# Patient Record
Sex: Female | Born: 1980
Health system: Southern US, Community
[De-identification: ages and names within clinical notes are randomized; demographics above are authoritative.]

## PROBLEM LIST (undated history)

## (undated) DIAGNOSIS — N200 Calculus of kidney: Secondary | ICD-10-CM

## (undated) DIAGNOSIS — K219 Gastro-esophageal reflux disease without esophagitis: Secondary | ICD-10-CM

## (undated) DIAGNOSIS — T4145XA Adverse effect of unspecified anesthetic, initial encounter: Secondary | ICD-10-CM

## (undated) DIAGNOSIS — Z86010 Personal history of colonic polyps: Secondary | ICD-10-CM

## (undated) DIAGNOSIS — Z800 Family history of malignant neoplasm of digestive organs: Secondary | ICD-10-CM

## (undated) DIAGNOSIS — Z9889 Other specified postprocedural states: Secondary | ICD-10-CM

## (undated) DIAGNOSIS — T8859XA Other complications of anesthesia, initial encounter: Secondary | ICD-10-CM

## (undated) DIAGNOSIS — M797 Fibromyalgia: Secondary | ICD-10-CM

## (undated) DIAGNOSIS — E538 Deficiency of other specified B group vitamins: Secondary | ICD-10-CM

## (undated) DIAGNOSIS — F988 Other specified behavioral and emotional disorders with onset usually occurring in childhood and adolescence: Secondary | ICD-10-CM

## (undated) DIAGNOSIS — R112 Nausea with vomiting, unspecified: Secondary | ICD-10-CM

## (undated) DIAGNOSIS — R5383 Other fatigue: Secondary | ICD-10-CM

## (undated) HISTORY — DX: Family history of malignant neoplasm of digestive organs: Z80.0

## (undated) HISTORY — PX: WISDOM TOOTH EXTRACTION: SHX21

## (undated) HISTORY — DX: Gastro-esophageal reflux disease without esophagitis: K21.9

## (undated) HISTORY — PX: REDUCTION MAMMAPLASTY: SUR839

## (undated) HISTORY — DX: Other fatigue: R53.83

## (undated) HISTORY — DX: Personal history of colonic polyps: Z86.010

## (undated) HISTORY — PX: ABDOMINAL HYSTERECTOMY: SHX81

## (undated) HISTORY — PX: CHOLECYSTECTOMY: SHX55

## (undated) HISTORY — DX: Deficiency of other specified B group vitamins: E53.8

## (undated) HISTORY — DX: Calculus of kidney: N20.0

## (undated) HISTORY — PX: OVARIAN CYST SURGERY: SHX726

---
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
## 1999-07-06 ENCOUNTER — Other Ambulatory Visit: Admission: RE | Admit: 1999-07-06 | Discharge: 1999-07-06 | Payer: Self-pay | Admitting: Family Medicine

## 2000-06-24 ENCOUNTER — Other Ambulatory Visit: Admission: RE | Admit: 2000-06-24 | Discharge: 2000-06-24 | Payer: Self-pay | Admitting: Family Medicine

## 2001-08-25 ENCOUNTER — Encounter: Payer: Self-pay | Admitting: Family Medicine

## 2001-08-25 LAB — CONVERTED CEMR LAB

## 2002-01-22 ENCOUNTER — Encounter (INDEPENDENT_AMBULATORY_CARE_PROVIDER_SITE_OTHER): Payer: Self-pay | Admitting: Specialist

## 2002-01-22 ENCOUNTER — Ambulatory Visit (HOSPITAL_BASED_OUTPATIENT_CLINIC_OR_DEPARTMENT_OTHER): Admission: RE | Admit: 2002-01-22 | Discharge: 2002-01-22 | Payer: Self-pay | Admitting: Plastic Surgery

## 2002-01-25 HISTORY — PX: BREAST SURGERY: SHX581

## 2003-03-21 ENCOUNTER — Other Ambulatory Visit: Admission: RE | Admit: 2003-03-21 | Discharge: 2003-03-21 | Payer: Self-pay | Admitting: Obstetrics & Gynecology

## 2003-06-27 ENCOUNTER — Emergency Department (HOSPITAL_COMMUNITY): Admission: EM | Admit: 2003-06-27 | Discharge: 2003-06-27 | Payer: Self-pay | Admitting: Emergency Medicine

## 2003-12-31 ENCOUNTER — Ambulatory Visit: Payer: Self-pay | Admitting: Family Medicine

## 2004-01-15 ENCOUNTER — Ambulatory Visit: Payer: Self-pay | Admitting: Family Medicine

## 2004-02-14 ENCOUNTER — Ambulatory Visit: Payer: Self-pay | Admitting: Family Medicine

## 2004-03-31 ENCOUNTER — Ambulatory Visit: Payer: Self-pay | Admitting: Family Medicine

## 2004-05-21 ENCOUNTER — Ambulatory Visit: Payer: Self-pay | Admitting: Family Medicine

## 2004-06-23 ENCOUNTER — Ambulatory Visit: Payer: Self-pay | Admitting: Internal Medicine

## 2004-07-23 ENCOUNTER — Ambulatory Visit: Payer: Self-pay | Admitting: Family Medicine

## 2004-08-17 ENCOUNTER — Ambulatory Visit: Payer: Self-pay | Admitting: Family Medicine

## 2004-10-15 ENCOUNTER — Other Ambulatory Visit: Admission: RE | Admit: 2004-10-15 | Discharge: 2004-10-15 | Payer: Self-pay | Admitting: Internal Medicine

## 2004-10-15 ENCOUNTER — Ambulatory Visit: Payer: Self-pay | Admitting: Family Medicine

## 2004-10-20 ENCOUNTER — Ambulatory Visit: Payer: Self-pay | Admitting: Family Medicine

## 2004-11-25 HISTORY — PX: LAPAROSCOPY: SHX197

## 2004-12-04 ENCOUNTER — Ambulatory Visit (HOSPITAL_COMMUNITY): Admission: RE | Admit: 2004-12-04 | Discharge: 2004-12-04 | Payer: Self-pay | Admitting: Gynecology

## 2005-01-23 ENCOUNTER — Ambulatory Visit: Payer: Self-pay | Admitting: Family Medicine

## 2005-02-22 ENCOUNTER — Ambulatory Visit: Payer: Self-pay | Admitting: Family Medicine

## 2005-03-08 ENCOUNTER — Ambulatory Visit: Payer: Self-pay | Admitting: Family Medicine

## 2005-05-17 ENCOUNTER — Ambulatory Visit: Payer: Self-pay | Admitting: Family Medicine

## 2005-05-31 ENCOUNTER — Ambulatory Visit: Payer: Self-pay | Admitting: Family Medicine

## 2005-09-07 ENCOUNTER — Ambulatory Visit: Payer: Self-pay | Admitting: Family Medicine

## 2005-11-10 ENCOUNTER — Ambulatory Visit: Payer: Self-pay | Admitting: Family Medicine

## 2006-01-25 HISTORY — PX: TUBAL LIGATION: SHX77

## 2006-01-31 ENCOUNTER — Ambulatory Visit: Payer: Self-pay | Admitting: Family Medicine

## 2006-02-25 ENCOUNTER — Ambulatory Visit: Payer: Self-pay | Admitting: Family Medicine

## 2006-03-15 ENCOUNTER — Ambulatory Visit: Payer: Self-pay | Admitting: Family Medicine

## 2006-03-15 ENCOUNTER — Encounter: Payer: Self-pay | Admitting: Family Medicine

## 2006-03-23 ENCOUNTER — Ambulatory Visit: Payer: Self-pay | Admitting: Family Medicine

## 2006-03-24 ENCOUNTER — Encounter: Payer: Self-pay | Admitting: Family Medicine

## 2006-03-30 ENCOUNTER — Ambulatory Visit: Payer: Self-pay | Admitting: Family Medicine

## 2006-04-02 ENCOUNTER — Ambulatory Visit: Payer: Self-pay | Admitting: Family Medicine

## 2006-04-26 LAB — HM COLONOSCOPY

## 2006-04-27 ENCOUNTER — Encounter: Payer: Self-pay | Admitting: Family Medicine

## 2006-04-27 DIAGNOSIS — N809 Endometriosis, unspecified: Secondary | ICD-10-CM | POA: Insufficient documentation

## 2006-04-27 DIAGNOSIS — F17200 Nicotine dependence, unspecified, uncomplicated: Secondary | ICD-10-CM | POA: Insufficient documentation

## 2006-04-27 DIAGNOSIS — R1013 Epigastric pain: Secondary | ICD-10-CM

## 2006-04-27 DIAGNOSIS — G43909 Migraine, unspecified, not intractable, without status migrainosus: Secondary | ICD-10-CM | POA: Insufficient documentation

## 2006-04-27 DIAGNOSIS — Z87442 Personal history of urinary calculi: Secondary | ICD-10-CM | POA: Insufficient documentation

## 2006-04-27 DIAGNOSIS — Z87898 Personal history of other specified conditions: Secondary | ICD-10-CM | POA: Insufficient documentation

## 2006-04-27 DIAGNOSIS — K3189 Other diseases of stomach and duodenum: Secondary | ICD-10-CM | POA: Insufficient documentation

## 2006-04-27 DIAGNOSIS — K219 Gastro-esophageal reflux disease without esophagitis: Secondary | ICD-10-CM | POA: Insufficient documentation

## 2006-05-02 ENCOUNTER — Ambulatory Visit: Payer: Self-pay | Admitting: Unknown Physician Specialty

## 2006-05-02 ENCOUNTER — Encounter: Payer: Self-pay | Admitting: Gastroenterology

## 2006-05-16 ENCOUNTER — Encounter: Payer: Self-pay | Admitting: Family Medicine

## 2006-06-01 DIAGNOSIS — Z860100 Personal history of colon polyps, unspecified: Secondary | ICD-10-CM

## 2006-06-01 HISTORY — DX: Personal history of colon polyps, unspecified: Z86.0100

## 2006-06-01 HISTORY — DX: Personal history of colonic polyps: Z86.010

## 2006-06-13 ENCOUNTER — Ambulatory Visit: Payer: Self-pay | Admitting: Family Medicine

## 2006-08-12 ENCOUNTER — Telehealth: Payer: Self-pay | Admitting: Family Medicine

## 2006-08-15 ENCOUNTER — Ambulatory Visit: Payer: Self-pay | Admitting: Gynecology

## 2006-08-16 ENCOUNTER — Ambulatory Visit (HOSPITAL_COMMUNITY): Admission: RE | Admit: 2006-08-16 | Discharge: 2006-08-16 | Payer: Self-pay | Admitting: Gynecology

## 2006-10-17 ENCOUNTER — Ambulatory Visit: Payer: Self-pay | Admitting: Gynecology

## 2006-10-26 ENCOUNTER — Ambulatory Visit: Payer: Self-pay | Admitting: Family Medicine

## 2006-10-27 LAB — CONVERTED CEMR LAB
ALT: 12 units/L (ref 0–35)
Albumin: 3.7 g/dL (ref 3.5–5.2)
Alkaline Phosphatase: 27 units/L — ABNORMAL LOW (ref 39–117)
BUN: 6 mg/dL (ref 6–23)
Basophils Relative: 0.3 % (ref 0.0–1.0)
CO2: 27 meq/L (ref 19–32)
Calcium: 9 mg/dL (ref 8.4–10.5)
Chloride: 107 meq/L (ref 96–112)
Creatinine, Ser: 0.7 mg/dL (ref 0.4–1.2)
Eosinophils Relative: 0.6 % (ref 0.0–5.0)
Folate: 8.7 ng/mL
HCT: 35.4 % — ABNORMAL LOW (ref 36.0–46.0)
MCV: 87 fL (ref 78.0–100.0)
Neutrophils Relative %: 67.6 % (ref 43.0–77.0)
Platelets: 238 10*3/uL (ref 150–400)
RBC: 4.07 M/uL (ref 3.87–5.11)
RDW: 12.1 % (ref 11.5–14.6)
Total Bilirubin: 1 mg/dL (ref 0.3–1.2)
Total Protein: 6.9 g/dL (ref 6.0–8.3)
WBC: 5.8 10*3/uL (ref 4.5–10.5)

## 2006-10-28 ENCOUNTER — Ambulatory Visit: Payer: Self-pay | Admitting: Family Medicine

## 2006-10-28 DIAGNOSIS — E538 Deficiency of other specified B group vitamins: Secondary | ICD-10-CM | POA: Insufficient documentation

## 2006-10-31 ENCOUNTER — Ambulatory Visit: Payer: Self-pay | Admitting: Family Medicine

## 2006-10-31 LAB — CONVERTED CEMR LAB
Bilirubin Urine: NEGATIVE
Blood in Urine, dipstick: NEGATIVE
Nitrite: NEGATIVE
Protein, U semiquant: NEGATIVE
Specific Gravity, Urine: <1.005
WBC Urine, dipstick: NEGATIVE

## 2006-11-01 LAB — CONVERTED CEMR LAB
Basophils Relative: 1 % (ref 0.0–1.0)
Eosinophils Absolute: 0 10*3/uL (ref 0.0–0.6)
Eosinophils Relative: 0.9 % (ref 0.0–5.0)
Hemoglobin: 12.5 g/dL (ref 12.0–15.0)
Lymphocytes Relative: 34.4 % (ref 12.0–46.0)
MCV: 87 fL (ref 78.0–100.0)
Monocytes Absolute: 0.6 10*3/uL (ref 0.2–0.7)
Monocytes Relative: 14.9 % — ABNORMAL HIGH (ref 3.0–11.0)
Neutro Abs: 2 10*3/uL (ref 1.4–7.7)
Platelets: 207 10*3/uL (ref 150–400)
WBC: 4 10*3/uL — ABNORMAL LOW (ref 4.5–10.5)

## 2006-11-02 ENCOUNTER — Encounter: Payer: Self-pay | Admitting: Family Medicine

## 2006-11-04 ENCOUNTER — Ambulatory Visit: Payer: Self-pay | Admitting: Gynecology

## 2006-11-04 ENCOUNTER — Ambulatory Visit (HOSPITAL_COMMUNITY): Admission: RE | Admit: 2006-11-04 | Discharge: 2006-11-04 | Payer: Self-pay | Admitting: Gynecology

## 2006-11-07 ENCOUNTER — Inpatient Hospital Stay (HOSPITAL_COMMUNITY): Admission: AD | Admit: 2006-11-07 | Discharge: 2006-11-07 | Payer: Self-pay | Admitting: Obstetrics & Gynecology

## 2006-12-05 ENCOUNTER — Ambulatory Visit: Payer: Self-pay | Admitting: Family Medicine

## 2006-12-08 ENCOUNTER — Ambulatory Visit: Payer: Self-pay | Admitting: Obstetrics & Gynecology

## 2006-12-09 ENCOUNTER — Telehealth: Payer: Self-pay | Admitting: Family Medicine

## 2007-01-04 ENCOUNTER — Ambulatory Visit: Payer: Self-pay | Admitting: Family Medicine

## 2007-01-31 ENCOUNTER — Ambulatory Visit: Payer: Self-pay | Admitting: Family Medicine

## 2007-01-31 DIAGNOSIS — R350 Frequency of micturition: Secondary | ICD-10-CM | POA: Insufficient documentation

## 2007-02-07 ENCOUNTER — Ambulatory Visit: Payer: Self-pay | Admitting: Family Medicine

## 2007-02-07 ENCOUNTER — Telehealth: Payer: Self-pay | Admitting: Family Medicine

## 2007-02-08 ENCOUNTER — Ambulatory Visit: Payer: Self-pay | Admitting: Family Medicine

## 2007-02-27 ENCOUNTER — Ambulatory Visit: Payer: Self-pay | Admitting: Gynecology

## 2007-02-27 ENCOUNTER — Encounter: Payer: Self-pay | Admitting: Family Medicine

## 2007-02-27 ENCOUNTER — Encounter (INDEPENDENT_AMBULATORY_CARE_PROVIDER_SITE_OTHER): Payer: Self-pay | Admitting: Gynecology

## 2007-02-28 ENCOUNTER — Ambulatory Visit: Payer: Self-pay | Admitting: Family Medicine

## 2007-02-28 LAB — CONVERTED CEMR LAB: Rapid Strep: NEGATIVE

## 2007-03-01 ENCOUNTER — Encounter: Payer: Self-pay | Admitting: Family Medicine

## 2007-03-02 LAB — CONVERTED CEMR LAB
Basophils Absolute: 0 10*3/uL (ref 0.0–0.1)
Eosinophils Absolute: 0 10*3/uL (ref 0.0–0.6)
Hemoglobin: 12.6 g/dL (ref 12.0–15.0)
Lymphocytes Relative: 3.9 % — ABNORMAL LOW (ref 12.0–46.0)
MCHC: 33.9 g/dL (ref 30.0–36.0)
Mono Screen: NEGATIVE
Monocytes Absolute: 0.4 10*3/uL (ref 0.2–0.7)
Monocytes Relative: 0.9 % — ABNORMAL LOW (ref 3.0–11.0)
Neutro Abs: 12.8 10*3/uL — ABNORMAL HIGH (ref 1.4–7.7)
Platelets: 182 10*3/uL (ref 150–400)

## 2007-03-09 ENCOUNTER — Ambulatory Visit: Payer: Self-pay | Admitting: Family Medicine

## 2007-03-20 ENCOUNTER — Ambulatory Visit: Payer: Self-pay | Admitting: Family Medicine

## 2007-04-20 ENCOUNTER — Ambulatory Visit: Payer: Self-pay | Admitting: Family Medicine

## 2007-04-21 LAB — CONVERTED CEMR LAB
Chloride: 105 meq/L (ref 96–112)
Creatinine, Ser: 0.6 mg/dL (ref 0.4–1.2)
Eosinophils Relative: 0.9 % (ref 0.0–5.0)
Folate: 6.8 ng/mL
GFR calc Af Amer: 152 mL/min
GFR calc non Af Amer: 126 mL/min
HCT: 38.4 % (ref 36.0–46.0)
Lymphocytes Relative: 31.3 % (ref 12.0–46.0)
Monocytes Absolute: 0.5 10*3/uL (ref 0.2–0.7)
Monocytes Relative: 7.8 % (ref 3.0–11.0)
Neutrophils Relative %: 59.2 % (ref 43.0–77.0)
Phosphorus: 3.2 mg/dL (ref 2.3–4.6)
Platelets: 258 10*3/uL (ref 150–400)
RDW: 13.1 % (ref 11.5–14.6)
Sodium: 135 meq/L (ref 135–145)
Transferrin: 365.9 mg/dL — ABNORMAL HIGH (ref >=212.0)
Vitamin B-12: >1500 pg/mL — ABNORMAL HIGH (ref 211–911)
WBC: 6 10*3/uL (ref 4.5–10.5)

## 2007-05-24 ENCOUNTER — Ambulatory Visit: Payer: Self-pay | Admitting: Family Medicine

## 2007-05-31 ENCOUNTER — Telehealth: Payer: Self-pay | Admitting: Family Medicine

## 2007-06-23 ENCOUNTER — Ambulatory Visit: Payer: Self-pay | Admitting: Family Medicine

## 2007-07-26 ENCOUNTER — Ambulatory Visit: Payer: Self-pay | Admitting: Family Medicine

## 2007-08-29 ENCOUNTER — Ambulatory Visit: Payer: Self-pay | Admitting: Family Medicine

## 2007-09-14 ENCOUNTER — Ambulatory Visit: Payer: Self-pay | Admitting: Family Medicine

## 2007-09-15 LAB — CONVERTED CEMR LAB
Folate: 10.6 ng/mL
Hemoglobin: 13.1 g/dL (ref 12.0–15.0)

## 2007-10-12 ENCOUNTER — Emergency Department (HOSPITAL_COMMUNITY): Admission: EM | Admit: 2007-10-12 | Discharge: 2007-10-12 | Payer: Self-pay | Admitting: Family Medicine

## 2007-10-14 ENCOUNTER — Telehealth (INDEPENDENT_AMBULATORY_CARE_PROVIDER_SITE_OTHER): Payer: Self-pay | Admitting: *Deleted

## 2007-10-14 ENCOUNTER — Ambulatory Visit: Payer: Self-pay | Admitting: Family Medicine

## 2007-10-16 ENCOUNTER — Encounter: Payer: Self-pay | Admitting: Family Medicine

## 2007-11-17 ENCOUNTER — Ambulatory Visit: Payer: Self-pay | Admitting: Family Medicine

## 2007-11-17 ENCOUNTER — Telehealth: Payer: Self-pay | Admitting: Family Medicine

## 2007-11-21 LAB — CONVERTED CEMR LAB
BUN: 8 mg/dL (ref 6–23)
CO2: 25 meq/L (ref 19–32)
Chloride: 103 meq/L (ref 96–112)
Creatinine, Ser: 0.56 mg/dL (ref 0.40–1.20)
HCT: 37.3 % (ref 36.0–46.0)
Hemoglobin: 12 g/dL (ref 12.0–15.0)
Lymphocytes Relative: 31 % (ref 12–46)
MCHC: 32.2 g/dL (ref 30.0–36.0)
Monocytes Absolute: 0.8 10*3/uL (ref 0.1–1.0)
Monocytes Relative: 11 % (ref 3–12)
Neutro Abs: 3.9 10*3/uL (ref 1.7–7.7)
RBC: 4.21 M/uL (ref 3.87–5.11)

## 2007-12-28 ENCOUNTER — Telehealth (INDEPENDENT_AMBULATORY_CARE_PROVIDER_SITE_OTHER): Payer: Self-pay | Admitting: *Deleted

## 2008-02-06 ENCOUNTER — Ambulatory Visit: Payer: Self-pay | Admitting: Family Medicine

## 2008-02-07 LAB — CONVERTED CEMR LAB
Alkaline Phosphatase: 38 units/L — ABNORMAL LOW (ref 39–117)
Bilirubin, Direct: 0.1 mg/dL (ref 0.0–0.3)
LDL Cholesterol: 98 mg/dL (ref 0–99)
Total Bilirubin: 0.8 mg/dL (ref 0.3–1.2)
Total CHOL/HDL Ratio: 2.8

## 2008-02-14 ENCOUNTER — Ambulatory Visit: Payer: Self-pay | Admitting: Family Medicine

## 2008-02-15 ENCOUNTER — Encounter: Payer: Self-pay | Admitting: Family Medicine

## 2008-02-15 LAB — CONVERTED CEMR LAB
Clue Cells Wet Prep HPF POC: NONE SEEN
GC Probe Amp, Genital: NEGATIVE
WBC, Wet Prep HPF POC: NONE SEEN

## 2008-02-28 ENCOUNTER — Telehealth: Payer: Self-pay | Admitting: Family Medicine

## 2008-02-28 ENCOUNTER — Ambulatory Visit: Payer: Self-pay | Admitting: Family Medicine

## 2008-02-28 ENCOUNTER — Encounter: Payer: Self-pay | Admitting: Family Medicine

## 2008-03-07 ENCOUNTER — Telehealth (INDEPENDENT_AMBULATORY_CARE_PROVIDER_SITE_OTHER): Payer: Self-pay | Admitting: *Deleted

## 2008-03-09 ENCOUNTER — Ambulatory Visit: Payer: Self-pay | Admitting: Family Medicine

## 2008-03-09 DIAGNOSIS — J309 Allergic rhinitis, unspecified: Secondary | ICD-10-CM | POA: Insufficient documentation

## 2008-03-15 ENCOUNTER — Telehealth: Payer: Self-pay | Admitting: Family Medicine

## 2008-03-22 ENCOUNTER — Ambulatory Visit: Payer: Self-pay | Admitting: Internal Medicine

## 2008-04-11 ENCOUNTER — Telehealth: Payer: Self-pay | Admitting: Family Medicine

## 2008-04-19 ENCOUNTER — Encounter: Payer: Self-pay | Admitting: Family Medicine

## 2008-05-06 ENCOUNTER — Ambulatory Visit: Payer: Self-pay | Admitting: Family Medicine

## 2008-05-06 LAB — CONVERTED CEMR LAB: TSH: 2.518 microintl units/mL (ref 0.350–4.500)

## 2008-05-09 ENCOUNTER — Ambulatory Visit (HOSPITAL_COMMUNITY): Admission: RE | Admit: 2008-05-09 | Discharge: 2008-05-09 | Payer: Self-pay | Admitting: Obstetrics & Gynecology

## 2008-05-15 ENCOUNTER — Telehealth: Payer: Self-pay | Admitting: Family Medicine

## 2008-05-15 ENCOUNTER — Inpatient Hospital Stay (HOSPITAL_COMMUNITY): Admission: AD | Admit: 2008-05-15 | Discharge: 2008-05-15 | Payer: Self-pay | Admitting: Obstetrics & Gynecology

## 2008-05-20 ENCOUNTER — Encounter: Admission: RE | Admit: 2008-05-20 | Discharge: 2008-05-20 | Payer: Self-pay | Admitting: Family Medicine

## 2008-06-05 ENCOUNTER — Ambulatory Visit: Payer: Self-pay | Admitting: Obstetrics & Gynecology

## 2008-06-06 ENCOUNTER — Ambulatory Visit: Payer: Self-pay | Admitting: Family Medicine

## 2008-08-14 ENCOUNTER — Ambulatory Visit: Payer: Self-pay | Admitting: Family Medicine

## 2008-08-14 ENCOUNTER — Telehealth (INDEPENDENT_AMBULATORY_CARE_PROVIDER_SITE_OTHER): Payer: Self-pay | Admitting: *Deleted

## 2008-08-19 LAB — CONVERTED CEMR LAB
BUN: 10 mg/dL (ref 6–23)
Basophils Absolute: 0 10*3/uL (ref 0.0–0.1)
Calcium: 9.3 mg/dL (ref 8.4–10.5)
Creatinine, Ser: 0.7 mg/dL (ref 0.4–1.2)
Eosinophils Absolute: 0.1 10*3/uL (ref 0.0–0.7)
GFR calc non Af Amer: 104.11 mL/min (ref >60)
Hemoglobin: 12.5 g/dL (ref 12.0–15.0)
Lymphocytes Relative: 22.1 % (ref 12.0–46.0)
MCHC: 33.9 g/dL (ref 30.0–36.0)
Neutro Abs: 5.7 10*3/uL (ref 1.4–7.7)
Potassium: 3.9 meq/L (ref 3.5–5.1)
RDW: 13 % (ref 11.5–14.6)
Vitamin B-12: >1500 pg/mL — ABNORMAL HIGH (ref 211–911)

## 2008-08-22 ENCOUNTER — Ambulatory Visit (HOSPITAL_COMMUNITY): Admission: RE | Admit: 2008-08-22 | Discharge: 2008-08-22 | Payer: Self-pay | Admitting: Family Medicine

## 2008-11-12 ENCOUNTER — Ambulatory Visit: Payer: Self-pay | Admitting: Family Medicine

## 2008-12-06 ENCOUNTER — Telehealth: Payer: Self-pay | Admitting: Family Medicine

## 2009-02-07 ENCOUNTER — Ambulatory Visit: Payer: Self-pay | Admitting: Family Medicine

## 2009-03-05 ENCOUNTER — Ambulatory Visit: Payer: Self-pay | Admitting: Family Medicine

## 2009-05-06 ENCOUNTER — Telehealth: Payer: Self-pay | Admitting: Family Medicine

## 2009-05-08 ENCOUNTER — Telehealth: Payer: Self-pay | Admitting: Family Medicine

## 2009-05-19 ENCOUNTER — Ambulatory Visit: Payer: Self-pay | Admitting: Family Medicine

## 2009-07-31 ENCOUNTER — Inpatient Hospital Stay (HOSPITAL_COMMUNITY): Admission: AD | Admit: 2009-07-31 | Discharge: 2009-07-31 | Payer: Self-pay | Admitting: Obstetrics & Gynecology

## 2009-11-14 ENCOUNTER — Encounter: Payer: Self-pay | Admitting: Family Medicine

## 2009-11-15 ENCOUNTER — Emergency Department (HOSPITAL_COMMUNITY): Admission: EM | Admit: 2009-11-15 | Discharge: 2009-11-15 | Payer: Self-pay | Admitting: Emergency Medicine

## 2009-11-15 ENCOUNTER — Encounter: Payer: Self-pay | Admitting: Family Medicine

## 2009-11-15 ENCOUNTER — Encounter (INDEPENDENT_AMBULATORY_CARE_PROVIDER_SITE_OTHER): Payer: Self-pay | Admitting: *Deleted

## 2009-11-19 ENCOUNTER — Encounter: Payer: Self-pay | Admitting: Family Medicine

## 2009-11-19 ENCOUNTER — Telehealth: Payer: Self-pay | Admitting: Family Medicine

## 2009-11-24 ENCOUNTER — Telehealth: Payer: Self-pay | Admitting: Family Medicine

## 2009-11-26 ENCOUNTER — Ambulatory Visit: Payer: Self-pay | Admitting: Family Medicine

## 2009-12-03 ENCOUNTER — Ambulatory Visit: Payer: Self-pay | Admitting: Gastroenterology

## 2009-12-03 DIAGNOSIS — D1803 Hemangioma of intra-abdominal structures: Secondary | ICD-10-CM | POA: Insufficient documentation

## 2009-12-03 DIAGNOSIS — Z860100 Personal history of colon polyps, unspecified: Secondary | ICD-10-CM | POA: Insufficient documentation

## 2009-12-03 DIAGNOSIS — R1310 Dysphagia, unspecified: Secondary | ICD-10-CM | POA: Insufficient documentation

## 2009-12-03 DIAGNOSIS — R197 Diarrhea, unspecified: Secondary | ICD-10-CM | POA: Insufficient documentation

## 2009-12-16 ENCOUNTER — Ambulatory Visit: Payer: Self-pay | Admitting: Gastroenterology

## 2010-01-27 ENCOUNTER — Ambulatory Visit: Admit: 2010-01-27 | Payer: Self-pay | Admitting: Family Medicine

## 2010-02-10 ENCOUNTER — Ambulatory Visit
Admission: RE | Admit: 2010-02-10 | Discharge: 2010-02-10 | Payer: Self-pay | Source: Home / Self Care | Attending: Family Medicine | Admitting: Family Medicine

## 2010-02-10 ENCOUNTER — Emergency Department (HOSPITAL_COMMUNITY)
Admission: EM | Admit: 2010-02-10 | Discharge: 2010-02-10 | Payer: Self-pay | Source: Home / Self Care | Admitting: Emergency Medicine

## 2010-02-11 ENCOUNTER — Telehealth: Payer: Self-pay | Admitting: Family Medicine

## 2010-02-15 ENCOUNTER — Encounter: Payer: Self-pay | Admitting: Family Medicine

## 2010-02-26 NOTE — Progress Notes (Signed)
Summary: form for topamax  Phone Note Refill Request Message from:  Fax from Pharmacy  Refills Requested: Medication #1:  TOPAMAX 25 MG  TABS 1 by mouth two times a day Faxed form from [facility] is on your shelf.  This was called in to [facility] yesterday but pt can only get one month's supply and then she will have to go through [facility], per her insurance.  Initial call taken by: Lowella Petties CMA,  May 08, 2009 11:57 AM  Follow-up for Phone Call        form done and in nurse in box   Follow-up by: Judith Part MD,  May 08, 2009 12:28 PM  Additional Follow-up for Phone Call Additional follow up Details #1::        Completed form faxed to [phone] as instructed. Form is at my desk if needed later.Lewanda Rife LPN  May 08, 2009 12:53 PM     Prescriptions: TOPAMAX 25 MG  TABS (TOPIRAMATE) 1 by mouth two times a day  #180 x 3   Entered and Authorized by:   Judith Part MD   Signed by:   Lewanda Rife LPN on 09/81/1914   Method used:   Historical   RxID:   [id number]

## 2010-02-26 NOTE — Procedures (Signed)
Summary: [facility]  [facility]   Imported By: Lester Collins 12/15/2009 09:30:46  _____________________________________________________________________  External Attachment:    Type:   Image     Comment:   External Document

## 2010-02-26 NOTE — Assessment & Plan Note (Signed)
Summary: feel dehydrated, blisters in mouth, sore throat/jbb   Vital Signs:  Patient Profile:   30 Years Old Female CC:      Cold & URI symptoms Height:     63 inches (160.66 cm) Weight:      151 pounds BMI:     26.85 O2 Sat:      100 % O2 treatment:    Room Air Temp:     97.6 degrees F oral Pulse rate:   100 / minute Pulse rhythm:   regular Resp:     20 per minute BP sitting:   135 / 85  (right arm)  Pt. in pain?   no  Vitals Entered By: Levonne Spiller EMT-P (February 10, 2010 12:35 PM)              Is Patient Diabetic? No  Does patient need assistance? Functional Status Self care Ambulation Normal Comments Pt. is a smoker. Half pack per day.     Serial Vital Signs/Assessments:  Time      Position  BP       Pulse  Resp  Temp     By 12:41               121/76   86                    Levonne Spiller EMT-P 12:45               191/47   82                    Levonne Spiller EMT-P 12:49               956/21   111                   Levonne Spiller EMT-P   Current Allergies: ! PENICILLIN ! * CLINDAMYCINHistory of Present Illness History from: patient Reason for visit: see chief complaint Chief Complaint: Cold & URI symptoms History of Present Illness: The patient presented today because she has been having loose stools and nausea for the last several days.  Her daughter ran away from home 4 days ago.  She says that she has been acutely upset and having anxiety.  She is not eating well and having nausea. she says that she is concerned that she may have become dehydrated because she has been dizzy at times.  She says that she has no palpatations.  She has no SOB or syncopal episodes.  She is drinking small amounts and keeping it down.  She says that she ate a very small breakfast this am.  She has had a BTL and does not believe she could be pregnant.  She says that she cannot take phenergan or zofran because they cause excess sedation and intolerance.  She has had some mild sore throat  and loose stools.  Some breakout of blisters in the mouth, otherwise no other symptoms.    REVIEW OF SYSTEMS Constitutional Symptoms       Complains of weight loss.     Denies fever, chills, night sweats, weight gain, and fatigue.  Eyes       Denies change in vision, eye pain, eye discharge, glasses, contact lenses, and eye surgery. Ear/Nose/Throat/Mouth       Complains of sore throat and hoarseness.      Denies hearing loss/aids, change in hearing, ear pain, ear discharge, dizziness, frequent runny nose, frequent nose bleeds,  sinus problems, and tooth pain or bleeding.  Respiratory       Denies dry cough, productive cough, wheezing, shortness of breath, asthma, bronchitis, and emphysema/COPD.  Cardiovascular       Denies murmurs, chest pain, and tires easily with exhertion.    Gastrointestinal       Complains of nausea/vomiting and diarrhea.      Denies stomach pain, constipation, blood in bowel movements, and indigestion.      Comments: Diarrhea 5 or 6 times per day. Genitourniary       Denies painful urination, blood or discharge from vagina, kidney stones, and loss of urinary control. Neurological       Denies paralysis, seizures, and fainting/blackouts. Musculoskeletal       Denies muscle pain, joint pain, joint stiffness, decreased range of motion, redness, swelling, muscle weakness, and gout.  Skin       Denies bruising, unusual mles/lumps or sores, and hair/skin or nail changes.  Psych       Complains of anxiety/stress.      Denies mood changes, temper/anger issues, speech problems, depression, and sleep problems. Other Comments: Pt. stated that she is under a lot of stress due to home personal problems! Pt. complains of possible dehydration, sleeping problems.   Past History:  Family History: Last updated: 02/06/2008 family hx of cirrhosis of liver- successful transplant)- no etoh or hepatitis, HTN mother cervical ca, kidney ca, MS, endometriosis, HTN, DM  brother - died  in MVA, kidney failure  sister - colon polyps - ? cancerous  GF with colon cancer , died of brain aneurysm GM mat- breast ca great aunts M with breast cancer   fam hx of ? wilson's dz -- father may have had it   Social History: Last updated: 02/10/2010 quit smoking 4/07 Patient is a former smoker.  much stress caring for father with liver failure rare alcohol  Occupation: Customer Service Illicit Drug Use - no Pt's 76 y/o daughter ran away from home (1/12) to live with her father in [place].  An impending legal battle has ensued.    Risk Factors: Smoking Status: quit (04/27/2006)  Past Medical History: Reviewed history from 02/06/2008 and no changes required. GERD, HH  endometriosis fatigue smoking hx kidney stone  recurrent OE  ENT-- [facility]  urologist - [facility]  GYN - [facility]   Past Surgical History: Reviewed history from 12/03/2009 and no changes required. Cholecystectomy Ovarian cysts Laparoscopy (11/2004)- endometriosis colonosc 4/08- polyp EGD 4/08 nl HH  abd [place] 10/11 -  ? hemangioma of liver  Breast reduction 2004  Family History: Reviewed history from 02/06/2008 and no changes required. family hx of cirrhosis of liver- successful transplant)- no etoh or hepatitis, HTN mother cervical ca, kidney ca, MS, endometriosis, HTN, DM  brother - died in MVA, kidney failure  sister - colon polyps - ? cancerous  GF with colon cancer , died of brain aneurysm GM mat- breast ca great aunts M with breast cancer   fam hx of ? wilson's dz -- father may have had it   Social History: quit smoking 4/07 Patient is a former smoker.  much stress caring for father with liver failure rare alcohol  Occupation: Customer Service Illicit Drug Use - no Pt's 61 y/o daughter ran away from home (1/12) to live with her father in [place].  An impending legal battle has ensued.   Physical Exam General appearance: well developed, well nourished, no acute distress, slightly anxious  appearing.  Head: normocephalic,  atraumatic Eyes: conjunctivae and lids normal Pupils: equal, round, reactive to light Ears: normal, no lesions or deformities Nasal: mucosa pink, nonedematous, no septal deviation, turbinates normal Oral/Pharynx: tongue normal, posterior pharynx without erythema or exudate Neck: neck supple,  trachea midline, no masses Chest/Lungs: no rales, wheezes, or rhonchi bilateral, breath sounds equal without effort Heart: regular rate and  rhythm, no murmur Abdomen: soft, non-tender without obvious organomegaly Extremities: normal extremities Neurological: grossly intact and non-focal Skin: no obvious rashes or lesions MSE: oriented to time, place, and person Assessment  Assessed DIARRHEA as deteriorated - Standley Dakins MD Assessed DYSPHAGIA UNSPECIFIED as deteriorated - Standley Dakins MD Assessed DYSPEPSIA as deteriorated - Standley Dakins MD Assessed GERD as deteriorated - Clanford Johnson MD New Problems: DEHYDRATION (ICD-276.51)   Patient Education: Patient and/or caregiver instructed in the following: rest, fluids, quit smoking. The risks, benefits and possible side effects were clearly explained and discussed with the patient.  The patient verbalized clear understanding.  The patient was given instructions to return if symptoms don't improve, worsen or new changes develop.  If it is not during clinic hours and the patient cannot get back to this clinic then the patient was told to seek medical care at an available urgent care or emergency department.  The patient verbalized understanding.   Demonstrates willingness to comply.  Plan Planning Comments:   Pt was sent to the Emergency Department for IV Fluids.  Pt said that she preferred to go to [facility] ER for treatment.  She was given some oral fluids in the office and she was able to tolerate small sips.  She was monitored to be sure she was hemodynamically stable which she was and sent over to  the ER for fluids.  Pt declined to get any medications for nausea/vomiting because of her history of not being able to tolerate phenergan and zofran.   Follow Up: Follow up in 2-3 days if no improvement, Follow up on an as needed basis, Follow up with Primary Physician  The patient and/or caregiver has been counseled thoroughly with regard to medications prescribed including dosage, schedule, interactions, rationale for use, and possible side effects and they verbalize understanding.  Diagnoses and expected course of recovery discussed and will return if not improved as expected or if the condition worsens. Patient and/or caregiver verbalized understanding.   Patient Instructions: 1)  Go to [facility] ER at this time and be evaluated for clinical dehydration and anxiety disorder.   2)  Please see your primary care physician in the next week for follow up of your condition.  3)  Consider taking Prilosec OTC for the next 2 weeks to control your acid reflux symptoms.   4)  The patient was informed that there is no on-call provider or services available at this clinic during off-hours (when the clinic is closed).  If the patient developed a problem or concern that required immediate attention, the patient was advised to go the the nearest available urgent care or emergency department for medical care.  The patient verbalized understanding.    5)  It was clearly explained to the patient that this [facility] is not intended to be a primary care clinic.  The patient is always better served by the continuity of care and the provider/patient relationships developed with their dedicated primary care provider.  The patient was told to be sure to follow up as soon as possible with their primary care provider to discuss treatments received and to receive further  examination and testing.  The patient verbalized understanding. The will f/u with PCP ASAP.

## 2010-02-26 NOTE — Letter (Signed)
Summary: EGD Instructions  [facility]  [address] [place], [place] [address]   Phone: [phone]  Fax: [phone]       [name]    08-02-1980    MRN: [id number]       Procedure Day /Date:TUESDAY 12/16/2009     Arrival Time: 7:30AM     Procedure Time:8:30AM     Location of Procedure:                     [facility] 4TH FLOOR    PREPARATION FOR ENDOSCOPY/DIL   On 12/16/2009  THE DAY OF THE PROCEDURE:  1.   No solid foods, milk or milk products are allowed after midnight the night before your procedure.  2.   Do not drink anything colored red or purple.  Avoid juices with pulp.  No orange juice.  3.  You may drink clear liquids until6:30AM, which is 2 hours before your procedure.                                                                                                CLEAR LIQUIDS INCLUDE: Water Jello Ice Popsicles Tea (sugar ok, no milk/cream) Powdered fruit flavored drinks Coffee (sugar ok, no milk/cream) Gatorade Juice: apple, white grape, white cranberry  Lemonade Clear bullion, consomm, broth Carbonated beverages (any kind) Strained chicken noodle soup Hard Candy   MEDICATION INSTRUCTIONS  Unless otherwise instructed, you should take regular prescription medications with a small sip of water as early as possible the morning of your procedure.            OTHER INSTRUCTIONS  You will need a responsible adult at least 32 years of age to accompany you and drive you home.   This person must remain in the waiting room during your procedure.  Wear loose fitting clothing that is easily removed.  Leave jewelry and other valuables at home.  However, you may wish to bring a book to read or an iPod/MP3 player to listen to music as you wait for your procedure to start.  Remove all body piercing jewelry and leave at home.  Total time from sign-in until discharge is approximately 2-3 hours.  You should go home  directly after your procedure and rest.  You can resume normal activities the day after your procedure.  The day of your procedure you should not:   Drive   Make legal decisions   Operate machinery   Drink alcohol   Return to work  You will receive specific instructions about eating, activities and medications before you leave.    The above instructions have been reviewed and explained to me by   _______________________    I fully understand and can verbalize these instructions _____________________________ Date _________

## 2010-02-26 NOTE — Letter (Signed)
Summary: Results Letter  [facility]  [address] [place], [place] [address]   Phone: [phone]  Fax: [phone]        December 03, 2009 MRN: [id number]    [facility] [address] [place], [place]  [address]    Dear Ms. Zalesky,  It is my pleasure to have treated you recently as a new patient in my office. I appreciate your confidence and the opportunity to participate in your care.  Since I do have a busy inpatient endoscopy schedule and office schedule, my office hours vary weekly. I am, however, available for emergency calls everyday through my office. If I am not available for an urgent office appointment, another one of our gastroenterologist will be able to assist you.  My well-trained staff are prepared to help you at all times. For emergencies after office hours, a physician from our Gastroenterology section is always available through my 24 hour answering service  Once again I welcome you as a new patient and I look forward to a happy and healthy relationship             Sincerely,  Louis Meckel MD  This letter has been electronically signed by your physician.  Appended Document: Results Letter LETTER MAILED

## 2010-02-26 NOTE — Letter (Signed)
Summary: Out of Work  [facility]  [address] [place], [place] [address]   Phone: [phone]  Fax: [phone]    December 16, 2009   Employee:  CHEROLYN BEHRLE    To Whom It May Concern:   For Medical reasons, please excuse the above named employee from work for the following dates:  Start:   12/16/09  End:   12/17/09  If you need additional information, please feel free to contact our office.         Sincerely,    Weston Brass RN

## 2010-02-26 NOTE — Procedures (Signed)
Summary: [facility]  [facility]   Imported By: Lester N[place]et 12/15/2009 09:32:15  _____________________________________________________________________  External Attachment:    Type:   Image     Comment:   External Document

## 2010-02-26 NOTE — Procedures (Signed)
Summary: Upper Endoscopy  Patient: [name] Note: All result statuses are Final unless otherwise noted.  Tests: (1) Upper Endoscopy (EGD)   EGD Upper Endoscopy       DONE     [facility]     [address]     [place], [place]  [address]           ENDOSCOPY PROCEDURE REPORT           PATIENT:  [name], [name]  MR#:  [id number]     BIRTHDATE:  Nov 12, 1980, 29 yrs. old  GENDER:  female           ENDOSCOPIST:  Barbette Hair. Arlyce Dice, MD     Referred by:  Marne A. Milinda Antis, M.D.           PROCEDURE DATE:  12/16/2009     PROCEDURE:  EGD, diagnostic 43235, Maloney Dilation of Esophagus     ASA CLASS:  Class I     INDICATIONS:  dysphagia           MEDICATIONS:   Fentanyl 75 mcg IV, Versed 8 mg IV, glycopyrrolate     (Robinal) 0.2 mg IV, 0.6cc simethancone 0.6 cc PO     TOPICAL ANESTHETIC:  Cetacaine Spray           DESCRIPTION OF PROCEDURE:   After the risks benefits and     alternatives of the procedure were thoroughly explained, informed     consent was obtained.  The LB GIF-H180 [id number] endoscope was     introduced through the mouth and advanced to the third portion of     the duodenum, without limitations.  The instrument was slowly     withdrawn as the mucosa was fully examined.     <<PROCEDUREIMAGES>>           A stricture was found at the gastroesophageal junction (see     image2). Early stricture Dilation with maloney dilator 18mm     Minimal resistance; minimal heme  Otherwise the examination was     normal.    Retroflexed views revealed no abnormalities.    The     scope was then withdrawn from the patient and the procedure     completed.           COMPLICATIONS:  None           ENDOSCOPIC IMPRESSION:     1) Stricture at the gastroesophageal junction - s/p maloney     dilitation     RECOMMENDATIONS:     1) continue PPI - zegerid     2) Call office next 2-3 days to schedule an office appointment     for 4-6 weeks     3) dilatations PRN           REPEAT  EXAM:  No           ______________________________     Barbette Hair. Arlyce Dice, MD           CC:           n.     eSIGNED:   Barbette Hair. Kaplan at 12/16/2009 09:07 AM           Polasek, Elmarie Shiley, [id number]  Note: An exclamation mark (!) indicates a result that was not dispersed into the flowsheet. Document Creation Date: 12/16/2009 9:07 AM _______________________________________________________________________  (1) Order result status: Final Collection or observation date-time: 12/16/2009 09:01 Requested date-time:  Receipt date-time:  Reported date-time:  Referring  Physician:   Ordering Physician: Melvia Heaps [phone]) Specimen Source:  Source: Launa Grill Order Number: [id number] Lab site:   Appended Document: Upper Endoscopy    Clinical Lists Changes

## 2010-02-26 NOTE — Letter (Signed)
Summary: Out of Work  [facility]  [address] [place], [place] [address]   Phone: [phone]  Fax: [phone]    December 16, 2009   Employee:  DENEE BOEDER    To Whom It May Concern:   For Medical reasons, please excuse the above named employee from work for the following dates:  Start:    End:    If you need additional information, please feel free to contact our office.         Sincerely,    Weston Brass RN

## 2010-02-26 NOTE — Assessment & Plan Note (Signed)
Summary: DYSPHAGIA/YF   History of Present Illness Visit Type: Initial Consult Primary GI MD: Melvia Heaps MD [facility] Primary Dericka Ostenson: Roxy Manns, MD Requesting Akiah Bauch: Roxy Manns, MD Chief Complaint: RUQ pain, nausea, loss of appetite x 8 weeks History of Present Illness:   [name] Is a 30 year old White Female Referred at the Request of Dr. Clayton Lefort ER for complaints of dyspepsia and dysphagia.  Over the past 2 months she has been complaining of postprandial, bloating, anorexia and nausea.  Symptoms are particularly severe after bedtime.  She is taking Prilosec  twice a day with partial relief of pyrosis.  She complains of dysphagia to solids and a sensation of globus.   She is on no gastric irritants including nonsteroidals.  She complains of tenderness and discomfort in the right upper quadrant and midepigastric areas.  She complains of sore throat and occasional hoarseness.She is status post cholecystectomy.  She has also noticed a change in bowel habits consisting of frequent loose stools.  Heretofore she had tended to have constipation.  In 2008 she underwent upper endoscopy and colonoscopy.  The former demonstrated a hiatal hernia.  Polyps were removed by colonoscopy.  GI history is also pertinent for what appears to be a liver hemangioma demonstrated on ultrasound.   GI Review of Systems    Reports abdominal pain, acid reflux, bloating, loss of appetite, and  nausea.     Location of  Abdominal pain: RUQ.    Denies belching, chest pain, dysphagia with liquids, dysphagia with solids, heartburn, vomiting, vomiting blood, weight loss, and  weight gain.      Reports constipation and  diarrhea.     Denies anal fissure, black tarry stools, change in bowel habit, diverticulosis, fecal incontinence, heme positive stool, hemorrhoids, irritable bowel syndrome, jaundice, light color stool, liver problems, rectal bleeding, and  rectal pain. Preventive Screening-Counseling & Management      Drug  Use:  no.      Current Medications (verified): 1)  Prilosec 20 Mg Cpdr (Omeprazole) .Marland Kitchen.. 1 By Mouth Two Times A Day  Allergies (verified): 1)  ! Penicillin 2)  ! * Clindamycin  Past History:  Past Medical History: Reviewed history from 02/06/2008 and no changes required. GERD, HH  endometriosis fatigue smoking hx kidney stone  recurrent OE  ENT-- [facility]  urologist - [place] [facility]  GYN - [facility]   Past Surgical History: Cholecystectomy Ovarian cysts Laparoscopy (11/2004)- endometriosis colonosc 4/08- polyp EGD 4/08 nl HH  abd [place] 10/11 -  ? hemangioma of liver  Breast reduction 2004  Family History: Reviewed history from 02/06/2008 and no changes required. family hx of cirrhosis of liver- successful transplant)- no etoh or hepatitis, HTN mother cervical ca, kidney ca, MS, endometriosis, HTN, DM  brother - died in MVA, kidney failure  sister - colon polyps - ? cancerous  GF with colon cancer , died of brain aneurysm GM mat- breast ca great aunts M with breast cancer   fam hx of ? wilson's dz -- father may have had it   Social History: quit smoking 4/07 Patient is a former smoker.  much stress caring for father with liver failure rare alcohol  Occupation: [employer] Illicit Drug Use - no Drug Use:  no  Review of Systems       The patient complains of anxiety-new, back pain, cough, fatigue, headaches-new, shortness of breath, sleeping problems, sore throat, thirst - excessive, and voice change.  The patient denies allergy/sinus, anemia, arthritis/joint pain, blood in urine, breast  changes/lumps, change in vision, confusion, coughing up blood, depression-new, fainting, fever, hearing problems, heart murmur, heart rhythm changes, itching, menstrual pain, muscle pains/cramps, night sweats, nosebleeds, pregnancy symptoms, skin rash, swelling of feet/legs, swollen lymph glands, thirst - excessive , urination - excessive , urination changes/pain, urine leakage, and  vision changes.         All other systems were reviewed and were negative   Vital Signs:  Patient profile:   29 year old female Height:      64 inches Weight:      156.13 pounds BMI:     26.90 Pulse rate:   72 / minute Pulse rhythm:   regular BP sitting:   102 / 66  (left arm) Cuff size:   regular  Vitals Entered By: June McMurray CMA Duncan Dull) (December 03, 2009 9:00 AM)  Physical Exam  Additional Exam:  N. physical exam she is a well-developed well-nourished female  skin: anicteric HEENT: normocephalic; PEERLA; no nasal or pharyngeal abnormalities neck: supple nodes: no cervical lymphadenopathy chest: clear to ausculatation and percussion heart: no murmurs, gallops, or rubs abd: soft, nontender; BS normoactive; no abdominal masses, organomegaly; there is mild tenderness in the right upper quadrant without guarding or rebound rectal: deferred ext: no cynanosis, clubbing, edema skeletal: no deformities neuro: oriented x 3; no focal abnormalities    Impression & Recommendations:  Problem # 1:  DYSPEPSIA (ICD-536.8) Symptoms are most likely related to GERD.  She probably has nocturnal symptoms.  Recommendations #1 trial of Zegerid 40 mg q.h.s.  Problem # 2:  DYSPHAGIA UNSPECIFIED (ICD-787.20) Rule out early esophageal stricture  Recommendations #1 upper endoscopy with dilatation as indicated  Problem # 3:  PERSONAL HISTORY OF COLONIC POLYPS (ICD-V12.72) I will review her prior records to determine whether she has adenomas polyps require followup  Problem # 4:  DIARRHEA (ICD-787.91) Etiology for this is not certain.  This may be related to her dyspepsia.  Recommendations #1 I will address this problem once her dyspepsia is resolved.  Other Orders: EGD (EGD)  Patient Instructions: 1)  Copy sent to : Roxy Manns, MD 2)  Your Endoscopy is scheduled for 12/16/2009 at 8:30am 3)  Conscious Sedation brochure given.  4)  Upper Endoscopy with Dilatation brochure  given.  5)  The medication list was reviewed and reconciled.  All changed / newly prescribed medications were explained.  A complete medication list was provided to the patient / caregiver. Prescriptions: ZEGERID 40-1100 MG CAPS (OMEPRAZOLE-SODIUM BICARBONATE) take one tablet at bedtime  #30 x 1   Entered by:   Merri Ray CMA (AAMA)   Authorized by:   Louis Meckel MD   Signed by:   Merri Ray CMA (AAMA) on 12/03/2009   Method used:   Electronically to        [facility]* (retail)       [address]       [place], [place]  [address]       Ph: [phone]       Fax: [phone]   RxID:   [id number] ZEGERID 40-1100 MG CAPS (OMEPRAZOLE-SODIUM BICARBONATE) take one tablet at bedtime  #30 x 1   Entered and Authorized by:   Louis Meckel MD   Signed by:   Louis Meckel MD on 12/03/2009   Method used:   Historical   RxID:   [id number]

## 2010-02-26 NOTE — Letter (Signed)
Summary: Out of Work  [facility]  [address] [place], [place] [address]   Phone: [phone]  Fax: [phone]    December 16, 2009   Employee:  QUANNA WITTKE    To Whom It May Concern:   For Medical reasons, please excuse the above named employee from work for the following dates:  Start:   12/16/09  End:   12/16/09  If you need additional information, please feel free to contact our office.         Sincerely,    Weston Brass RN

## 2010-02-26 NOTE — Progress Notes (Signed)
Summary: Pt saw GYN   Phone Note Call from Patient Call back at [phone]   Caller: Patient Call For: Judith Part MD Summary of Call: Pt called to cancel appt on 11/25/09. Pt said she was following up with you to get some test scheduled after an ER visit to [facility]. Pt saw her GYN on 11/21/09 and GYN office is referring pt to Gastroenterologist and ordering endoscopy and colonoscopy (have not got appts yet pt is hoping for appts this week or next.) Pt states she is feeling better and if she needs to see Dr Milinda Antis she will call back.. I had already gotten ER record from E Chart so I am putting that on your shelf in the in box. Initial call taken by: Lewanda Rife LPN,  November 24, 2009 3:43 PM

## 2010-02-26 NOTE — Progress Notes (Signed)
Summary: refill request for topamax  Phone Note Refill Request Message from:  Fax from Pharmacy  Refills Requested: Medication #1:  TOPAMAX 25 MG  TABS 1 by mouth two times a day   Last Refilled: 02/06/2008 Faxed request from [facility], [phone].  Initial call taken by: Lowella Petties CMA,  May 06, 2009 3:49 PM  Follow-up for Phone Call        px written on EMR for call in  Follow-up by: Judith Part MD,  May 06, 2009 5:04 PM  Additional Follow-up for Phone Call Additional follow up Details #1::        Medication phoned to  [facility] pharmacy as instructed. Lewanda Rife LPN  May 06, 2009 5:14 PM     Prescriptions: TOPAMAX 25 MG  TABS (TOPIRAMATE) 1 by mouth two times a day  #60 x 11   Entered and Authorized by:   Judith Part MD   Signed by:   Lewanda Rife LPN on 45/40/9811   Method used:   Telephoned to ...       [facility]* (retail)       [address]       [place], [place]  [address]       Ph: [phone]       Fax: [phone]   RxID:   [id number]

## 2010-02-26 NOTE — Letter (Signed)
Summary: Return to Work  [facility]  [address] [place], [place] [address]   Phone: [phone]  Fax: [phone]    12/03/2009  TO: Leodis Sias IT MAY CONCERN  RE: [name] [address] [id number]   The above named individual is under my medical care and may return to work on: 12/03/2009    If you have any further questions or need additional information, please call.     Sincerely,     Enyla Lisbon,MD    typed by: Merri Ray CMA (AAMA)

## 2010-02-26 NOTE — Progress Notes (Signed)
Summary: fever blisters  Phone Note Call from Patient Call back at Home Phone [phone]   Caller: Patient Call For: Judith Part MD Summary of Call: Patient says that she has fever blishter on her mouth, she is aksing for rx for valtrex. She says that she has used this in the past and it has really worked well for her.  She uses  [address]  Initial call taken by: Melody Comas,  February 11, 2010 4:14 PM  Follow-up for Phone Call        px written on EMR for call in f/u if not improved  Follow-up by: Judith Part MD,  February 11, 2010 5:13 PM  Additional Follow-up for Phone Call Additional follow up Details #1::        Rx sent in as directed. Patient notified and instructed to follow up if Rx doesn't help.  Additional Follow-up by: Janee Morn CMA Duncan Dull),  February 11, 2010 5:18 PM    New/Updated Medications: VALTREX 1 GM TABS (VALACYCLOVIR HCL) 2 by mouth two times a day for one day (separate doses by 12 hours)  as needed cold sore Prescriptions: VALTREX 1 GM TABS (VALACYCLOVIR HCL) 2 by mouth two times a day for one day (separate doses by 12 hours)  as needed cold sore  #4 x 1   Entered by:   Janee Morn CMA (AAMA)   Authorized by:   Judith Part MD   Signed by:   Selena Batten Dance CMA (AAMA) on 02/11/2010   Method used:   Electronically to        [facility] [address] [phone]* (retail)       [address]       [place], [place]  [id number]       Ph: [phone] or [phone]       Fax: [phone]   RxID:   [id number]

## 2010-02-26 NOTE — Progress Notes (Signed)
Summary: need letter for jury duty   Phone Note Call from Patient Call back at Home Phone [phone]   Caller: Patient Call For: Judith Part MD Summary of Call: Patient went to urgent care over weekend, then was admitted to [facility] long. She was discharged on monday and then went for a pelvic ultrasound yesterday. She was diagnosed with having several masses on her liver. She was told to f/u immediately with her PCP. Your next available is tuesday the 1st. Patient reallywants that appt. because she doesn't want to wait any longer, but has jury duty that day.She is asking if you would be willing to write her a letter stating that she has a medical concern to address to excuse her from the jury duty. Please advise.  Initial call taken by: Melody Comas,  November 19, 2009 3:49 PM  Follow-up for Phone Call        I'm fine to write her out of jury duty -- but if she needs to see someone in my absence please set her up with first availible  please send for her records  will put note in IN box Follow-up by: Judith Part MD,  November 19, 2009 5:01 PM  Additional Follow-up for Phone Call Additional follow up Details #1::        Patient notified as instructed by telephone. Pt said she wants to keep appt with Dr Milinda Antis on Tues at 4pm until she sees the GYN tomorrow at 9:45am. Pt will have father pick up letter for jury duty. Letter left at front desk. records available in e chart are on your shelf in the in box. Lewanda Rife LPN  November 20, 2009 2:29 PM

## 2010-02-26 NOTE — Letter (Signed)
Summary: Out of Work  [facility]  [address]   [place], [place] [address]   Phone: [phone]  Fax: [phone]    December 16, 2009   Employee:  Lakeisha N Robinson    To Whom It May Concern:   For Medical reasons, please excuse the above named employee from work for the following dates:  Start:    End:    If you need additional information, please feel free to contact our office.         Sincerely,    Karen Tyrrell RN 

## 2010-02-26 NOTE — Letter (Signed)
Summary: [facility]  [facility]   Imported By: Lanelle Bal 12/05/2009 11:11:59  _____________________________________________________________________  External Attachment:    Type:   Image     Comment:   External Document

## 2010-02-26 NOTE — Letter (Signed)
Summary: Out of Work  [facility]  [address] [place], [place] [address]   Phone: [phone]  Fax: [phone]    November 26, 2009   Employee:  [name] [employer]    To Whom It May Concern:   For Medical reasons, please excuse the above named employee from work for the following dates:  Start:   11/26/09  End:   11/26/09  If you need additional information, please feel free to contact our office.         Sincerely,    Lewanda Rife LPN

## 2010-02-26 NOTE — Letter (Signed)
Summary: Generic Letter   at [facility]  [address] [place], [place] [address]   Phone: [phone]  Fax: [phone]    11/19/2009  [facility] [address] [place], [place]  [address]  To whom it may concerned,   My patient [name] was recently released from the hospital for an acute medical problem and is unable to do her upcomig jury duty.  Could you please re- schedule her service for another time while we work on her illness? Thank you.   Sincerely,   Roxy Manns MD

## 2010-02-26 NOTE — Assessment & Plan Note (Signed)
Summary: NEEDS REFERRALS   Vital Signs:  Patient profile:   30 year old female Height:      63 inches Weight:      156.50 pounds BMI:     27.82 Temp:     98.3 degrees F oral Pulse rate:   74 / minute Pulse rhythm:   regular BP sitting:   118 / 80  (left arm) Cuff size:   regular  Vitals Entered By: Selena Batten Dance CMA Duncan Dull) (November 26, 2009 12:47 PM) CC: Needs GI referral   History of Present Illness: this is the 4th week of nausea and pains in upper stomach intermittent diarrhea went to urgent care - did a bunch of blood tests  was told her abd felt firm and was tender labs showed anemia with low iron and hb and liver enzymes were low (? signif)   was told to go to ER but she did not  went to [facility] next - nl CT scan  had a high bp that day -- was low 80/46-- was there for 12 hours and given fluids (nauseated and dizzy)  called mon am -- was set up for pelvic and abd US pelvic US showed tubes inflammed and endometriosis - tuesday at specialist in [place] then abd [place] [facility] imag/ [facility]-- showed "masses on liver"  2 hyperechoic liver masses were seen - ? hemangioma/ ? 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 Penicillin 2)  ! * Clindamycin  Past History:  Past Medical History: Last updated: 02/06/2008 GERD, HH  endometriosis fatigue smoking hx kidney stone  recurrent OE  ENT-- GI [facility]  urologist - [place]  GYN - [facility]   Family History: Last updated: 02/06/2008 family hx of cirrhosis of liver- successful transplant)- no etoh or hepatitis, HTN mother cervical ca, kidney ca, MS, endometriosis, HTN, DM  brother - died in MVA, kidney failure  sister - colon polyps - ? cancerous  GF with colon cancer , died of brain aneurysm GM mat- breast ca great aunts M with breast cancer     fam hx of ? wilson's dz -- father may have had it   Social History: Last updated: 02/06/2008 quit smoking 4/07 Patient is a former smoker.  much stress caring for father with liver failure rare alcohol   Risk Factors: Smoking Status: quit (04/27/2006)  Past Surgical History: Cholecystectomy Ovarian cysts Laparoscopy (11/2004)- endometriosis colonosc 4/08- polyp EGD 4/08 nl HH  abd [place] 10/11 -  ? hemangioma of liver   Review of Systems General:  Denies fatigue, fever, loss of appetite, and malaise. Eyes:  Denies blurring and eye irritation. CV:  Denies chest pain or discomfort, lightheadness, and palpitations. Resp:  Denies cough, shortness of breath, and wheezing. GI:  Complains of abdominal pain, change in bowel habits, diarrhea, gas, loss of appetite, and nausea; denies bloody stools, dark tarry stools, and vomiting; no chance pregnant - neg test and had BTL. GU:  Denies dysuria, hematuria, and urinary frequency. MS:  Denies joint pain. Derm:  Denies itching, lesion(s), poor wound healing, and rash. Neuro:  Denies numbness and tingling. Psych:  mood is ok . Endo:  Denies cold intolerance, excessive thirst, excessive urination, and heat intolerance. Heme:  Denies abnormal bruising and bleeding.  Physical Exam  General:  Well-developed,well-nourished,in no acute distress; alert,appropriate and cooperative throughout examination Head:  normocephalic, atraumatic, and no abnormalities observed.   Eyes:  vision grossly intact, pupils equal, pupils round, and pupils reactive to light.  no conjunctival pallor, injection or icterus  Nose:  no nasal discharge.   Mouth:  pharynx pink and moist.   Neck:  No deformities, masses, or tenderness noted. Lungs:  Normal respiratory effort, chest expands symmetrically. Lungs are clear to auscultation, no crackles or wheezes. Heart:  Normal rate and regular rhythm. S1 and S2 normal without gallop, murmur, click, rub or other extra  sounds. Abdomen:  epigastric tenderness without rebound or gaurding  soft, normal bowel sounds, no hepatomegaly, and no splenomegaly.   Msk:  no CVA tenderness  no LS tenderness Extremities:  No clubbing, cyanosis, edema, or deformity noted with normal full range of motion of all joints.   Skin:  Intact without suspicious lesions or rashes no pallor or jaundice Cervical Nodes:  No lymphadenopathy noted Inguinal Nodes:  No significant adenopathy Psych:  nl affect    Impression & Recommendations:  Problem # 1:  DYSPEPSIA (ICD-536.8) Assessment Deteriorated this is worsened with inc burning epigastric and anemia and nausea/ loss of appetite  (waiting on labs and rest of records from [facility] and also [facility]) inst to inc ppi to two times a day  ref to GI also will disc  colonosc in light of fam hx  Orders: Gastroenterology Referral (GI)  Problem # 2:  ENDOMETRIOSIS (ICD-617.9) Assessment: Deteriorated will continue gyn f/u for this -- ? of worsening since BTL  Complete Medication List: 1)  Prilosec 20 Mg Cpdr (Omeprazole) .Marland Kitchen.. 1 by mouth two times a day  Patient Instructions: 1)  increase your prilosec to 1 pill two times a day  2)  we will do GI referral at check out  3)  please send for [facility] recent ER note and also urgent care note and labs from [place]    Orders Added: 1)  Gastroenterology Referral [GI] 2)  Est. Patient Level IV [[address]]    Prior Medications: Current Allergies (reviewed today): ! PENICILLIN ! * CLINDAMYCIN

## 2010-03-27 ENCOUNTER — Encounter: Payer: Self-pay | Admitting: Family Medicine

## 2010-03-27 ENCOUNTER — Ambulatory Visit (INDEPENDENT_AMBULATORY_CARE_PROVIDER_SITE_OTHER): Payer: BC Managed Care – PPO | Admitting: Family Medicine

## 2010-03-27 DIAGNOSIS — N6019 Diffuse cystic mastopathy of unspecified breast: Secondary | ICD-10-CM

## 2010-03-27 DIAGNOSIS — N644 Mastodynia: Secondary | ICD-10-CM

## 2010-03-27 DIAGNOSIS — K120 Recurrent oral aphthae: Secondary | ICD-10-CM

## 2010-03-30 ENCOUNTER — Other Ambulatory Visit: Payer: Self-pay | Admitting: Family Medicine

## 2010-03-30 DIAGNOSIS — N644 Mastodynia: Secondary | ICD-10-CM

## 2010-04-02 NOTE — Assessment & Plan Note (Signed)
Summary: CHECK BUMPS UNDER ARM/CLE  [employer]   Vital Signs:  Patient profile:   30 year old female Weight:      156.25 pounds Temp:     98.8 degrees F oral Pulse rate:   88 / minute Pulse rhythm:   regular BP sitting:   130 / 70  (left arm) Cuff size:   regular  Vitals Entered By: Selena Batten Dance CMA Duncan Dull) (March 27, 2010 4:25 PM) CC: Check bumps under arms/breasts tender   History of Present Illness: CC: check bumps under arms.  1. ulcers in mouth - going on for last 1 1/2 months.  s/p 2 courses valtrex (currently on second course.)  hasn't had this before.  2. also noticed lumps underarms as well as tender breasts for 3 weeks now.  Did notice lumps bilaterally  ~1 1/2 mo ago but only started becoming tender 3 wks ago.  Also very hot breasts to touch.  Hurts to wear bra.  Noticed some redness.  h/o breast reduction 2004 but no problems since.  No recent pregnancies.  h/o BTL 2008.  currently on day 3 of period  restarted smoking recently.  + GM with breast cancer dx at age 37.  Current Medications (verified): 1)  Zegerid 40-1100 Mg Caps (Omeprazole-Sodium Bicarbonate) .... Take One Tablet At Bedtime 2)  Valtrex 1 Gm Tabs (Valacyclovir Hcl) .... 2 By Mouth Two Times A Day For One Day (Separate Doses By 12 Hours)  As Needed Cold Sore  Allergies: 1)  ! Penicillin 2)  ! * Clindamycin  Past History:  Past Medical History: Last updated: 02/06/2008 GERD, HH  endometriosis fatigue smoking hx kidney stone  recurrent OE  ENT-- [facility]  urologist - [place]  GYN - [place]   Family History: family hx of cirrhosis of liver- (successful transplant)- no etoh or hepatitis, HTN, wilson's disease mother - cervical ca, kidney ca, MS, endometriosis, HTN, DM  brother - died in MVA, kidney failure  sister - colon polyps - ? 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  An impending legal battle has ensued.    Review of Systems       per HPI  Physical Exam  General:  Well-developed,well-nourished,in no acute distress; alert,appropriate and cooperative throughout examination Head:  normocephalic, atraumatic, and no abnormalities observed.   Mouth:  pharynx pink and moist.  fair dentition, several root canals,  left upper molar with possible cavity/root canal, tender.  + several apthous ulcers lower gumline as well as one posterior to left upper molar Neck:  No deformities, masses, or tenderness noted. Breasts:  No deformity of breasts or asymmetry.  several tender lumps throughout bilateral breasts, tender axillae as well.  no erythema or induration throughout breasts Skin:  Intact without suspicious lesions or rashes no pallor or jaundice   Impression & Recommendations:  Problem # 1:  MASTALGIA (ICD-611.71)  likely due to fibrocystic breast disease.  reassured, treat with NSAID, well fitting bra.  recommended smoking cessation.  Given new finding and no prior h/o  imaging, will schedule for [place] to eval mastalgia.  Orders: Radiology Referral (Radiology)  Problem # 2:  APHTHOUS ULCERS (ICD-528.2) treat with orabase and triamcinolone to speed resolution.    Problem # 3:  FIBROCYSTIC BREAST DISEASE (ICD-610.1)  Complete Medication List: 1)  Zegerid 40-1100 Mg Caps (Omeprazole-sodium bicarbonate) .... Take one tablet at bedtime 2)  Valtrex 1 Gm Tabs (Valacyclovir hcl) .... 2 by mouth two times a day for one day (separate doses by 12 hours)  as needed cold sore 3)  Naprosyn 500 Mg Tabs (Naproxen) .... Take one twice daily for 7 days then as neeed 4)   Oralone 0.1 % Pste (Triamcinolone acetonide) .... Apply to mouth lesion small dab two times a day, small op  Patient Instructions: 1)  We will set you up with ultrasound to evaluate lumps in breast. 2)  In meantime, start taking naprosyn twice daily for 7 days with food to help with inflammation. 3)  For mouth, continue orabase.  sent in steroid cream to use in orabase.  check with dentist.  if remaining, let us know. 4)  Good to see you today, call clinic with questions.  Prescriptions: ORALONE 0.1 % PSTE (TRIAMCINOLONE ACETONIDE) apply to mouth lesion small dab two times a day, small op  #1 x 0   Entered and Authorized by:   Eustaquio Boyden  MD   Signed by:   Eustaquio Boyden  MD on 03/27/2010   Method used:   Electronically to        [facility] [address] [phone]* (retail)       [address]       [place], [place]  [id number]       Ph: [phone] or [phone]       Fax: [phone]   RxID:   [id number] NAPROSYN 500 MG TABS (NAPROXEN) take one twice daily for 7 days then as neeed  #30 x 0   Entered and Authorized by:   Eustaquio Boyden  MD   Signed by:   Eustaquio Boyden  MD on 03/27/2010   Method used:   [facility]        [facility] [address] [phone]* (retail)       [address]       [place], [place]  [id number]       Ph: [phone] or [phone]       Fax: [phone]   RxID:   [id number]    Orders Added: 1)  Est. Patient Level IV [78938] 2)  Radiology Referral [Radiology]    Current Allergies (reviewed today): ! PENICILLIN ! * CLINDAMYCIN

## 2010-04-03 ENCOUNTER — Ambulatory Visit
Admission: RE | Admit: 2010-04-03 | Discharge: 2010-04-03 | Disposition: A | Discharge status: Home / Self Care | Payer: BC Managed Care – PPO | Source: Ambulatory Visit | Attending: Family Medicine | Admitting: Family Medicine

## 2010-04-03 ENCOUNTER — Other Ambulatory Visit: Payer: Self-pay | Admitting: Family Medicine

## 2010-04-03 DIAGNOSIS — N644 Mastodynia: Secondary | ICD-10-CM

## 2010-04-08 LAB — POCT I-STAT, CHEM 8
BUN: 9 mg/dL (ref 6–23)
Calcium, Ion: 1.17 mmol/L (ref 1.12–1.32)
Chloride: 108 mEq/L (ref 96–112)
Creatinine, Ser: 0.6 mg/dL (ref 0.4–1.2)
Glucose, Bld: 82 mg/dL (ref 70–99)
HCT: 40 % (ref 36.0–46.0)
Hemoglobin: 13.6 g/dL (ref 12.0–15.0)
Potassium: 3.4 mEq/L — ABNORMAL LOW (ref 3.5–5.1)
Sodium: 143 meq/L (ref 135–145)
TCO2: 24 mmol/L (ref 0–100)

## 2010-04-08 LAB — CBC
HCT: 38.4 % (ref 36.0–46.0)
Hemoglobin: 13.3 g/dL (ref 12.0–15.0)
MCH: 30.4 pg (ref 26.0–34.0)
MCHC: 34.6 g/dL (ref 30.0–36.0)
MCV: 87.9 fL (ref 78.0–100.0)
Platelets: 229 10*3/uL (ref 150–400)
RBC: 4.37 MIL/uL (ref 3.87–5.11)
RDW: 13 % (ref 11.5–15.5)
WBC: 9.6 10*3/uL (ref 4.0–10.5)

## 2010-04-08 LAB — URINE MICROSCOPIC-ADD ON

## 2010-04-08 LAB — DIFFERENTIAL
Basophils Absolute: 0 10*3/uL (ref 0.0–0.1)
Basophils Relative: 1 % (ref 0–1)
Eosinophils Absolute: 0.1 10*3/uL (ref 0.0–0.7)
Eosinophils Relative: 1 % (ref 0–5)
Lymphocytes Relative: 19 % (ref 12–46)
Lymphs Abs: 1.8 K/uL (ref 0.7–4.0)
Monocytes Absolute: 0.7 K/uL (ref 0.1–1.0)
Monocytes Relative: 7 % (ref 3–12)
Neutro Abs: 6.9 K/uL (ref 1.7–7.7)
Neutrophils Relative %: 72 % (ref 43–77)

## 2010-04-08 LAB — URINALYSIS, ROUTINE W REFLEX MICROSCOPIC
Bilirubin Urine: NEGATIVE
Glucose, UA: NEGATIVE mg/dL
Ketones, ur: NEGATIVE mg/dL
Nitrite: NEGATIVE
Protein, ur: NEGATIVE mg/dL
Specific Gravity, Urine: 1.011 (ref 1.005–1.030)
Urobilinogen, UA: 0.2 mg/dL (ref 0.0–1.0)
pH: 7.5 (ref 5.0–8.0)

## 2010-04-12 LAB — URINALYSIS, ROUTINE W REFLEX MICROSCOPIC
Bilirubin Urine: NEGATIVE
Glucose, UA: NEGATIVE mg/dL
Ketones, ur: NEGATIVE mg/dL
Specific Gravity, Urine: 1.01 (ref 1.005–1.030)
pH: 8 (ref 5.0–8.0)

## 2010-04-12 LAB — WET PREP, GENITAL
Clue Cells Wet Prep HPF POC: NONE SEEN
Trich, Wet Prep: NONE SEEN

## 2010-04-12 LAB — GC/CHLAMYDIA PROBE AMP, GENITAL: Chlamydia, DNA Probe: NEGATIVE

## 2010-04-15 ENCOUNTER — Other Ambulatory Visit: Payer: Self-pay | Admitting: Family Medicine

## 2010-04-15 ENCOUNTER — Ambulatory Visit: Payer: BC Managed Care – PPO | Admitting: Obstetrics & Gynecology

## 2010-04-15 DIAGNOSIS — Z01419 Encounter for gynecological examination (general) (routine) without abnormal findings: Secondary | ICD-10-CM

## 2010-04-15 DIAGNOSIS — Z1272 Encounter for screening for malignant neoplasm of vagina: Secondary | ICD-10-CM

## 2010-05-06 LAB — URINE MICROSCOPIC-ADD ON

## 2010-05-06 LAB — URINALYSIS, ROUTINE W REFLEX MICROSCOPIC
Bilirubin Urine: NEGATIVE
Glucose, UA: NEGATIVE mg/dL
Ketones, ur: NEGATIVE mg/dL
Nitrite: NEGATIVE
Protein, ur: NEGATIVE mg/dL
pH: 7 (ref 5.0–8.0)

## 2010-05-09 ENCOUNTER — Encounter: Payer: Self-pay | Admitting: Family Medicine

## 2010-05-18 ENCOUNTER — Encounter: Payer: Self-pay | Admitting: Family Medicine

## 2010-05-18 ENCOUNTER — Ambulatory Visit (INDEPENDENT_AMBULATORY_CARE_PROVIDER_SITE_OTHER): Payer: BC Managed Care – PPO | Admitting: Family Medicine

## 2010-05-18 ENCOUNTER — Other Ambulatory Visit: Payer: Self-pay | Admitting: Family Medicine

## 2010-05-18 DIAGNOSIS — K219 Gastro-esophageal reflux disease without esophagitis: Secondary | ICD-10-CM

## 2010-05-18 DIAGNOSIS — R5381 Other malaise: Secondary | ICD-10-CM

## 2010-05-18 DIAGNOSIS — E876 Hypokalemia: Secondary | ICD-10-CM

## 2010-05-18 DIAGNOSIS — R5383 Other fatigue: Secondary | ICD-10-CM | POA: Insufficient documentation

## 2010-05-18 DIAGNOSIS — Z87442 Personal history of urinary calculi: Secondary | ICD-10-CM

## 2010-05-18 DIAGNOSIS — N898 Other specified noninflammatory disorders of vagina: Secondary | ICD-10-CM

## 2010-05-18 DIAGNOSIS — N912 Amenorrhea, unspecified: Secondary | ICD-10-CM

## 2010-05-18 DIAGNOSIS — E538 Deficiency of other specified B group vitamins: Secondary | ICD-10-CM

## 2010-05-18 LAB — VITAMIN B12: Vitamin B-12: 226 pg/mL (ref 211–911)

## 2010-05-18 LAB — POTASSIUM: Potassium: 4.3 mEq/L (ref 3.5–5.1)

## 2010-05-18 NOTE — Assessment & Plan Note (Signed)
Low in ER at time of kidney stone and was given suppl there Level today No cramping

## 2010-05-18 NOTE — Patient Instructions (Signed)
Labs today for potassium and serum pregnancy and B12 level  We will work on urology and GI referrals at check out  Let me know if symptoms worsen

## 2010-05-18 NOTE — Assessment & Plan Note (Signed)
Recent one passed- rev notes Ref to urol for recurrent stones and also to disc fam hx

## 2010-05-18 NOTE — Progress Notes (Signed)
Subjective:    Patient ID: [name], female    DOB: April 30, 1980, 30 y.o.   MRN: [id number]  HPI Here for f/u of ER visit at [facility]  Reviewed her records Had kidney stone and also vag d/c Having heartburn also every night  Really tired    Given norco - for kidney stone  She did pass it there and none since  Hx of stones=this is 3rd one in 5 years  Mother had kidney cancer, brother had kidney transplant for ? Some disease (bmother had too)  Would like to see urologist   Pelvic US ok in December  Has endometriosis  Saw Dr Shawnie Pons - problems with tubal -- needs surgery corrected for a "nerve" problem  " post tubal ligation syndrome " - looking into that  Stomach is bloated  Chronic pelvic pain is not changed  Heartburn every night -- prevacid bid  Not really better on zegerid  Eats appropriate foods Has had EGD in past -- sees [place] - also due for colonoscopy  No more trouble swallowing, however   Lots of stress!!!- may play a role also in her overwhelming fatigue     Lab ok except for K of 31  Nl wet prep and std tests  The vaginal discharge was normal  Some spotting since feb - but no full period  Neg preg test at home  In past she had false neg Has had tubal   Past Medical History  Diagnosis Date  . GERD (gastroesophageal reflux disease)   . Endometriosis   . Fatigue a  . Kidney stone    Past Surgical History  Procedure Date  . Cholecystectomy   . Ovarian cyst surgery   . Laparoscopy 11/2004    endometriosis  . Breast surgery 2004    breast reduction    reports that she quit smoking about 5 years ago. She does not have any smokeless tobacco history on file. She reports that she drinks alcohol. She reports that she does not use illicit drugs. family history includes Cancer in her maternal aunt, mother, and sister; Diabetes in her mother; Hypertension in her mother; and Kidney disease in her brother. Allergies  Allergen Reactions  . Clindamycin    REACTION: hives, angioedema  . Penicillins     REACTION: hives, angioedema      Review of Systems  Constitutional: Positive for fatigue. Negative for fever, chills and unexpected weight change.  HENT: Negative for sore throat, trouble swallowing and neck stiffness.   Eyes: Negative for pain and visual disturbance.  Respiratory: Negative for cough, chest tightness, shortness of breath and wheezing.   Cardiovascular: Negative for chest pain, palpitations and leg swelling.  Gastrointestinal: Positive for abdominal pain and abdominal distention. Negative for nausea, vomiting, diarrhea, constipation and blood in stool.  Genitourinary: Positive for vaginal discharge. Negative for urgency, frequency, hematuria, flank pain, vaginal bleeding and vaginal pain.  Musculoskeletal: Positive for back pain. Negative for myalgias.  Skin: Negative.   Neurological: Positive for headaches. Negative for tremors, weakness and numbness.  Hematological: Negative for adenopathy. Does not bruise/bleed easily.  Psychiatric/Behavioral: Positive for dysphoric mood. Negative for suicidal ideas and sleep disturbance. The patient is nervous/anxious.        Objective:   Physical Exam  Constitutional: She appears well-developed and well-nourished.  HENT:  Head: Normocephalic and atraumatic.  Mouth/Throat: Oropharynx is clear and moist.  Eyes: Conjunctivae and EOM are normal. Pupils are equal, round, and reactive to light. No scleral icterus.  Neck: Normal range of motion. Neck supple. No JVD present. No thyromegaly present.  Cardiovascular: Normal rate, regular rhythm and normal heart sounds.   Pulmonary/Chest: Effort normal and breath sounds normal. No respiratory distress. She exhibits no tenderness.  Abdominal: Soft. Bowel sounds are normal. She exhibits no mass. There is no rebound.  Musculoskeletal: She exhibits no edema and no tenderness.  Lymphadenopathy:    She has no cervical adenopathy.  Neurological:  She is alert. She has normal reflexes. Coordination normal.  Skin: Skin is warm and dry. No rash noted. No erythema. No pallor.  Psychiatric:       Seems generally down and frustrated with her medical problems  No SI Good eye contact  Talks freely about her problems and symptoms           Assessment & Plan:

## 2010-05-18 NOTE — Assessment & Plan Note (Signed)
Multifactorial with stress and med problems  Rev ER labs K and B12 today

## 2010-05-18 NOTE — Assessment & Plan Note (Signed)
This is worse despite trial of zegerid and also prevacid  Ref GI egd in past  ? Due for colonosc for polyps as well ?

## 2010-05-18 NOTE — Assessment & Plan Note (Signed)
Resolved Neg wet prep and std tests

## 2010-05-18 NOTE — Assessment & Plan Note (Signed)
Serum preg test  Urine one neg at home Hx of endometriosis and also BTL in past - so unlikely

## 2010-05-18 NOTE — Assessment & Plan Note (Signed)
Check level with fatigue and worsening gerd

## 2010-05-20 ENCOUNTER — Encounter: Payer: Self-pay | Admitting: Family Medicine

## 2010-05-22 ENCOUNTER — Ambulatory Visit (INDEPENDENT_AMBULATORY_CARE_PROVIDER_SITE_OTHER): Payer: BC Managed Care – PPO | Admitting: Family Medicine

## 2010-05-22 ENCOUNTER — Encounter: Payer: Self-pay | Admitting: *Deleted

## 2010-05-22 ENCOUNTER — Encounter: Payer: Self-pay | Admitting: Family Medicine

## 2010-05-22 DIAGNOSIS — E538 Deficiency of other specified B group vitamins: Secondary | ICD-10-CM

## 2010-05-22 MED ORDER — CYANOCOBALAMIN 1000 MCG/ML IJ SOLN
1000.0000 ug | INTRAMUSCULAR | Status: DC
  Administered 2010-05-22 – 2011-01-11 (×2): 1000 ug via INTRAMUSCULAR

## 2010-05-22 NOTE — Progress Notes (Signed)
  Subjective:    Patient ID: [name], female    DOB: December 01, 1980, 30 y.o.   MRN: [id number]  HPI  Here for injection only  Review of Systems     Objective:   Physical Exam        Assessment & Plan:

## 2010-06-09 NOTE — H&P (Signed)
NAMENADELYN, [name]              ACCOUNT NO.:  [id number]   MEDICAL RECORD NO.:  [id number]          PATIENT TYPE:  INP   LOCATION:                                [facility]ILITY[facility]   PHYSICIAN:  Ginger Carne, MD  DATE OF BIRTH:  [birth year]   DATE OF ADMISSION:  05/16/2007  DATE OF DISCHARGE:                              HISTORY & PHYSICAL   PREOPERATIVE HISTORY AND PHYSICAL:  Surgery scheduled for May 16, 2007.   REASON FOR HOSPITALIZATION:  Chronic pelvic pain, dysmenorrhea and  endometriosis.   HISTORY OF PRESENT ILLNESS:  This patient is a 29 year old Caucasian  female, gravida 2, para 2-0-0-2, who has had a multi-year history at  least for the past 5 years of worsening bilateral pelvic pain.  She has  also had complaints of dyspareunia and dysmenorrhea.  In 2006, the  patient underwent an operative laparoscopy with laparoscopic lysis of  pelvic adhesions,  which demonstrated evidence of stage I endometriosis.  In October of 2008 she had a laparoscopic bilateral tubal ligation which  reconfirmed stage 1 endometriosis. After 2 years, a Mirena intrauterine  device was removed in 2006 due to failure in controlling dysmenorrhea.  The patient states that over a period of the past 2 to 3 times she has  been on continual oral contraceptives the discomfort has not improved.  This has had a negative impact on the quality of her life.  The patient  has a history of kidney stones, however.  A CAT scan without contrast  performed on August 16, 2006,  demonstrated no evidence of ureteral  stones.  There was a 5-mm nonobstructing calyceal calculus in the mid  portion of the left kidney.  No right renal calculi were identified.  No  evidence of hydronephrosis or ureteral calculi was noted.  The patient  in addition has no gastrointestinal symptomatology suggestive of  irritable bowel syndrome or musculoskeletal sources for her pain.  Of  note is that she does have urgency, voiding about 1  to 2 times an hour,  and nocturia with more than 2 episodes at night sometimes.  She has been  evaluated by a urologist in the past and told she did not have specific  findings for interstitial cystitis.  The patient had been offered a  course of 6 months to a year of Depo-Lupron, which she declined.   OB/GYN HISTORY:  The patient had 2 normal vaginal deliveries in 1997 and  1999.  Bilateral laparoscopic tubal ligation on October of 2008.   ALLERGIES:  PENICILLIN AND CLINDAMYCIN, BOTH OF WHICH CAUSED HIVES AND  THROAT SWELLING.   CURRENT MEDICATIONS:  Topamax 25 mg 1 twice a day and vitamin B  injections 2 times a month.   SURGICAL HISTORY:  Laparoscopic cholecystectomy in 2001, bilateral  breast reduction in 2004 and oral surgery 2003.  The patient had in 2006  an operative laparoscopy, including removal of an IUD, and in October  2008 had a bilateral laparoscopic tubal banding and reconfirmation  diagnosis of stage I endometriosis.   MEDICAL HISTORY:  Migraines and endometriosis  FAMILY HISTORY:  The patient's mother has hypertension and ovarian  carcinoma.   SOCIAL HISTORY:  Patient smokes less than 1/2 pack of cigarettes daily.  Denies alcohol or illicit drug abuse.   REVIEW OF SYSTEMS:  A 14-point comprehensive review of systems within  normal limits.   PHYSICAL EXAMINATION:  VITAL SIGNS:  Blood pressure is 122/79, weight  140 pounds, height 5 feet 3 inches, pulse 89 and regular.  HEENT:  Grossly normal.  BREAST:  Exam masses, discharge, thickenings or tenderness.  CHEST:  Clear to percussion and auscultation.  CARDIOVASCULAR:  Exam without  murmurs or enlargements.  Regular rate and rhythm.  No carotid bruits.  ABDOMEN:  Soft without gross hepatosplenomegaly.  PELVIC:  Normal Pap smear in February 2009.  External genitalia, vulva  and vagina normal.  Cervix smooth without erosions or lesions.  Uterus  is small, anteverted and flexed.  Both adnexa are without  palpable  masses but tender.  Rectovaginal exam is unremarkable.   Extremities, lymphatic, skin, neurological, and musculoskeletal systems  within normal limits.   IMPRESSION:  Chronic pelvic pain.  History of endometriosis,  dysmenorrhea, dyspareunia, and questionable history of interstitial  cystitis.   PLAN:  The patient has no desire for further childbearing, has had a  bilateral tubal ligation and has undergone adequate medical therapy.  She declines the use of Depo-Lupron, and she understands this does not  have long-term benefit.  Continual oral contraceptives and a Mirena IUD  have been been unsuccessful in controlling her pelvic pain, dysmenorrhea  or dysparunia. The patient therefore has agreed to a laparoscopic-  assisted vaginal hysterectomy, right salpingo-oophorectomy, appendectomy  and preservation of left tube and ovary.  She understands there is a 40%  chance of recurrent pelvic pain on the left side or dyspareunia as a  result of leaving the ovary or pelvic adhesions.  Risks, including  possible injuries to ureter, bowel and bladder, possible conversion to  an open procedure, hemorrhage requiring blood transfusion, postoperative  cuff cellulitis, wound infection or pulmonary complications, were  understood by said patient.  In addition, a cystoscopy with  hydrodistention for evaluation of interstitial cystitis will be  performed.  In the event the patient has said diagnosis, she will be  treated appropriately.  The patient understands the nature of both of  the procedures, has verbalized understanding of same, and all questions  answered to the satisfaction of said patient.      Ginger Carne, MD  Electronically Signed     SHB/MEDQ  D:  05/09/2007  T:  05/09/2007  Job:  [id number]

## 2010-06-09 NOTE — H&P (Signed)
[name], [name]              ACCOUNT NO.:  [id number]   MEDICAL RECORD NO.:  [id number]          PATIENT TYPE:  AMB   LOCATION:                                [facility]ILITY:  [facility]   PHYSICIAN:  Ginger Carne, MD  DATE OF BIRTH:  [birth year]   DATE OF ADMISSION:  11/04/2006  DATE OF DISCHARGE:                              HISTORY & PHYSICAL   REASON FOR ADMISSION:  Request for sterilization.   HISTORY OF PRESENT ILLNESS:  A 30 year old, G2, para 2-0-0-2 female who  desires permanent sterilization.  The patient has no desire for further  childbearing.  Options including a intrauterine device, oral  contraceptives and condoms were discussed and declined by said patient.   OB/GYN:  The patient has had two vaginal deliveries in 1997 and 1999.   PAST SURGICAL HISTORY:  1. She had laparoscopic cholecystectomy in 2001.  2. Bilateral breast reduction 2004.  3. Oral surgery in 2003.  4. Diagnostic laparoscopy in 2006 demonstrating pelvic endometriosis      and right adnexal adhesions.   MEDICAL HISTORY:  None.   CURRENT MEDICATIONS:  Oral contraceptives.   ALLERGIES:  PENICILLIN and CLINDAMYCIN, both of which cause hives and  shortness of breath.   SOCIAL HISTORY:  The patient smokes about two to three packages of  cigarettes daily.   REVIEW OF SYSTEMS:  Fourteen-point comprehensive review of systems  within normal limits.   FAMILY HISTORY:  The patient's mother has hypertension and ovarian  carcinoma.   PHYSICAL EXAMINATION:  VITAL SIGNS:  Blood pressure is 122/76, weight  144 pounds, height 5 feet 4 inches, pulse 68 and regular.  HEENT:  Grossly normal.  BREASTS: Without masses, discharge, thickenings, or tenderness.  CHEST:  Clear to percussion and auscultation.  CARDIOVASCULAR:  Without murmurs or enlargements.  Regular rate and  rhythm.  EXTREMITIES/LYMPHATICS/SKIN/NEUROLOGICAL/MUSCULOSKELETAL: Within normal  limits.  ABDOMEN:  Soft without gross hepatosplenomegaly.  PELVIC:  Normal Pap smear.  External Genitalia, vulva and vagina normal.  Cervix smooth without erosions or lesions.  Uterus small, anteverted and  flexed.  Both adnexa palpable, found to be normal.   IMPRESSION AND PLAN:  Request for sterilization.   PLAN:  Bilateral tubal ligation.  The patient understands that there is  a failure 1 in 1000 and laparoscopic risks including bleeding, infection  at wound site and possible injury to bowel and/or vascular  complications.  The patient was offered the option of continuing on oral  contraceptives which she has declined and wishes sterilization.  She  understands this will render her sterile and is not designed to be  reversible      Ginger Carne, MD  Electronically Signed     SHB/MEDQ  D:  11/03/2006  T:  11/04/2006  Job:  [id number]

## 2010-06-09 NOTE — Assessment & Plan Note (Signed)
[name], [name]              ACCOUNT NO.:  [id number]   MEDICAL RECORD NO.:  [id number]          PATIENT TYPE:  POB   LOCATION:  [facility]         [facility]AC[facility]   PHYSICIAN:  Tinnie Gens, MD        DATE OF BIRTH:  21-Jan-1981   DATE OF SERVICE:  02/28/2008                                  CLINIC NOTE   CHIEF COMPLAINT:  Yearly exam.   HISTORY OF PRESENT ILLNESS:  The patient is a 30 year old gravida 2,  para 2 who has been treated for endometriosis and has chronic pelvic  pain.  She has been offered LAVH and Ortho in the past with Dr.  Mia Creek, but the patient has declined this and she does not really want  to have hysterectomy unless there is no other alternatives.  She has  currently been on Ortho Tri-Cyclen despite having a BTL to try to  control her pelvic pain, but she is not taking it continuously.  The  patient continues to have pain with intercourse, right-sided low pelvic  pain all the time.   PAST MEDICAL HISTORY:  Significant for B12-deficient anemia and migraine  headaches.   PAST SURGICAL HISTORY:  She had a cholecystectomy, BTL, breast  reduction, treatment of endometriosis, and wisdom teeth removal.   MEDICATIONS:  She is on Topamax 25 mg 1 p.o. b.i.d., vitamin D  injections twice monthly, Ortho Tri-Cyclen once daily.   ALLERGIES:  PENICILLIN and CLINDAMYCIN.   Family history of Wilson disease in her father.  She is status post  testing for this and it is negative.  Her father has now undergone a  liver transplant.  Renal carcinoma and melanoma in her mother.  Breast  cancer in her maternal grandmother.  Mother also has MS.  Colon cancer  in her grandfather.   SOCIAL HISTORY:  She works at a [employer] in Clinical biochemist.  She lives with her husband and 2 kids.  She quit smoking 3 years ago.  Social alcohol user.   OBSTETRICAL HISTORY:  G2, P2, 2 vaginal deliveries.   GYNECOLOGICAL HISTORY:  Significant for endometriosis.  No  history of  abnormal Pap smears.   PREVENTIVE CARE:  The patient has undergone a colonoscopy in 2008 which  showed polyps.  She is due for another colonoscopy this year.   A 14-point review of systems is reviewed, is positive for headache and  chest pain and shortness of breath.  She has a history of panic attack  and GERD and hiatal hernia.  Her primary care physician takes care of  most of these things for her.   PHYSICAL EXAMINATION:  VITAL SIGNS:  Her pulse is 79, blood pressure  122/82, weight 149.  GENERAL:  She is well-developed, well-nourished female in no acute  distress.  HEENT:  Normocephalic, atraumatic.  Sclerae anicteric.  NECK:  Supple.  Normal thyroid.  LUNGS:  Clear bilaterally.  CV:  Regular rate and rhythm with no murmurs, rubs, or gallops.  ABDOMEN:  Soft, nontender, nondistended.  EXTREMITIES:  No cyanosis, clubbing, or edema.  2+ distal pulses.  BREASTS:  Symmetric with everted nipples.  A well-healed scars are noted  from  breast reduction along the lower edge of the scars.  She is noted  to have fibrocystic change bilaterally in the lower and outer quadrants.  No definitive masses, supraclavicular or axillary adenopathy is noted.  GU:  Normal external female genitalia.  BUS is normal.  Vagina is pink  and rugated.  Cervix is parous without lesion.  The uterus is small,  anteverted, diffusely tender and the ovaries feel normal.  They are not  enlarged but they are diffusely tender as well.   IMPRESSION:  1. Yearly exam with Pap.  2. History of endometriosis.  3. History of migraine headaches.   PLAN:  1. I advised the patient there are many things to be done to her      endometriosis and her chronic pelvic pain, including trial of      continuous OCs which she will start now.  If this fails or recur in      2-3 months, we could do trial of Depo and Lupron followed by      continuous OCs to see if this takes away her pain.  This is always      short of  hysterectomy which is another alternative for her,      although the patient clearly does not want that alternative at this      time.  2. Pap smear today.  3. Follow up colonoscopy as needed.  4. The patient may return to see Remonia Richter for headaches as      needed.  5. I have referred her for Dermatology to do cancer screening.           ______________________________  Tinnie Gens, MD     TP/MEDQ  D:  02/28/2008  T:  02/29/2008  Job:  [id number]

## 2010-06-09 NOTE — Assessment & Plan Note (Signed)
NAMEJASIYA, Susan              ACCOUNT NO.:  [id number]   MEDICAL RECORD NO.:  [id number]          PATIENT TYPE:  POB   LOCATION:  [facility] at [facility]         FACILITY:  [facility]   PHYSICIAN:  Matt Holmes, N.P.       DATE OF BIRTH:  1980-06-22   DATE OF SERVICE:                                  CLINIC NOTE   [name] is here today for her annual exam and Pap smear.  She also  has a chief complaint today of left flank pain.  It started over the  weekend actually as right flank pain, and has moved now to the left  side.  She states that she has a history of kidney stones, one small one  that was seen by a radiologist in [place] in August 2007, and she  also has a history of gastric ulcers.  She is not sure if these symptoms  are related to either.  She denies breast, urinary tract symptoms, and  vaginal itching, burning, or discharge.   OB-GYN HISTORY:  She has had 2 vaginal deliveries in 1997 and in 1999.  The patient had an abnormal Pap smear in 1999 after her delivery, and  all Pap smears since that time were within normal limits.   SOCIAL HISTORY:  The patient has now been tobacco free for 1 year.   PAST SURGICAL HISTORY:  The patient underwent laparoscopy in November  2006 by Dr. Mia Creek for chronic pelvic pain and dysmenorrhea.  She had  right adnexal adhesions and pelvic endometriosis.   FAMILY HISTORY:  The patient's mother has hypertension and ovarian  cancer.  She reports that her mother also had kidney cancer, her brother  had kidney failure, her father has just had some sort of procedure for  kidney disease.   REVIEW OF SYSTEMS:  Significant only for the left flank pain, stating  that it does not feel like a kidney infection.   PHYSICAL EXAMINATION:  GENERAL:  [name] is a 30 year old female,  gravida 2, para 2, who is well-nourished and well-developed.  She states  the first day of her last menstrual period was March 07, 2006.  Her  last Pap smear was  2006.  VITAL SIGNS:  Pulse 100, blood pressure 109/88, height 5 feet 4 inches.  She had a urinalysis today which was negative.  SHE STATES THAT SHE IS  ALLERGIC TO PENICILLIN AND CLINDAMYCIN.  HEENT:  Within normal limits without thyroid enlargement.  BREAST:  Bilaterally soft without masses, nodes, or nipple discharge.  LUNGS:  Bilaterally clear to A&P.  HEART:  Rate and rhythm were regular without murmur, gallop, or cardiac  enlargement.  ABDOMEN:  Soft and scaphoid without masses or organomegaly.  It is noted  that she has left flank pain, midback, with a little tenderness  radiating down to the left side.  The right is clear.  PELVIS:  External genitalia within normal limits for a female.  The  vagina is clean and rugose.  The cervix is clean and nontender and  nonfriable.  The uterus is firm and nontender.  Adnexa bilaterally clear  without masses and only slight tenderness on the left.  IMPRESSION:  1. Mid to lower left back pain with known kidney stone.  2. History of endometriosis, treated with oral contraceptives.  3. Strong family history of kidney disease.   PLAN:  1. Continue Loestrin 24s and long-cycle 2 packs at a time.      Prescription was written that way and given refills for the year.  2. Appointment was made with Dr. Milinda Antis at [facility] for followup of      this left back pain.  The appointment was made for February 22 at 2      p.m.  3. Pap smear.  Results will be back in 1 to 2 weeks.  We will notify      the patient if the results are abnormal.  4. The patient is to return in 1 year for her yearly Pap and GYN exam.           ______________________________  Matt Holmes, N.P.     EMK/MEDQ  D:  03/15/2006  T:  03/15/2006  Job:  [id number]

## 2010-06-09 NOTE — Assessment & Plan Note (Signed)
[name], [name]              ACCOUNT NO.:  [id number]   MEDICAL RECORD NO.:  [id number]          PATIENT TYPE:  POB   LOCATION:  [facility] at [facility]         FACILITY:  [facility]   PHYSICIAN:  Scheryl Darter, MD       DATE OF BIRTH:  12/28/1980   DATE OF SERVICE:  06/05/2008                                  CLINIC NOTE   The patient returns today after being seen in [facility] due to pelvic pain on  April 15.  She has been having pain for several months.  She states that  she had diagnosis of endometriosis on laparoscopy.  She has had tubal  ligation on February 2008.  She had been on oral contraceptives because  of diagnosis of endometriosis.  She had received Lupron Depot.  She has  pain in left lower quadrant and in her back.  She also has urinary  frequency and some dysuria.   PAST MEDICAL HISTORY:  B12 deficient anemia and migraine headache.   PAST SURGICAL HISTORY:  Cholecystectomy, laparoscopic tubal ligation,  operative laparoscopy, breast reduction, wisdom teeth removal.   MEDICATIONS:  1. Topamax 25 mg b.i.d.  2. Vitamin D injection twice a month.  3. She stopped Ortho-Tri-Cyclen on February 3.   PHYSICAL EXAMINATION:  GENERAL:  The patient is no acute distress.  PELVIC:  External genitalia, vagina, and cervix appeared normal.  She  reports some mild cervical motion tenderness and appears to have some  bladder tenderness.  Uterus is retroverted, normal size, no adnexal  masses or adnexal tenderness.   IMPRESSION:  1. Pelvic pain possibly due to endometriosis.  2. Urinary symptoms suggestive of possible bladder origin for pain.   PLAN:  With history of endometriosis, I offered Lupron Depot.  Recommended 3.75 mg injection to start.  A prescription for Aygestin 5  mg a day.  She will return for Lupron injection.  Recommended 74-month  therapy if she tolerates the treatment.  She may need referral to  urologist to evaluate for possible interstitial cystitis as well.      Scheryl Darter, MD     JA/MEDQ  D:  06/05/2008  T:  06/06/2008  Job:  [id number]

## 2010-06-09 NOTE — Op Note (Signed)
[name], SKAFF              ACCOUNT NO.:  [id number]   MEDICAL RECORD NO.:  [id number]          PATIENT TYPE:  AMB   LOCATION:  [facility]                           FACILITY:  [facility]   PHYSICIAN:  Ginger Carne, MD  DATE OF BIRTH:  [birth year]   DATE OF PROCEDURE:  11/04/2006  DATE OF DISCHARGE:                               OPERATIVE REPORT   PREOPERATIVE DIAGNOSIS:  Sterilization.   POSTOPERATIVE DIAGNOSES:  1. Sterilization.  2. Stage 1 endometriosis.   OPERATION PERFORMED:  Laparoscopic bilateral tubal banding.   SURGEON:  Ginger Carne, M.D.   ASSISTANT:  None.   COMPLICATIONS:  None immediate.   ESTIMATED BLOOD LOSS:  Minimal.   SPECIMEN:  None.   OPERATIVE FINDINGS:  External genitalia, vulva and vagina were normal.  Cervix was smooth without erosions or lesions.   Laparoscopic evaluation demonstrated both tubes from their isthmus to  their fimbriated ends separate and apart from their respective round  ligaments.  There was stage I endometriosis with clear and red punctate  lesions noted diffusely around the broad ligaments, anterior peritoneal  reflection and on the posterior aspect of the uterus.  No evidence of  femoral, inguinal or obturator hernias.  Upper abdomen appeared normal.  Appendix appeared normal.  Large and small bowel grossly appeared  normal.  Uterus was normal in size with a smooth surface.   DESCRIPTION OF THE OPERATION:  The patient was prepped and draped in the  usual fashion, and placed in the lithotomy position.  Betadine solution  was used for an antiseptic and the patient was catheterized prior to  procedure.  After adequate general anesthesia a tenaculum placed on the  anterior  lip of the cervix and a Hickman tenaculum was placed in  the  endocervical canal.  Afterwards a vertical infraumbilical incision was  made and the Veress needle placed in the abdomen.  Opening and closing  pressures were 10-15 mmHg.  Niedner release trocar  was placed in same  incision.  Laparoscope placed in the trocar sleeve.  A 7 mm port was  made left lower quadrant under direct visualization.  After identifying the tubes as described above the isthmus ampullary  junction was banded with Falope rings no active bleeding noted after.   Photographs were taken of endometriosis.   The gas was released.  The trocars were removed.  Closure of the 10-mm  fascial site was with a 0-Vicryl suture and 4-0 Vicryl for subcuticular  closure.   Instrument and sponge counts were correct.   The patient tolerated procedure well was taken to the post anesthesia  recovery room in excellent condition.      Ginger Carne, MD  Electronically Signed     SHB/MEDQ  D:  11/04/2006  T:  11/05/2006  Job:  [id number]

## 2010-06-09 NOTE — Assessment & Plan Note (Signed)
[facility]                                 ON-CALL NOTE   [name], [name]                     MRN:          [id number]  DATE:10/12/2007                            DOB:          May 23, 1980    PRIMARY:  Marne A. Tower, MD   SUBJECTIVE:  Severe ear pain.   ASSESSMENT AND PLAN:  Possible ear infection.  Recommended evaluation  tonight at an urgent care, if the pain is severe or may use ibuprofen  800 mg every 8 hours through the night and be seen in the office  tomorrow.      Kerby Nora, MD  Electronically Signed    AB/MedQ  DD: 10/12/2007  DT: 10/13/2007  Job #: [id number]

## 2010-06-12 NOTE — Op Note (Signed)
[name], [name]                       ACCOUNT NO.:  [id number]   MEDICAL RECORD NO.:  [id number]                   PATIENT TYPE:  AMB   LOCATION:  [facility]                                  FACILITY:  [facility]   PHYSICIAN:  Brantley Persons, M.D.             DATE OF BIRTH:  04/05/1980   DATE OF PROCEDURE:  01/22/2002  DATE OF DISCHARGE:                                 OPERATIVE REPORT   PREOPERATIVE DIAGNOSES:  1. Bilateral macromastia.  2. Right chest accessory nipple.   POSTOPERATIVE DIAGNOSES:  1. Bilateral macromastia.  2. Right chest accessory nipple.   PROCEDURES:  1. Bilateral reduction mammoplasties.  2. Excision of 2.5 cm right chest accessory nipple with complex closure of     3.0 cm incision.   SURGEON:  Brantley Persons, M.D.   ASSISTANT:  Raynald Kemp, M.D.   ANESTHESIA:  General endotracheal.   ANESTHESIOLOGIST:  Bedelia Person, M.D.   ESTIMATED BLOOD LOSS:  200 cc.   FLUIDS REPLACED:  Approximately 3300 cc crystalloid.   URINE OUTPUT:  300 cc.   COMPLICATIONS:  None.   JUSTIFICATION FOR OVERNIGHT STAY:  Progressive pain control along with  ambulation and monitoring of the nipples and breast flaps.   INDICATION FOR PROCEDURE:  The patient is a 30 year old Caucasian female who  has bilateral macromastia that is clinically symptomatic.  She complains of  neckaches, headaches, backaches, shoulder strap groove marks, and  intertriginous skin rashes.  She additionally has a right chest accessory  nipple present below the inframammary crease.  She presents to undergo  bilateral reduction mammoplasties as well as excision of the accessory  nipple.   DESCRIPTION OF PROCEDURE:  The patient was marked in the preop holding area  in the pattern of Wise for the future bilateral reduction mammoplasties.  She was then taken back to the OR, placed on the table in supine position.  After adequate general endotracheal anesthesia was obtained, the patient's  chest  was prepped with Betadine and alcohol and draped in a sterile fashion.  The bases of the breasts were injected with 1% lidocaine with epinephrine.  After adequate hemostasis had taken effect, the procedure was begun.   First the right breast reduction was performed.  The nipple was  marked with  a 38 mm nipple marker.  This was then incised and the skin de-epithelialized  around the nipple down to the inframammary crease in inferior pedicle  pattern.  Next the medial, superior, and lateral skin flaps were elevated  down to the chest wall.  The excess fat and glandular tissue was removed  from the inferior pedicle.  The nipple was then examined and found to be  pink and viable.  The wound was irrigated with saline irrigation.  Meticulous hemostasis was obtained with the Bovie electrocautery.  The  inferior pedicle was centralized using 3-0 Prolene suture.  A #10 JP flat,  fully-fluted drain was then placed  into the wounds.  The skin flaps were  brought together at the inverted T junction with a 2-0 Prolene suture.  The  incision was stapled for temporary closure.   Attention was then turned to the left breast.  The nipple was marked with a  38 mm nipple marker.  This was then incised and the skin de-epithelialized  around the nipple down to the inframammary crease in inferior pedicle  pattern.  Next the medial, superior, and lateral skin flaps were elevated  down to the chest wall.  The excess fat and glandular tissue was removed  from the inferior pedicle.  The nipple was then examined and found to be  pink and viable.  The wound was irrigated with saline irrigation.  Meticulous hemostasis was obtained with the Bovie electrocautery.  The  inferior pedicle was centralized using 3-0 Prolene suture.  A #10 JP flat,  fully-fluted drain was then placed into the wounds.  The skin flaps were  brought together at the inverted T junction with a 2-0 Prolene suture.  The  incision was stapled for  temporary closure.   The breasts were then compared and found to have good shape and symmetry.  All the staples were removed and the incisions were closed using 3-0  Monocryl in the dermal layer, followed by 4-0 Monocryl running  intracuticular stitch on the skin.  The patient was then placed in the  upright position.  The future locations of the nipple-areolar complexes was  marked on each breast using the 38 mm nipple marker.  She was then placed  back into the recumbent position.   First the right future nipple-areolar complex was incised on the right  breast mound.  This tissue was excised full-thickness.  The nipple was  examined and found to be pink and viable and brought out into this aperture  and sewn in place using 4-0 Monocryl in the dermal followed by a 5-0  Monocryl running intracuticular stitch on the skin.  In a likewise fashion,  the future nipple-areolar complex was incised on the left breast mound.  The  nipple was examined and found to be pink and viable and brought out into  this aperture and sewn in place using 4-0 Monocryl in the dermal layer,  followed by a 5-0 Monocryl running intracuticular stitch on the skin.  The  JP drain was sewn in place using 3-0 nylon suture.  The area of the  accessory nipple on the right chest below the inframammary crease had been  injected with 1% lidocaine with epinephrine.  After adequate hemostasis and  anesthesia had taken effect, the procedure was begun.  The accessory nipple  was excised with 1-2 mm margins of clear skin around it.  This total  excision measured 2.5 cm.  The specimen was then passed off the table to  undergo pathologic evaluation.  The wound hemostasis was obtained with the  Bovie electrocautery.  The wound was then closed in complex fashion.  The  superficial layer and deeper subcutaneous tissues were closed with 3-0 Monocryl suture.  The dermal layer was then also closed with a 3-0 Monocryl  suture.  The skin  was then closed with a 4-0 Monocryl running intracuticular  stitch.  Benzoin and Steri-Strips were used to dress all of the incisions  and the nipples additionally with bacitracin ointment and Adaptic.  Four by  fours were then placed over the wounds and the patient was placed in a light  postop support bra.  There were no complications.  The patient tolerated the  procedure well.  Final needle and sponge  counts were reported to be correct at the end of the case.  She was then  awakened from the general anesthesia, taken to the recovery room in stable  condition.  Discharge is planned for the morning.  She will remain in the  RCC overnight for progressive pain control along with ambulation and  monitoring of the nipples and breast flaps.                                                Brantley Persons, M.D.    MC/MEDQ  D:  01/22/2002  T:  01/23/2002  Job:  [id number]   cc:   Raynald Kemp, M.D.  [address]  [place]  [place] [address]  Fax: [phone]

## 2010-06-12 NOTE — H&P (Signed)
[name], [name]              ACCOUNT NO.:  [id number]   MEDICAL RECORD NO.:  [id number]          PATIENT TYPE:  AMB   LOCATION:  [facility]                           FACILITY:  [facility]   PHYSICIAN:  Ginger Carne, MD  DATE OF BIRTH:  [birth year]   DATE OF ADMISSION:  12/04/2004  DATE OF DISCHARGE:                                HISTORY & PHYSICAL   CHIEF COMPLAINT:  Chronic pelvic pain.   HISTORY OF PRESENT ILLNESS:  This patient is a 30 year old gravida 2, para 2-  0-0-2, Caucasian female admitted because of a six-month history of worsening  bilateral pelvic pain.  She has noted the onset of dyspareunia over the past  six months.  The patient states that her lower pelvic discomfort has been  exacerbated over the past two to three months as well.  She has had an  intrauterine device placed about two years ago and has an occasional menses.  The patient has no genitourinary or gastrointestinal sources for her  discomfort.   OB/GYN HISTORY:  The patient has had two vaginal deliveries in 1997 and  1999.   PAST SURGICAL HISTORY:  A laparoscopic cholecystectomy in 2001, bilateral  breast reduction in 2004, and oral surgery in 2003.   ALLERGIES:  PENICILLIN and CLINDAMYCIN.   MEDICATIONS:  Amitriptyline for occasional migraines and Nexium 40 mg daily.   SOCIAL HISTORY:  The patient smokes two to three cigarettes daily.  Denies  alcohol or drug abuse.   FAMILY HISTORY:  The patient's mother has hypertension and ovarian  carcinoma.   REVIEW OF SYSTEMS:  Negative.   PHYSICAL EXAMINATION:  VITAL SIGNS:  Blood pressure is 110/60, weight 141  pounds, height 5 feet 4 inches.  HEENT:  Grossly normal.  CHEST:  Clear.  CARDIAC:  Without murmurs or enlargements.  BREASTS:  Without masses, discharge, thickenings or tenderness.  EXTREMITIES, LYMPHATIC, SKIN, NEUROLOGIC, MUSCULOSKELETAL:  Normal.  ABDOMEN:  Soft without gross hepatosplenomegaly.  PELVIC:  External genitalia, vulva and vagina  normal.  Cervix smooth without  erosions or lesions.  Uterus is palpable, 1+ tenderness.  Both adnexa  palpable without masses and 1+ tender.  No evidence of nodularity noted in  the posterior fornix.  The patient has a normal Pap smear.  RECTAL:  Hemoccult-negative without masses.   IMPRESSION:  Chronic pelvic pain.   PLAN:  The patient will undergo an operative laparoscopy to define the  etiology of her discomfort.  The nature of said procedure discussed in  detail with the patient.  If endometriosis is noted, excision of said  lesions will be performed at the same time.  In addition, the patient has  requested her intrauterine device be removed at the time of surgery.      Ginger Carne, MD  Electronically Signed     SHB/MEDQ  D:  12/03/2004  T:  12/03/2004  Job:  [id number]

## 2010-06-12 NOTE — Assessment & Plan Note (Signed)
[facility]                                 ON-CALL NOTE   [name], [name]                       MRN:          [id number]  DATE:04/02/2006                            DOB:          05/11/1980    TIME OF CALL:  Called at 10:24 a.m.   Patient of Dr. Milinda Page.  Called from [phone] complaining of sore throat,  chest congestion, and nasal drainage.  She is coming into the clinic  today to be evaluated.     Lelon Perla, DO  Electronically Signed    [name]  DD: 04/02/2006  DT: 04/02/2006  Job #: [id number]   cc:   Susan A. Milinda Antis, MD

## 2010-06-12 NOTE — Op Note (Signed)
NAMESHERRIE, MARSAN              ACCOUNT NO.:  [id number]   MEDICAL RECORD NO.:  [id number]          PATIENT TYPE:  AMB   LOCATION:  [facility]                           [facility]ACILITY[facility]   PHYSICIAN:  Ginger Carne, MD  DATE OF BIRTH:  Jan 07, 1981   DATE OF PROCEDURE:  12/04/2004  DATE OF DISCHARGE:                                 OPERATIVE REPORT   PREOPERATIVE DIAGNOSIS:  Chronic pelvic pain.   POSTOPERATIVE DIAGNOSES:  1.  Chronic pelvic pain.  2.  Pelvic endometriosis.  3.  Right ovarian adhesions.   PROCEDURES:  1.  Laparoscopic lysis of adhesions.  2.  Removal of intrauterine device.   SURGEON:  Ginger Carne, M.D.   ASSISTANT:  None.   COMPLICATIONS:  None immediate.   ESTIMATED BLOOD LOSS:  Minimal.   SPECIMENS:  None.   ANESTHESIA:  General.   OPERATIVE FINDINGS:  External genitalia, vulva and vagina normal.  The  cervix smooth without erosions or lesions.  Laparoscopic evaluation  demonstrated evidence for adhesive disease between the right ovary and its  peritoneal sidewall.  The ovary was twisted on its pedicle about 180  degrees, and this was appropriately dealt with in the following paragraph.  Areas of endometriosis were noted as red and white lesions along the broad  ligaments, uterosacral ligaments and right ovary as well as the right aspect  of the bladder flap.  Appendix normal.  Large and small bowel grossly  normal.  No evidence of femoral, inguinal or obturator hernias.  The patient  had an intrauterine device which she requested to have removed, and this was  also performed.   OPERATIVE PROCEDURE:  Patient prepped and draped in the usual fashion and  placed in lithotomy position, Betadine solution used for antiseptic.  Patient catheterized prior to the procedure.  After adequate general  anesthesia, a tenaculum placed on the anterior lip of the cervix and  following this, a Hulka tenaculum placed in the endocervical canal.   A vertical  infraumbilical incision was made and a Veress needle placed in  the abdomen.  Opening and closing pressures 10-15 mmHg.  A needle release  trocar placed in the same incision and the laparoscope placed in the trocar  sleeve.  A 5 mm port was made in the left lower quadrant and inspection of  the pelvic contents was then carried out, photography taken.  Right ovarian  pelvic-peritoneal adhesions were then dissected and cut.  Following this gas  released, trocars removed, closure of the 10 mm fascia site with 0 Vicryl  suture and 4-0 Vicryl for a subcuticular closure.  The instrument and sponge  count were correct.  The patient tolerated the procedure well, was returned  to the postanesthesia recovery room in excellent condition.      Ginger Carne, MD  Electronically Signed     SHB/MEDQ  D:  12/04/2004  T:  12/05/2004  Job:  [id number]

## 2010-06-25 ENCOUNTER — Ambulatory Visit: Payer: BC Managed Care – PPO | Admitting: Gastroenterology

## 2010-08-06 NOTE — Letter (Signed)
April 15, 2010  ATTN:    [employer]  RE:      [name], [name] [facility] # [id number]          Date of Service:  04/15/2010   To Whom it May Concern:  This letter is written in support of medical associates for tubal reversal.  This patient underwent a tubal ligation with Falope rings in 2007 and 2008.  She states immediately  2 weeks post tubal ligation, she developed acute onset of pelvic pain.  This pain has been ongoing for the last 4-5 years, despite treatment with nonsteroidal anti- inflammatory medications and oral contraceptives use cyclically or continuously.  The patient does not desire major hysterectomy and continuously interested in birth control, does not want more children. The patient reports chronic pelvic pain, painful intercourse, disruption of daily life, fatigue, and feeling bloating with pelvic and leg pain. Her few symptoms came right after the tubal ligation, it is very possible that this could be related to post tubal ligation syndrome and her symptoms would be reversed by having the surgery.  If you have other questions or concerns, please let us know, you may call our office by dialing [phone].         ______________________________ Tinnie Gens, MD    TP/MEDQ  D:  04/15/2010  T:  04/16/2010  Job:  [id number]

## 2010-08-07 NOTE — Assessment & Plan Note (Signed)
[name], [name]              ACCOUNT NO.:  [id number]  MEDICAL RECORD NO.:  [id number]          PATIENT TYPE:  POB  LOCATION:  [facility]         FACILITY:  [facility]  PHYSICIAN:  Tinnie Gens, MD        DATE OF BIRTH:  [birth year]  DATE OF SERVICE:  04/15/2010                                 CLINIC NOTE  CHIEF COMPLAINT:  Yearly exam.  HISTORY OF PRESENT ILLNESS:  The patient is a 30 year old gravida 2, para 2 who has chronic pelvic pain.  She notes that the pain started approximately 2 weeks after tubal ligation in 2007 or 2008.  She notes approximately 2 weeks after that she began having continuous pain.  She has less pain with her cycles and she does without her cycles.  She also reports something like there is a pinching, fatigue, severe pelvic pain, dyspareunia, and all of her symptoms are related after her tubal ligation started.  She eventually needed tubal reversal but needs a letter of medical necessity written to her insurance company.  She does not desire definitive hysterectomy.  She has tried ibuprofen and oral contraceptives without significant relief.  PAST MEDICAL HISTORY:  She has a history of B12 deficiency which has resolved.  She has occasional migraine headaches and she has a new diagnosis of nephrolithiasis.  PAST SURGICAL HISTORY:  She has had cholecystectomy, bilateral tubal ligation, breast reduction, laparoscopy with takedown of adhesions and treatment of endometriosis and wisdom tooth removal.  MEDICATIONS:  She is on Prilosec OTC b.i.d. and ibuprofen as needed for pain.  ALLERGIES: 1. PENICILLIN. 2. CLINDAMYCIN.  FAMILY HISTORY:  Her father has Andrey Campanile disease for which she is negative.  Renal carcinoma and melanoma in her mother and also has multiple sclerosis. Her mother had a history of ovarian cancer.  History of colon cancer in her father and breast cancer in the maternal grandmother.  SOCIAL HISTORY:  She works in Audiological scientist,  Clinical biochemist.  She lives with her husband and she quit smoking 5 years ago.  Social alcohol user, no other drugs.  OBSTETRICAL HISTORY:  G2, P2-0-0-2 vaginal deliveries.  GYNECOLOGICAL HISTORY:  Significant for endometriosis and no history of abnormal Pap smear, preventive care.  The patient has undergone colonoscopy in 2008 which showed polyps.  She also had her esophagus dilated and had an EGD in November 2011.  REVIEW OF SYSTEMS:  Her 14-point review of systems was reviewed and negative for significant headache or vision changes.  She denies chest pain, shortness of breath, fevers, chills, nausea, vomiting, diarrhea, constipation, blood in stool, blood during urine, or breast masses.  She does see a primary care physician who does manual blood work for her.  PHYSICAL EXAMINATION:  VITAL SIGNS:  Today, her vitals are as noted in the chart. GENERAL:  She is a well developed, well nourished in no acute distress. HEENT:  Normocephalic and atraumatic.  Sclerae anicteric. NECK:  Supple.  Normal thyroid. LUNGS:  Clear bilaterally. CV:  Regular rate and rhythm without rubs, gallops, or murmurs. ABDOMEN:  Soft, nontender, and nondistended. EXTREMITIES:  No cyanosis, clubbing, or edema. BREASTS:  Symmetric with everted nipples.  We will have the scars from the breast  reduction over the lower edge of the breast.  She does have fibrocystic change bilaterally in the lower and outer quadrant.  No definitive masses.  No supraclavicular or axillary adenopathy is noted. GU:  Normal external female genitalia.  Vagina is pink and rugated. Cervix parous without lesion.  Uterus is small, retroverted and not significantly tender.  No adnexal mass or tenderness.  IMPRESSION: 1. Yearly exam with Pap. 2. Chronic pelvic pain, questionably related to post-tubal ligation     syndrome. 3. History of migraine headache. 4. Nephrolithiasis. 5. History of B12 anemia, resolved. 6. Gastroesophageal  reflux disease.  PLAN: 1. Pap smear today. 2. We will write a letter to her insurance and apparently see if she     qualifies for post-tubal ligation syndrome and tubal reversal     surgery.  She is interested in continuing birth control at this     point and desires informational ParaGard IUD.  The patient is to     return in 1 year for another annual exam.          ______________________________ Tinnie Gens, MD    TP/MEDQ  D:  04/15/2010  T:  04/16/2010  Job:  [id number]

## 2010-08-17 ENCOUNTER — Telehealth: Payer: Self-pay | Admitting: *Deleted

## 2010-08-17 NOTE — Telephone Encounter (Signed)
Pt is working in [employer] now and is unable to come here for her B12 injections.  She is asking if if could be set up so that she can go to the [employer] office for these.  She would need to go during her lunch of 1:30-2:30.  Please advise.

## 2010-08-17 NOTE — Telephone Encounter (Signed)
I don't know what their policy is -- will send this to Jamesetta So to investigate option

## 2010-08-18 ENCOUNTER — Other Ambulatory Visit: Payer: BC Managed Care – PPO

## 2010-08-26 NOTE — Telephone Encounter (Signed)
Thanks - I agree with that advice

## 2010-08-26 NOTE — Telephone Encounter (Signed)
Pt called back asking about the status of her request.  I advised her to call the [place] office so that they can check with their manager.

## 2010-10-16 ENCOUNTER — Other Ambulatory Visit: Payer: BC Managed Care – PPO

## 2010-10-23 ENCOUNTER — Encounter: Payer: BC Managed Care – PPO | Admitting: Family Medicine

## 2010-10-28 ENCOUNTER — Telehealth: Payer: Self-pay | Admitting: Family Medicine

## 2010-10-28 DIAGNOSIS — Z0000 Encounter for general adult medical examination without abnormal findings: Secondary | ICD-10-CM

## 2010-10-28 DIAGNOSIS — E538 Deficiency of other specified B group vitamins: Secondary | ICD-10-CM

## 2010-10-28 DIAGNOSIS — E876 Hypokalemia: Secondary | ICD-10-CM

## 2010-10-28 NOTE — Telephone Encounter (Signed)
Message copied by Judy Pimple on Wed Oct 28, 2010  1:29 PM ------      Message from: Baldomero Lamy      Created: Wed Oct 28, 2010 10:27 AM      Regarding: cpx labs Mon 10/8       Please order  future cpx labs for pt's upcomming lab appt.      Thanks      Rodney Booze

## 2010-11-02 ENCOUNTER — Other Ambulatory Visit (INDEPENDENT_AMBULATORY_CARE_PROVIDER_SITE_OTHER): Payer: BC Managed Care – PPO

## 2010-11-02 DIAGNOSIS — Z0000 Encounter for general adult medical examination without abnormal findings: Secondary | ICD-10-CM

## 2010-11-02 DIAGNOSIS — E538 Deficiency of other specified B group vitamins: Secondary | ICD-10-CM

## 2010-11-02 DIAGNOSIS — E876 Hypokalemia: Secondary | ICD-10-CM

## 2010-11-02 LAB — CBC WITH DIFFERENTIAL/PLATELET
Basophils Absolute: 0 10*3/uL (ref 0.0–0.1)
Eosinophils Absolute: 0.1 10*3/uL (ref 0.0–0.7)
HCT: 34.2 % — ABNORMAL LOW (ref 36.0–46.0)
Lymphs Abs: 1.3 10*3/uL (ref 0.7–4.0)
MCHC: 33.4 g/dL (ref 30.0–36.0)
MCV: 90.8 fl (ref 78.0–100.0)
Monocytes Absolute: 0.5 10*3/uL (ref 0.1–1.0)
Platelets: 195 10*3/uL (ref 150.0–400.0)
RDW: 13.3 % (ref 11.5–14.6)

## 2010-11-02 LAB — COMPREHENSIVE METABOLIC PANEL
ALT: 11 U/L (ref 0–35)
AST: 25 U/L (ref 0–37)
Alkaline Phosphatase: 43 U/L (ref 39–117)
Glucose, Bld: 82 mg/dL (ref 70–99)
Sodium: 141 mEq/L (ref 135–145)
Total Bilirubin: 0.8 mg/dL (ref 0.3–1.2)
Total Protein: 6.7 g/dL (ref 6.0–8.3)

## 2010-11-02 LAB — LIPID PANEL
Cholesterol: 125 mg/dL (ref 0–200)
LDL Cholesterol: 66 mg/dL (ref 0–99)
VLDL: 6.8 mg/dL (ref 0.0–40.0)

## 2010-11-05 LAB — URINALYSIS, ROUTINE W REFLEX MICROSCOPIC
Glucose, UA: NEGATIVE
Leukocytes, UA: NEGATIVE
Nitrite: NEGATIVE
Specific Gravity, Urine: 1.015
pH: 6.5

## 2010-11-05 LAB — URINE CULTURE

## 2010-11-05 LAB — BASIC METABOLIC PANEL
CO2: 22
Calcium: 9.3
Creatinine, Ser: 0.6
GFR calc Af Amer: >60

## 2010-11-05 LAB — CBC
HCT: 37.5
Hemoglobin: 13
MCHC: 34.6
MCHC: 35.1
MCV: 87.2
RBC: 4.23
RBC: 4.3
RDW: 12.6
RDW: 12.9

## 2010-11-05 LAB — HCG, SERUM, QUALITATIVE: Preg, Serum: NEGATIVE

## 2010-11-05 LAB — URINE MICROSCOPIC-ADD ON

## 2010-11-09 ENCOUNTER — Encounter: Payer: Self-pay | Admitting: Family Medicine

## 2010-11-09 ENCOUNTER — Ambulatory Visit (INDEPENDENT_AMBULATORY_CARE_PROVIDER_SITE_OTHER): Payer: BC Managed Care – PPO | Admitting: Family Medicine

## 2010-11-09 DIAGNOSIS — D649 Anemia, unspecified: Secondary | ICD-10-CM

## 2010-11-09 DIAGNOSIS — E663 Overweight: Secondary | ICD-10-CM | POA: Insufficient documentation

## 2010-11-09 DIAGNOSIS — Z86010 Personal history of colonic polyps: Secondary | ICD-10-CM

## 2010-11-09 DIAGNOSIS — Z0000 Encounter for general adult medical examination without abnormal findings: Secondary | ICD-10-CM

## 2010-11-09 DIAGNOSIS — R5381 Other malaise: Secondary | ICD-10-CM

## 2010-11-09 DIAGNOSIS — E538 Deficiency of other specified B group vitamins: Secondary | ICD-10-CM

## 2010-11-09 DIAGNOSIS — R5383 Other fatigue: Secondary | ICD-10-CM

## 2010-11-09 DIAGNOSIS — E669 Obesity, unspecified: Secondary | ICD-10-CM | POA: Insufficient documentation

## 2010-11-09 DIAGNOSIS — Z23 Encounter for immunization: Secondary | ICD-10-CM

## 2010-11-09 MED ORDER — CYANOCOBALAMIN 1000 MCG/ML IJ SOLN
1000.0000 ug | Freq: Once | INTRAMUSCULAR | Status: AC
  Administered 2010-11-09: 1000 ug via INTRAMUSCULAR

## 2010-11-09 NOTE — Assessment & Plan Note (Signed)
Disc in detail risks of smoking and possible outcomes including copd, vascular/ heart disease, cancer , respiratory and sinus infections  Pt voices understanding  

## 2010-11-09 NOTE — Assessment & Plan Note (Signed)
Pt will call in the spring to schedule her 5 year test No bowel changes

## 2010-11-09 NOTE — Patient Instructions (Addendum)
We will do a nutritionist consult at check out (if insurance covers)  Take an over the counter multivitamin with iron daily  Start getting B12 shots monthly  Please call the [place] office to see if patient can get her B12 shots there-- and do not let patient leave until you get an answer, schedule next B12 shot in 1 month  You are due for a 5 year colonoscopy this April- so call us in march to schedule  Flu shot  Schedule labs in 2 months for anemia and B12 deficiency

## 2010-11-09 NOTE — Assessment & Plan Note (Signed)
Likely rel to B12 def and anemia  Will work on correcting both

## 2010-11-09 NOTE — Assessment & Plan Note (Signed)
Mild anemia with some restless leg symptoms  Will start mvi with iron daily and re check in several mo Suspect is iron def from menses

## 2010-11-09 NOTE — Progress Notes (Signed)
Subjective:    Patient ID: [name], female    DOB: 1980-12-24, 30 y.o.   MRN: [id number]  HPI Here for annual health mt exam  Had pap and gyn care with gyn physician in 3/12  104/66- bp ok  Wt is up 3 lb with bmi of 28 Was member of [employer] for a year- they closed down  In 1200 calorie diet  Was in biggest looser program - circuit training and running/ walking 5 times per week  5 meals per day  No caffeine  Still cannot loose wt   She did loose wt on topamax in the past  sensa gave her a headache   She thinks her ins would cover nutritionist consult   Lipids very good Lab Results  Component Value Date   CHOL 125 11/02/2010   HDL 51.80 11/02/2010   LDLCALC 66 11/02/2010   TRIG 34.0 11/02/2010   CHOLHDL 2 11/02/2010    Diet  B12 is low at 208 Lives in [place] -- and cannot get here , wants to get in [place]  Tired and trouble concentrating  Last shot 4/12 Is on PPI  Sleeps fine - and at least 8 hours a night  Hard time going to sleep  Legs are heavy and hands tingle  Also some burning in legs and restless leg syndrome   Lab Results  Component Value Date   WBC 4.2* 11/02/2010   HGB 11.4* 11/02/2010   HCT 34.2* 11/02/2010   MCV 90.8 11/02/2010   PLT 195.0 11/02/2010      Pap nl 3/12 Endometriosis -- is so / so  Was needing surgery - ins will not pay for  Does not want more children   Flu shot-wants to get that today  Td 2010  colonosc 4/08 with adenomatous polyp - due this coming April- she is aware to schedule   Patient Active Problem List  Diagnoses  . CAVERNOUS HEMANGIOMA, LIVER  . VITAMIN B12 DEFICIENCY  . SMOKER  . ALLERGIC RHINITIS  . GERD  . DYSPEPSIA  . ENDOMETRIOSIS  . DIARRHEA  . URINARY FREQUENCY, CHRONIC  . PERSONAL HISTORY OF COLONIC POLYPS  . RENAL CALCULUS, HX OF  . MIGRAINES, HX OF  . APHTHOUS ULCERS  . FIBROCYSTIC BREAST DISEASE  . Hypokalemia  . Vaginal discharge  . Fatigue  . Amenorrhea  . Routine general medical examination at a  health care facility  . Obesity  . Anemia   Past Medical History  Diagnosis Date  . GERD (gastroesophageal reflux disease)   . Endometriosis   . Fatigue a  . Kidney stone    Past Surgical History  Procedure Date  . Cholecystectomy   . Ovarian cyst surgery   . Laparoscopy 11/2004    endometriosis  . Breast surgery 2004    breast reduction   History  Substance Use Topics  . Smoking status: Former Smoker -- 0.2 packs/day    Quit date: 01/25/2005  . Smokeless tobacco: Not on file  . Alcohol Use: Yes     rare   Family History  Problem Relation Age of Onset  . Cancer Mother     cervical CA, kidney CA  . Hypertension Mother   . Diabetes Mother   . Cancer Sister     colon polyps ? CA  . Kidney disease Brother     kidney failure  . Cancer Maternal Aunt     breast CA   Allergies  Allergen Reactions  . Clindamycin  REACTION: hives, angioedema  . Penicillins     REACTION: hives, angioedema   Current Outpatient Prescriptions on File Prior to Visit  Medication Sig Dispense Refill  . naproxen (NAPROSYN) 500 MG tablet Take 500 mg by mouth 2 (two) times daily. for 7 days then as needed.       . lansoprazole (PREVACID) 15 MG capsule OTC as directed      . omeprazole-sodium bicarbonate (ZEGERID) 40-1100 MG per capsule Take 1 capsule by mouth at bedtime.        . triamcinolone (KENALOG) 0.1 % paste Apply to mouth lesion small dab two times a day, small op       . valACYclovir (VALTREX) 1000 MG tablet 2 by mouth two times a day for one day (separate doses by 12 hours) as needed cold sore        Current Facility-Administered Medications on File Prior to Visit  Medication Dose Route Frequency Provider Last Rate Last Dose  . cyanocobalamin ((VITAMIN B-12)) injection 1,000 mcg  1,000 mcg Intramuscular Q30 days Roxy Manns, MD   1,000 mcg at 05/22/10 1610        Review of Systems Review of Systems  Constitutional: Negative for fever, appetite change, pos for fatigue and  idfficulty loosing wt .  Eyes: Negative for pain and visual disturbance.  Respiratory: Negative for cough and shortness of breath.   Cardiovascular: Negative for cp or palpitations    Gastrointestinal: Negative for nausea, diarrhea and constipation.  Genitourinary: Negative for urgency and frequency.  Skin: Negative for pallor or rash   MSK pos for leg pain and restless feeling  Neurological: Negative for weakness, light-headedness, numbness and headaches.  Hematological: Negative for adenopathy. Does not bruise/bleed easily.  Psychiatric/Behavioral: Negative for dysphoric mood. The patient is not nervous/anxious.          Objective:   Physical Exam  Constitutional: She appears well-developed and well-nourished. No distress.       overwt and well appearing   HENT:  Head: Normocephalic and atraumatic.  Right Ear: External ear normal.  Left Ear: External ear normal.  Nose: Nose normal.  Mouth/Throat: Oropharynx is clear and moist.  Eyes: Conjunctivae and EOM are normal. Pupils are equal, round, and reactive to light. No scleral icterus.  Neck: Normal range of motion. Neck supple. No JVD present. Carotid bruit is not present. No thyromegaly present.  Cardiovascular: Normal rate, regular rhythm and intact distal pulses.  Exam reveals no gallop.   Pulmonary/Chest: Effort normal and breath sounds normal. No respiratory distress. She has no wheezes.  Abdominal: Soft. Bowel sounds are normal. She exhibits no distension and no mass. There is no tenderness.  Musculoskeletal: Normal range of motion. She exhibits no edema and no tenderness.  Lymphadenopathy:    She has no cervical adenopathy.  Neurological: She is alert. No cranial nerve deficit. She exhibits normal muscle tone. Coordination normal.  Skin: Skin is warm and dry. No rash noted. No erythema. No pallor.  Psychiatric: She has a normal mood and affect.       Nl affect, though seems fatigued           Assessment & Plan:

## 2010-11-09 NOTE — Assessment & Plan Note (Signed)
Despite very good effort - pt unable to loose wt - even with 1200 cal a day and rigerous exercise  Will ref to nutritionist for help with this

## 2010-11-09 NOTE — Assessment & Plan Note (Signed)
Reviewed health habits including diet and exercise and skin cancer prevention Also reviewed health mt list, fam hx and immunizations   Wellness labs reviewed Flu shot today

## 2010-11-09 NOTE — Assessment & Plan Note (Signed)
This is quite a bit worse - no imp with oral suppl and pt did not f/u for B12 shots due to distance Will give a shot today and see if she can get them at our [place] office - close to where she works  Re check level in 2 mo- with monthly shots

## 2010-12-10 ENCOUNTER — Ambulatory Visit (INDEPENDENT_AMBULATORY_CARE_PROVIDER_SITE_OTHER): Payer: BC Managed Care – PPO | Admitting: Family Medicine

## 2010-12-10 DIAGNOSIS — E538 Deficiency of other specified B group vitamins: Secondary | ICD-10-CM

## 2010-12-10 MED ORDER — CYANOCOBALAMIN 1000 MCG/ML IJ SOLN
1000.0000 ug | Freq: Once | INTRAMUSCULAR | Status: AC
  Administered 2010-12-10: 1000 ug via INTRAMUSCULAR

## 2010-12-10 NOTE — Progress Notes (Signed)
  Subjective:    Patient ID: [name], female    DOB: Jun 08, 1980, 30 y.o.   MRN: [id number]  HPI .   Review of Systems     Objective:   Physical Exam        Assessment & Plan:

## 2010-12-16 ENCOUNTER — Telehealth: Payer: Self-pay | Admitting: Family Medicine

## 2010-12-16 NOTE — Telephone Encounter (Signed)
Experiencing bad leg pains in upper thighs for past 2 weeks.  Had blood work and started on B12 recently; also started taking Topomax for migraines again but does not think it is related.  Pt requests a call back at [phone].

## 2010-12-22 ENCOUNTER — Telehealth: Payer: Self-pay | Admitting: *Deleted

## 2010-12-22 NOTE — Telephone Encounter (Signed)
Please schedule f/u with me when able, thanks

## 2010-12-22 NOTE — Telephone Encounter (Signed)
Pt is continuing to have leg pain, she says these are getting worse.  She has been having her B12 injections and is due for another one on Friday.  She's also been taking iron.  She's taking ibuprofen for the pain.  Asks what she should do next, also asking if she should be checked for ciliac disease.

## 2010-12-22 NOTE — Telephone Encounter (Signed)
Patient notified as instructed by telephone. Pt scheduled appt with Dr Milinda Antis 12/28/10 and pt will call back if need to be seen sooner,

## 2010-12-24 ENCOUNTER — Encounter: Payer: Self-pay | Admitting: *Deleted

## 2010-12-24 ENCOUNTER — Encounter: Payer: Self-pay | Admitting: Family Medicine

## 2010-12-24 ENCOUNTER — Ambulatory Visit (INDEPENDENT_AMBULATORY_CARE_PROVIDER_SITE_OTHER): Payer: BC Managed Care – PPO | Admitting: Family Medicine

## 2010-12-24 ENCOUNTER — Other Ambulatory Visit: Payer: Self-pay | Admitting: Family Medicine

## 2010-12-24 VITALS — BP 116/72 | HR 80 | Temp 98.0°F | Wt 157.0 lb

## 2010-12-24 DIAGNOSIS — M79605 Pain in left leg: Secondary | ICD-10-CM

## 2010-12-24 DIAGNOSIS — M79609 Pain in unspecified limb: Secondary | ICD-10-CM

## 2010-12-24 MED ORDER — NAPROXEN 500 MG PO TABS: ORAL_TABLET | ORAL | Status: DC

## 2010-12-24 NOTE — Assessment & Plan Note (Addendum)
Leg pain L>R.  Recent blood work from last month reviewed. Check Mg and CPK as well as D dimer to help r/o DVT Anticipate baker's cyst, possible rupture - check [place] to eval for this. Alternatively L sacroiliitis - provided with stretching exercises for SIJ dysfunction from [facility] pt advisor. Naprosyn 500mg  bid for pain relief. Update [place] if not improving as expected.

## 2010-12-24 NOTE — Patient Instructions (Signed)
This could be bakers cyst - pass by [facility] to schedule ultrasound. Alternatively I think you do have some inflammation of your sacroiliac joint on the left.  Stretching exercises provided. Treat both with Naprosyn 500mg  twice daily for 5 days with food then as needed. Blood work today to check on other things this could be.  We will call you with results.

## 2010-12-24 NOTE — Progress Notes (Signed)
  Subjective:    Patient ID: [name], female    DOB: 10-Jan-1981, 30 y.o.   MRN: [id number]  HPI CC: leg pain  Several month history of leg pains, last 4 wks getting worse.  Worse at night.  Pain described as constant hurting, heavy pain like leg in a knot posterior, sometimes sharp stabbing pain shooting down leg associated with this.  L>R pain.  Sometimes having lower lumbar back pain as well.  Wakes up with pain.  Has tried heat, walking, standing, sitting.  Ibuprofen/tylenol doesn't help.  H/o endometriosis. No tingling or numbness BLE.  No fevers/chills.  Recent TSH, K, and Cr, LFTs normal 10/2010. Has been receiving B12 shots for last 2 months, found low.  Advised to start iron as well, taking one a day with multivitamin.  No recent immobility.  Not on hormonal meds.  No recent smoking.  Mother and grandmother with blood clots in past.  Review of Systems Per HPI    Objective:   Physical Exam  Nursing note and vitals reviewed. Constitutional: She appears well-developed and well-nourished. No distress.  HENT:  Head: Normocephalic.  Musculoskeletal: She exhibits no edema.       No midline spine tenderness or paraspinous mm tenderness. + L SIJ tenderness.  Unable to complete FABER on left 2/2 tightness posterior leg Neg SLR bilaterally. No pain at sciatic notch Bilateral calves measure 38cm circumference. No palp cords L leg tender to palpation posterior distal thigh and popliteal region with slight swelling compared to R side.  Neurological: She is alert.  Skin: Skin is warm and dry. No rash noted.  Psychiatric: She has a normal mood and affect.       Assessment & Plan:

## 2010-12-25 ENCOUNTER — Ambulatory Visit: Payer: Self-pay | Admitting: Family Medicine

## 2010-12-25 ENCOUNTER — Ambulatory Visit: Payer: BC Managed Care – PPO

## 2010-12-25 ENCOUNTER — Ambulatory Visit
Admission: RE | Admit: 2010-12-25 | Discharge: 2010-12-25 | Disposition: A | Discharge status: Home / Self Care | Payer: BC Managed Care – PPO | Source: Ambulatory Visit | Attending: Family Medicine | Admitting: Family Medicine

## 2010-12-25 DIAGNOSIS — M79605 Pain in left leg: Secondary | ICD-10-CM

## 2010-12-28 ENCOUNTER — Ambulatory Visit: Payer: BC Managed Care – PPO | Admitting: Family Medicine

## 2010-12-28 NOTE — ED Provider Notes (Signed)
Order(s) created erroneously. Erroneous order ID: [id number] Order moved by: Felicie Kocher, DAWN Order move date/time: 12/28/2010 11:04 AM Source Patient:    [id number] Source Contact: 12/25/2010 Destination Patient:   [id number] Destination Contact: 09/02/2010

## 2011-01-06 ENCOUNTER — Ambulatory Visit: Payer: BC Managed Care – PPO

## 2011-01-11 ENCOUNTER — Ambulatory Visit (INDEPENDENT_AMBULATORY_CARE_PROVIDER_SITE_OTHER): Payer: BC Managed Care – PPO

## 2011-01-11 DIAGNOSIS — D518 Other vitamin B12 deficiency anemias: Secondary | ICD-10-CM

## 2011-01-11 MED ORDER — CYANOCOBALAMIN 1000 MCG/ML IJ SOLN: 1000.0000 ug | Freq: Once | INTRAMUSCULAR | Status: DC

## 2011-01-21 ENCOUNTER — Other Ambulatory Visit: Payer: BC Managed Care – PPO

## 2011-01-22 ENCOUNTER — Encounter: Payer: Self-pay | Admitting: Family Medicine

## 2011-01-22 ENCOUNTER — Telehealth: Payer: Self-pay | Admitting: Gastroenterology

## 2011-01-22 ENCOUNTER — Ambulatory Visit (INDEPENDENT_AMBULATORY_CARE_PROVIDER_SITE_OTHER): Payer: BC Managed Care – PPO | Admitting: Family Medicine

## 2011-01-22 VITALS — BP 110/68 | HR 72 | Temp 98.3°F | Ht 63.0 in | Wt 154.2 lb

## 2011-01-22 DIAGNOSIS — K900 Celiac disease: Secondary | ICD-10-CM

## 2011-01-22 DIAGNOSIS — K9041 Non-celiac gluten sensitivity: Secondary | ICD-10-CM | POA: Insufficient documentation

## 2011-01-22 DIAGNOSIS — R1013 Epigastric pain: Secondary | ICD-10-CM

## 2011-01-22 DIAGNOSIS — E538 Deficiency of other specified B group vitamins: Secondary | ICD-10-CM

## 2011-01-22 DIAGNOSIS — K3189 Other diseases of stomach and duodenum: Secondary | ICD-10-CM

## 2011-01-22 NOTE — Progress Notes (Signed)
Subjective:    Patient ID: [name], female    DOB: 05-16-1980, 30 y.o.   MRN: [id number]  HPI Here for GI issues and leg cramps   GI issues have been going on for a while  Ever since she had ccy - occ urgent stools - knows this is common Now if she eats cakes/ bread/ rice -- stomach bloats/ constipation/ diarrhea , and occ vomiting  Diarrhea -- after any starches - bad cramps , no blood in stool  Has had to basically go gluten free No one in family has celiac dz   (? But gmother paternal had some sort of issue)  She takes prevacid  Wt is down 3 lb  Had colonoscopy a while ago- is due in April  Also has B12 issues  Has had 3 shots so far and energy level is improved  Needs level today   Feels better since she stopped eating wheat products   Also having some leg and arm pain  She has suddenly become overly sensitive to touch -- all over  Also exhausted and easy muscle fatigue- esp after working and shopping  Patient Active Problem List  Diagnoses  . CAVERNOUS HEMANGIOMA, LIVER  . VITAMIN B12 DEFICIENCY  . SMOKER  . ALLERGIC RHINITIS  . GERD  . DYSPEPSIA  . ENDOMETRIOSIS  . DIARRHEA  . URINARY FREQUENCY, CHRONIC  . PERSONAL HISTORY OF COLONIC POLYPS  . RENAL CALCULUS, HX OF  . MIGRAINES, HX OF  . APHTHOUS ULCERS  . FIBROCYSTIC BREAST DISEASE  . Hypokalemia  . Fatigue  . Amenorrhea  . Routine general medical examination at a health care facility  . Obesity  . Anemia  . Left leg pain  . Gluten intolerance   Past Medical History  Diagnosis Date  . GERD (gastroesophageal reflux disease)   . Endometriosis   . Fatigue a  . Kidney stone    Past Surgical History  Procedure Date  . Cholecystectomy   . Ovarian cyst surgery   . Laparoscopy 11/2004    endometriosis  . Breast surgery 2004    breast reduction   History  Substance Use Topics  . Smoking status: Former Smoker -- 0.2 packs/day    Quit date: 01/25/2005  . Smokeless tobacco: Not on file  .  Alcohol Use: Yes     rare   Family History  Problem Relation Age of Onset  . Cancer Mother     cervical CA, kidney CA  . Hypertension Mother   . Diabetes Mother   . Cancer Sister     colon polyps ? CA  . Kidney disease Brother     kidney failure  . Cancer Maternal Aunt     breast CA   Allergies  Allergen Reactions  . Clindamycin     REACTION: hives, angioedema  . Penicillins     REACTION: hives, angioedema   Current Outpatient Prescriptions on File Prior to Visit  Medication Sig Dispense Refill  . lansoprazole (PREVACID) 15 MG capsule OTC as directed      . Multiple Vitamin (MULTIVITAMIN) tablet Take 1 tablet by mouth daily.        Marland Kitchen topiramate (TOPAMAX) 25 MG tablet Take 25 mg by mouth 2 (two) times daily.        . naproxen (NAPROSYN) 500 MG tablet Take one po bid x 1 week then prn pain, take with food  60 tablet  0  . triamcinolone (KENALOG) 0.1 % paste Apply to mouth lesion  small dab two times a day, small op       . valACYclovir (VALTREX) 1000 MG tablet 2 by mouth two times a day for one day (separate doses by 12 hours) as needed cold sore        Current Facility-Administered Medications on File Prior to Visit  Medication Dose Route Frequency Provider Last Rate Last Dose  . cyanocobalamin ((VITAMIN B-12)) injection 1,000 mcg  1,000 mcg Intramuscular Q30 days Roxy Manns, MD   1,000 mcg at 01/11/11 1742     Review of Systems Review of Systems  Constitutional: Negative for fever, appetite change, and unexpected weight change. pos for fatigue Eyes: Negative for pain and visual disturbance.  Respiratory: Negative for cough and shortness of breath.   Cardiovascular: Negative for cp or palpitations    Gastrointestinal: positive for abd pain/ bloating / diarrhea and vomiting / neg for blood in stool Genitourinary: Negative for urgency and frequency.  Skin: Negative for pallor or rash   MSK pos for sensitvity of muscles in arms and legs/ neg for cramps  Neurological:  Negative for weakness, light-headedness, numbness and headaches.  Hematological: Negative for adenopathy. Does not bruise/bleed easily.  Psychiatric/Behavioral: Negative for dysphoric mood. The patient is not nervous/anxious.          Objective:   Physical Exam  Constitutional: She appears well-developed and well-nourished. No distress.  HENT:  Head: Normocephalic and atraumatic.  Mouth/Throat: Oropharynx is clear and moist.  Eyes: Conjunctivae and EOM are normal. Pupils are equal, round, and reactive to light. No scleral icterus.  Neck: Normal range of motion. Neck supple. No JVD present. No thyromegaly present.  Cardiovascular: Normal rate, regular rhythm, normal heart sounds and intact distal pulses.   Pulmonary/Chest: Effort normal and breath sounds normal. No respiratory distress. She has no wheezes.  Abdominal: Soft. Bowel sounds are normal. She exhibits no distension and no mass. There is no hepatosplenomegaly. There is tenderness in the right lower quadrant and left lower quadrant. There is no rebound, no guarding, no CVA tenderness and no tenderness at McBurney's point.  Musculoskeletal: Normal range of motion. She exhibits no edema and no tenderness.       Generally sensitive to touch on extremeties and back - similar to myofascial trigger points   Lymphadenopathy:    She has no cervical adenopathy.  Neurological: She is alert. She has normal reflexes. She exhibits normal muscle tone.  Skin: Skin is warm and dry. No rash noted. No erythema. No pallor.  Psychiatric: She has a normal mood and affect.          Assessment & Plan:

## 2011-01-22 NOTE — Patient Instructions (Addendum)
B12 level today -- then will make a plan  We will do referral to GI at check out  Labs today  Update me if any new symptoms like fever

## 2011-01-22 NOTE — Assessment & Plan Note (Signed)
Now has had 3 B65monthly shots- level today  ? If rel to GI symptoms or PPI Will advise

## 2011-01-22 NOTE — Assessment & Plan Note (Signed)
Worse bloating and symptoms with wheat - ? If poss celiac Ref to GI for further eval Pt will remain on gluten free diet until then

## 2011-01-22 NOTE — Assessment & Plan Note (Signed)
abd pain / bloating/ diarrhea with wheat products- better with gluten free diet Also B12 def not helped by oral suppl Ref to GI (also due colonosc in 2013)

## 2011-01-25 NOTE — Telephone Encounter (Signed)
Spoke with Shirlee Limerick and scheduled patient on 02/01/11 at 9:00 AM with Mike Gip, PA.

## 2011-02-01 ENCOUNTER — Ambulatory Visit (INDEPENDENT_AMBULATORY_CARE_PROVIDER_SITE_OTHER): Payer: BC Managed Care – PPO | Admitting: Physician Assistant

## 2011-02-01 ENCOUNTER — Encounter: Payer: Self-pay | Admitting: Physician Assistant

## 2011-02-01 ENCOUNTER — Other Ambulatory Visit (INDEPENDENT_AMBULATORY_CARE_PROVIDER_SITE_OTHER): Payer: BC Managed Care – PPO

## 2011-02-01 VITALS — BP 114/64 | HR 60 | Ht 64.0 in | Wt 157.6 lb

## 2011-02-01 DIAGNOSIS — R141 Gas pain: Secondary | ICD-10-CM

## 2011-02-01 DIAGNOSIS — R140 Abdominal distension (gaseous): Secondary | ICD-10-CM

## 2011-02-01 DIAGNOSIS — Z860100 Personal history of colon polyps, unspecified: Secondary | ICD-10-CM

## 2011-02-01 DIAGNOSIS — R634 Abnormal weight loss: Secondary | ICD-10-CM

## 2011-02-01 DIAGNOSIS — Z800 Family history of malignant neoplasm of digestive organs: Secondary | ICD-10-CM

## 2011-02-01 DIAGNOSIS — R143 Flatulence: Secondary | ICD-10-CM

## 2011-02-01 DIAGNOSIS — R110 Nausea: Secondary | ICD-10-CM

## 2011-02-01 DIAGNOSIS — R142 Eructation: Secondary | ICD-10-CM

## 2011-02-01 DIAGNOSIS — R197 Diarrhea, unspecified: Secondary | ICD-10-CM

## 2011-02-01 LAB — CBC WITH DIFFERENTIAL/PLATELET
Eosinophils Relative: 1.1 % (ref 0.0–5.0)
Lymphocytes Relative: 23.6 % (ref 12.0–46.0)
MCV: 88.8 fl (ref 78.0–100.0)
Monocytes Absolute: 0.4 10*3/uL (ref 0.1–1.0)
Monocytes Relative: 8 % (ref 3.0–12.0)
Neutrophils Relative %: 65.9 % (ref 43.0–77.0)
Platelets: 214 10*3/uL (ref 150.0–400.0)
RBC: 4.11 Mil/uL (ref 3.87–5.11)
WBC: 5 10*3/uL (ref 4.5–10.5)

## 2011-02-01 LAB — IBC PANEL
Iron: 80 ug/dL (ref 42–145)
Saturation Ratios: 22.9 % (ref 20.0–50.0)
Transferrin: 249.9 mg/dL (ref 212.0–360.0)

## 2011-02-01 LAB — IRON: Iron: 80 ug/dL (ref 42–145)

## 2011-02-01 MED ORDER — PEG-KCL-NACL-NASULF-NA ASC-C 100 G PO SOLR: ORAL | Status: DC

## 2011-02-01 NOTE — Patient Instructions (Signed)
Please go to the basement level to have your labs drawn. We scheduled the Endoscopy and Colonoscopy with Dr Arlyce Dice on 02-22-2011.   We have given you a prescription for the colonoscopy prep for you to take to your pharmacy along with a $20.00 coupon rebate.  Directions  Provided.

## 2011-02-02 ENCOUNTER — Telehealth: Payer: Self-pay | Admitting: *Deleted

## 2011-02-02 ENCOUNTER — Encounter: Payer: Self-pay | Admitting: Physician Assistant

## 2011-02-02 LAB — GLIA (IGA/G) + TTG IGA
Gliadin IgA: 3.7 U/mL (ref <20)
Gliadin IgG: 4.4 U/mL (ref <20)
Tissue Transglutaminase Ab, IgA: 5.9 U/mL (ref <20)

## 2011-02-02 NOTE — Telephone Encounter (Signed)
Patient notified of results as per Amy Esterwood, PA 

## 2011-02-02 NOTE — Telephone Encounter (Signed)
Message copied by Daphine Deutscher on Tue Feb 02, 2011  2:29 PM ------      Message from: Olivarez, Virginia S      Created: Tue Feb 02, 2011  2:20 PM       Please let Abigayle know her labs look good, no iron deficiency, celiac markers are normal

## 2011-02-02 NOTE — Progress Notes (Signed)
Subjective:    Patient ID: [name], female    DOB: April 22, 1980, 31 y.o.   MRN: [id number]  HPI Alexandrina is a pleasant 31 year old white female known to Dr. Arlyce Dice who has history of colon polyps, endometriosis, aphthous ulcerations and she is status post cholecystectomy. Family history is pertinent for paternal grandfather deceased secondary colon cancer. Patient also has a chronic B12 deficiency and is on maintenance therapy through her primary care.  She comes in today because of concerns for gluten intolerance. She says she's been having much more difficulty with stomach issues over the past year and has decided that there are certain foods that definitely make her sick every time she eats them- these include pasta, breads, cake and  pizza. She has tried to eliminate most of these items and says she feels much better if she doesn't eat anything containing gluten. If she does, she gets postprandial abdominal pain bloating gas and diarrhea. She also notes that wine and beer cause similar symptoms. She has also had very frequent headaches and feels that the headaches have been associated with gluten consumption as well.  As above she does have history of B12 deficiency says she has been anemic but is uncertain whether or not she's been iron deficient. She had a colonoscopy done in 2008 at [facility] and did have one polyp removed, I do not have the path available from that report. She had upper endoscopy here  in 2011 with findings of a  distal esophageal stricture which was [facility] dilated.    Review of Systems  Constitutional: Positive for fatigue and unexpected weight change.  HENT: Negative.   Eyes: Negative.   Respiratory: Negative.   Gastrointestinal: Positive for nausea, abdominal pain and diarrhea.  Genitourinary: Negative.   Musculoskeletal: Negative.   Neurological: Negative.   Hematological: Negative.   Psychiatric/Behavioral: Negative.    Outpatient Prescriptions  Prior to Visit  Medication Sig Dispense Refill  . lansoprazole (PREVACID) 15 MG capsule OTC as directed      . Multiple Vitamin (MULTIVITAMIN) tablet Take 1 tablet by mouth daily.        Marland Kitchen topiramate (TOPAMAX) 25 MG tablet Take 25 mg by mouth 2 (two) times daily.        . naproxen (NAPROSYN) 500 MG tablet Take one po bid x 1 week then prn pain, take with food  60 tablet  0  . triamcinolone (KENALOG) 0.1 % paste Apply to mouth lesion small dab two times a day, small op       . valACYclovir (VALTREX) 1000 MG tablet 2 by mouth two times a day for one day (separate doses by 12 hours) as needed cold sore        Facility-Administered Medications Prior to Visit  Medication Dose Route Frequency Provider Last Rate Last Dose  . cyanocobalamin ((VITAMIN B-12)) injection 1,000 mcg  1,000 mcg Intramuscular Q30 days Roxy Manns, MD   1,000 mcg at 01/11/11 1742   Allergies  Allergen Reactions  . Clindamycin     REACTION: hives, angioedema  . Penicillins     REACTION: hives, angioedema       Objective:   Physical Exam well-developed young white female in no acute distress, pleasant, HEENT; nontraumatic, normocephalic, EOMI, PERRLA sclera anicteric, Neck; Supple no JVD, Cardiovascular; regular rate and rhythm with S1-S2 no murmur gallop, Pulmonary; clear bilaterally, Abdomen; soft basically nontender no palpable mass or hepatosplenomegaly no guarding no rebound bowel sounds are active, Rectal; not done, Extremities; no clubbing  cyanosis or edema skin warm and dry, Psych; mood and affect normal and appropriate        Assessment & Plan:  #48 31 year old female with chronic complaints of postprandial abdominal pain bloating and diarrhea which patient feels occur in conjunction with gluten consumption. Rule out gluten sensitivity versus celiac disease. #2 B12 deficiency #3 anemia rule out iron deficiency #4 history of colon polyp on colonoscopy 2008, type unknown, patient was told to have followup  colonoscopy in 5 years #5 chronic GERD with history of distal esophageal stricture #6 status post cholecystectomy  Plan; Continue Prevacid 15 mg twice daily Check celiac panel, CBC and iron studies today Schedule for upper endoscopy with small bowel biopsies, patient is also due for followup colonoscopy and this will be scheduled as well with Dr. Arlyce Dice. Patient is advised that she will likely need to continue to follow a gluten-free diet regardless of lab findings, she was asked not to be strict prior to the small bowel biopsies.

## 2011-02-02 NOTE — Progress Notes (Signed)
Agree with initial assessment and plans. NOTE: If patient has been on gluten free diet (and actually has celiac disease) the bx and serologies may be normal.

## 2011-02-05 ENCOUNTER — Telehealth: Payer: Self-pay | Admitting: Gastroenterology

## 2011-02-05 NOTE — Telephone Encounter (Signed)
Pt will pick up MoviPrep sample on Monday

## 2011-02-09 ENCOUNTER — Ambulatory Visit (AMBULATORY_SURGERY_CENTER): Payer: BC Managed Care – PPO | Admitting: Gastroenterology

## 2011-02-09 ENCOUNTER — Encounter: Payer: Self-pay | Admitting: Gastroenterology

## 2011-02-09 DIAGNOSIS — R634 Abnormal weight loss: Secondary | ICD-10-CM

## 2011-02-09 DIAGNOSIS — Z800 Family history of malignant neoplasm of digestive organs: Secondary | ICD-10-CM

## 2011-02-09 DIAGNOSIS — D126 Benign neoplasm of colon, unspecified: Secondary | ICD-10-CM

## 2011-02-09 DIAGNOSIS — R197 Diarrhea, unspecified: Secondary | ICD-10-CM

## 2011-02-09 DIAGNOSIS — R110 Nausea: Secondary | ICD-10-CM

## 2011-02-09 DIAGNOSIS — Z86010 Personal history of colonic polyps: Secondary | ICD-10-CM

## 2011-02-09 MED ORDER — SODIUM CHLORIDE 0.9 % IV SOLN: 500.0000 mL | INTRAVENOUS | Status: DC

## 2011-02-09 NOTE — Op Note (Signed)
[facility] [address] [place], [place]  [address]  ENDOSCOPY PROCEDURE REPORT  PATIENT:  [name], [name]  MR#:  [id number] BIRTHDATE:  1980/10/22, 30 yrs. old  GENDER:  female  ENDOSCOPIST:  Barbette Hair. Arlyce Dice, MD Referred by:  PROCEDURE DATE:  02/09/2011 PROCEDURE:  EGD with biopsy, 19147 ASA CLASS:  Class I INDICATIONS:  diarrhea r/o celiac disease  MEDICATIONS:   There was residual sedation effect present from prior procedure., MAC sedation, administered by CRNA propofol 260mg IV, glycopyrrolate (Robinal) 0.2 mg IV, 0.6cc simethancone 0.6 cc PO TOPICAL ANESTHETIC:  DESCRIPTION OF PROCEDURE:   After the risks and benefits of the procedure were explained, informed consent was obtained.  [facility] [id number] endoscope was introduced through the mouth and advanced to the third portion of the duodenum.  The instrument was slowly withdrawn as the mucosa was fully examined. <<PROCEDUREIMAGES>>  The upper, middle, and distal third of the esophagus were carefully inspected and no abnormalities were noted. The z-line was well seen at the GEJ. The endoscope was pushed into the fundus which was normal including a retroflexed view. The antrum,gastric body, first and second part of the duodenum were unremarkable (see image1, image2, and image3). Small bowel biopsies were taken to r/o celiac disease    Retroflexed views revealed no abnormalities. The scope was then withdrawn from the patient and the procedure completed.  COMPLICATIONS:  None  ENDOSCOPIC IMPRESSION: 1) Normal EGD RECOMMENDATIONS: 1) Await biopsy results  ______________________________ Barbette Hair. Arlyce Dice, MD  CC:  Judy Pimple, MD  n. Rosalie DoctorBarbette Hair. Tevita Gomer at 02/09/2011 02:29 PM  Steve Rattler, [phone]

## 2011-02-09 NOTE — Patient Instructions (Addendum)
Office visit 2-3 weeks FOLLOW DISCHARGE INSTRUCTIONS (BLUE AND GREEN SHEETS). Repeat colonoscopy in 10 years, 2023

## 2011-02-09 NOTE — Op Note (Signed)
[facility] [address] [place], [place]  [address]  COLONOSCOPY PROCEDURE REPORT  PATIENT:  Susan, Page  MR#:  [id number] BIRTHDATE:  [birth year], 30 yrs. old  GENDER:  female ENDOSCOPIST:  Barbette Hair. Arlyce Dice, MD REF. BY: PROCEDURE DATE:  02/09/2011 PROCEDURE:  Colonoscopy with biopsy ASA CLASS:  Class I INDICATIONS:  unexplained diarrhea, history of pre-cancerous (adenomatous) colon polyps MEDICATIONS:   MAC sedation, administered by CRNA propofol 340mg IV  DESCRIPTION OF PROCEDURE:   After the risks benefits and alternatives of the procedure were thoroughly explained, informed consent was obtained.  Digital rectal exam was performed and revealed no abnormalities.   The LB [id number] endoscope was introduced through the anus and advanced to the cecum, which was identified by both the appendix and ileocecal valve, without limitations.  The quality of the prep was good, using MoviPrep. The instrument was then slowly withdrawn as the colon was fully examined. <<PROCEDUREIMAGES>>  FINDINGS:  A normal appearing cecum, ileocecal valve, and appendiceal orifice were identified. The ascending, hepatic flexure, transverse, splenic flexure, descending, sigmoid colon, and rectum appeared unremarkable (see image2 and image3). Random biopsies were taken to r/o microscopic colitis   Retroflexed views in the rectum revealed no abnormalities.    The time to cecum = 1) 4.50  minutes. The scope was then withdrawn in  1) 6.50  minutes from the cecum and the procedure completed. COMPLICATIONS:  None ENDOSCOPIC IMPRESSION: 1) Normal colon RECOMMENDATIONS: 1) Await biopsy results REPEAT EXAM:  In 10 year(s) for Colonoscopy.  ______________________________ Barbette Hair. Arlyce Dice, MD  CC:  Judy Pimple, MD  n. Rosalie DoctorBarbette Hair. Kaplan at 02/09/2011 02:17 PM  Steve Rattler, [phone]

## 2011-02-09 NOTE — Progress Notes (Signed)
Propofol was administered by Iline Oven, CRNA. Maw  Hung the second bag of n.s. 0.9% at 14:08. Maw   The pt tolerated the colonoscopy very well. Maw  Pt clamped her teeth tight.  Difficulity incerting the bite block.  Peds bite block used. Maw  Pt tolerated the egd very well. Maw

## 2011-02-09 NOTE — Progress Notes (Signed)
Patient did not experience any of the following events: a burn prior to discharge; a fall within the facility; wrong site/side/patient/procedure/implant event; or a hospital transfer or hospital admission upon discharge from the facility. (G8907) Patient did not have preoperative order for IV antibiotic SSI prophylaxis. (G8918)  

## 2011-02-10 ENCOUNTER — Telehealth: Payer: Self-pay | Admitting: *Deleted

## 2011-02-10 NOTE — Telephone Encounter (Signed)
No answer, message left to call if questions or concerns. 

## 2011-02-16 ENCOUNTER — Encounter: Payer: Self-pay | Admitting: Gastroenterology

## 2011-02-18 ENCOUNTER — Encounter: Payer: Self-pay | Admitting: *Deleted

## 2011-02-19 ENCOUNTER — Ambulatory Visit: Payer: BC Managed Care – PPO | Admitting: Gastroenterology

## 2011-02-22 ENCOUNTER — Encounter: Payer: BC Managed Care – PPO | Admitting: Gastroenterology

## 2011-02-23 ENCOUNTER — Other Ambulatory Visit: Payer: Self-pay | Admitting: Family Medicine

## 2011-02-23 DIAGNOSIS — Z1231 Encounter for screening mammogram for malignant neoplasm of breast: Secondary | ICD-10-CM

## 2011-02-23 MED ORDER — TOPIRAMATE 25 MG PO TABS: 25.0000 mg | ORAL_TABLET | Freq: Two times a day (BID) | ORAL | Status: DC

## 2011-02-23 NOTE — Telephone Encounter (Signed)
Left vm for pt to callback 

## 2011-02-23 NOTE — Telephone Encounter (Signed)
This can be refilled - I have seen her within the year

## 2011-02-23 NOTE — Telephone Encounter (Signed)
Pt called, request [facility] [place]. Told pt she would need office visit, but I would send the request to the nurse to confirm.

## 2011-02-24 NOTE — Telephone Encounter (Signed)
Patient notified as instructed by telephone. 

## 2011-02-25 ENCOUNTER — Ambulatory Visit (INDEPENDENT_AMBULATORY_CARE_PROVIDER_SITE_OTHER): Payer: BC Managed Care – PPO | Admitting: *Deleted

## 2011-02-25 DIAGNOSIS — E539 Vitamin B deficiency, unspecified: Secondary | ICD-10-CM

## 2011-02-25 MED ORDER — CYANOCOBALAMIN 1000 MCG/ML IJ SOLN
1000.0000 ug | Freq: Once | INTRAMUSCULAR | Status: AC
  Administered 2011-02-25: 1000 ug via INTRAMUSCULAR

## 2011-02-26 ENCOUNTER — Ambulatory Visit (INDEPENDENT_AMBULATORY_CARE_PROVIDER_SITE_OTHER): Payer: BC Managed Care – PPO | Admitting: Gastroenterology

## 2011-02-26 VITALS — BP 90/50 | HR 76 | Ht 64.0 in | Wt 155.0 lb

## 2011-02-26 DIAGNOSIS — R197 Diarrhea, unspecified: Secondary | ICD-10-CM

## 2011-02-26 NOTE — Assessment & Plan Note (Signed)
While the patient doesn't have celiac disease, I suspect that she is not digesting gluten and probably mono and polysaccharides including lactose.  Recommendations #1low FODMAP diet; gluten-free diet

## 2011-02-26 NOTE — Progress Notes (Signed)
History of Present Illness:  [name] has returned following upper and lower endoscopy. Biopsies of normal-appearing duodenum and normal colonic mucosa were entirely negative. Serologies were negative for celiac disease. She continues to complain of abdominal bloating and discomfort when she eats certain foods that contain gluten and sugars.  She is lactose intolerant.    Review of Systems: Pertinent positive and negative review of systems were noted in the above HPI section. All other review of systems were otherwise negative.    Current Medications, Allergies, Past Medical History, Past Surgical History, Family History and Social History were reviewed in [facility] electronic medical record  Vital signs were reviewed in today's medical record. Physical Exam: General: Well developed , well nourished, no acute distress ogical:  Alert and cooperative. Normal mood and affect

## 2011-02-26 NOTE — Patient Instructions (Signed)
Follow-up in 6 weeks

## 2011-03-13 ENCOUNTER — Ambulatory Visit (INDEPENDENT_AMBULATORY_CARE_PROVIDER_SITE_OTHER): Payer: BC Managed Care – PPO | Admitting: Family Medicine

## 2011-03-13 ENCOUNTER — Encounter: Payer: Self-pay | Admitting: Family Medicine

## 2011-03-13 VITALS — BP 110/62 | Temp 98.3°F | Wt 155.0 lb

## 2011-03-13 DIAGNOSIS — J329 Chronic sinusitis, unspecified: Secondary | ICD-10-CM

## 2011-03-13 DIAGNOSIS — J029 Acute pharyngitis, unspecified: Secondary | ICD-10-CM

## 2011-03-13 MED ORDER — AZITHROMYCIN 250 MG PO TABS: ORAL_TABLET | ORAL | Status: AC

## 2011-03-13 MED ORDER — FLUTICASONE PROPIONATE 50 MCG/ACT NA SUSP: 2.0000 | Freq: Every day | NASAL | Status: DC

## 2011-03-13 NOTE — Patient Instructions (Signed)
Drink plenty of fluids, take tylenol as needed, and gargle with warm salt water for your throat.  Start the antibiotics, use the flonase and nasal saline.  This should gradually improve.  Take care.  Follow up with your regular doctor as needed.

## 2011-03-13 NOTE — Progress Notes (Signed)
duration of symptoms: ~10 days rhinorrhea:yes congestion:yes ear pain: yes, popping sore throat:yes Cough: minimal, mainly due to post nasal gtt Myalgias: intermittently.  other concerns: HA and facial pressure. Sleep is disrupted. Nosebleed, slight, intermittent over last week.    ROS: See HPI.  Otherwise negative.    Meds, vitals, and allergies reviewed.   GEN: nad, alert and oriented HEENT: mucous membranes moist, TM w/o erythema, nasal epithelium injected, OP with cobblestoning, max sinus ttp x2 NECK: supple w/o LA CV: rrr. PULM: ctab, no inc wob ABD: soft, +bs EXT: no edema  RST neg.

## 2011-03-13 NOTE — Assessment & Plan Note (Signed)
Nontoxic, start zmax, flonase and nasal saline given the duration of sx, f/u with PMD prn.  She agrees.

## 2011-03-19 ENCOUNTER — Encounter: Payer: Self-pay | Admitting: Family Medicine

## 2011-03-19 ENCOUNTER — Ambulatory Visit (INDEPENDENT_AMBULATORY_CARE_PROVIDER_SITE_OTHER): Payer: BC Managed Care – PPO | Admitting: Family Medicine

## 2011-03-19 VITALS — BP 100/62 | HR 80 | Temp 97.8°F | Ht 64.0 in | Wt 157.5 lb

## 2011-03-19 DIAGNOSIS — K900 Celiac disease: Secondary | ICD-10-CM

## 2011-03-19 DIAGNOSIS — R5383 Other fatigue: Secondary | ICD-10-CM

## 2011-03-19 DIAGNOSIS — E538 Deficiency of other specified B group vitamins: Secondary | ICD-10-CM

## 2011-03-19 DIAGNOSIS — R5381 Other malaise: Secondary | ICD-10-CM

## 2011-03-19 DIAGNOSIS — K9041 Non-celiac gluten sensitivity: Secondary | ICD-10-CM

## 2011-03-19 DIAGNOSIS — Z87898 Personal history of other specified conditions: Secondary | ICD-10-CM

## 2011-03-19 DIAGNOSIS — IMO0001 Reserved for inherently not codable concepts without codable children: Secondary | ICD-10-CM

## 2011-03-19 DIAGNOSIS — M7918 Myalgia, other site: Secondary | ICD-10-CM

## 2011-03-19 MED ORDER — CYANOCOBALAMIN 1000 MCG/ML IJ SOLN
1000.0000 ug | Freq: Once | INTRAMUSCULAR | Status: AC
  Administered 2011-03-19: 1000 ug via INTRAMUSCULAR

## 2011-03-19 MED ORDER — GABAPENTIN 100 MG PO CAPS: 100.0000 mg | ORAL_CAPSULE | Freq: Three times a day (TID) | ORAL | Status: DC

## 2011-03-19 NOTE — Assessment & Plan Note (Signed)
Not celiac or allergy Disc recent w/u Will continue gluten free diet since it helps

## 2011-03-19 NOTE — Assessment & Plan Note (Signed)
Multifactorial- commended for exercise Rev recent lab and GI w/u Will continue gluten free diet and B12 suppl  Also trial of gabapentin for pain / poss fibromyalgia

## 2011-03-19 NOTE — Progress Notes (Signed)
Subjective:    Patient ID: [name], female    DOB: May 01, 1980, 31 y.o.   MRN: [id number]  HPI Here for multiple symptoms   Since last visit - colonosc and bx --no celiac dz Dr Arlyce Dice told her she is gluten intolerant- but no all or autoimmune problem She also avoids mild  Does drink soy milk   Was here recently - sinusitis- after 2 week --tx her with zpak -- and is feeling better from that   Symptoms right now include fatigue, widespread pain, numbness and tingling in hands and feet  (recent labs incl sugar all ok )  Still has brain fog-- ? From topamax  Migraines are back more recently -- last week she had 4  Also bruising  Is doing yoga 3 weeks - she likes it , also some zumba That is helping her sleep better   Not stressed or depressed or anxious   No fever or joint swelling or rash   Her pain seems soft tissue and muscular   Patient Active Problem List  Diagnoses  . CAVERNOUS HEMANGIOMA, LIVER  . VITAMIN B12 DEFICIENCY  . SMOKER  . ALLERGIC RHINITIS  . GERD  . DYSPEPSIA  . ENDOMETRIOSIS  . Diarrhea  . URINARY FREQUENCY, CHRONIC  . PERSONAL HISTORY OF COLONIC POLYPS  . RENAL CALCULUS, HX OF  . MIGRAINES, HX OF  . APHTHOUS ULCERS  . FIBROCYSTIC BREAST DISEASE  . Hypokalemia  . Fatigue  . Amenorrhea  . Routine general medical examination at a health care facility  . Obesity  . Anemia  . Left leg pain  . Gluten intolerance  . Sinusitis  . Myofascial pain   Past Medical History  Diagnosis Date  . GERD (gastroesophageal reflux disease)   . Endometriosis   . Fatigue   . Kidney stone   . Vitamin B12 deficiency   . Migraine   . Personal history of colonic polyps 06/01/2006    sessil serrated adenoma  . Family history of malignant neoplasm of gastrointestinal tract    Past Surgical History  Procedure Date  . Cholecystectomy   . Ovarian cyst surgery   . Laparoscopy 11/2004    endometriosis  . Breast surgery 2004    breast reduction  . Tubal  ligation 2008  . Wisdom tooth extraction     with four other teeth as well   History  Substance Use Topics  . Smoking status: Former Smoker -- 0.2 packs/day    Quit date: 01/25/2005  . Smokeless tobacco: Not on file  . Alcohol Use: No     rare   Family History  Problem Relation Age of Onset  . Cervical cancer Mother   . Hypertension Mother   . Diabetes Mother   . Kidney cancer Mother   . Colon polyps Sister   . Kidney failure Brother   . Breast cancer Maternal Aunt   . Breast cancer Maternal Grandmother   . Colon cancer Paternal Grandfather   . Wilson's disease Father     liver transplant  . Ulcerative colitis Father   . Esophageal cancer Neg Hx   . Stomach cancer Neg Hx   . Rectal cancer Neg Hx    Allergies  Allergen Reactions  . Clindamycin     REACTION: hives, angioedema  . Penicillins     REACTION: hives, angioedema   Current Outpatient Prescriptions on File Prior to Visit  Medication Sig Dispense Refill  . cyanocobalamin (,VITAMIN B-12,) 1000 MCG/ML injection Inject 1,000  mcg into the muscle every 30 (thirty) days.      . fluticasone (FLONASE) 50 MCG/ACT nasal spray Place 2 sprays into the nose daily.  16 g  0  . lansoprazole (PREVACID) 15 MG capsule OTC as directed      . Multiple Vitamin (MULTIVITAMIN) tablet Take 1 tablet by mouth daily.        Marland Kitchen topiramate (TOPAMAX) 25 MG tablet Take 1 tablet (25 mg total) by mouth 2 (two) times daily.  60 tablet  11   Current Facility-Administered Medications on File Prior to Visit  Medication Dose Route Frequency Provider Last Rate Last Dose  . cyanocobalamin ((VITAMIN B-12)) injection 1,000 mcg  1,000 mcg Intramuscular Q30 days Roxy Manns, MD   1,000 mcg at 01/11/11 1742      Review of Systems Review of Systems  Constitutional: Negative for fever, appetite change, and unexpected weight change. pos for fatigue Eyes: Negative for pain and visual disturbance.  Respiratory: Negative for cough and shortness of breath.     Cardiovascular: Negative for cp or palpitations    Gastrointestinal: Negative for nausea, diarrhea and constipation. (unless she eats gluten) Genitourinary: Negative for urgency and frequency. neg for dysuria MSK pos for muscle/ myofascial pain , neg for joint pain or swelling Skin: Negative for pallor or rash   Neurological: Negative for weakness, light-headedness, numbness and headaches.  Hematological: Negative for adenopathy. Does  bruise/bleed easily.  Psychiatric/Behavioral: Negative for dysphoric mood. The patient is not nervous/anxious.          Objective:   Physical Exam  Constitutional: She appears well-developed and well-nourished. No distress.  HENT:  Head: Normocephalic and atraumatic.  Right Ear: External ear normal.  Mouth/Throat: Oropharynx is clear and moist.  Eyes: Conjunctivae and EOM are normal. Pupils are equal, round, and reactive to light. Right eye exhibits no discharge. Left eye exhibits no discharge. No scleral icterus.  Neck: Normal range of motion. Neck supple. No JVD present. Carotid bruit is not present. No thyromegaly present.  Cardiovascular: Normal rate, regular rhythm, normal heart sounds and intact distal pulses.  Exam reveals no gallop.   Pulmonary/Chest: Effort normal and breath sounds normal. No respiratory distress. She has no wheezes. She exhibits no tenderness.  Abdominal: Soft. Bowel sounds are normal. She exhibits no distension, no abdominal bruit and no mass. There is no tenderness.  Musculoskeletal: Normal range of motion. She exhibits tenderness. She exhibits no edema.       Pos for all fibromyalgia trigger points--TTP No joint swelling or redness  Lymphadenopathy:    She has no cervical adenopathy.  Neurological: She is alert. She has normal reflexes. No cranial nerve deficit. She exhibits normal muscle tone. Coordination normal.  Skin: Skin is warm and dry. No rash noted. No erythema. No pallor.  Psychiatric: She has a normal mood and  affect.       Mood good- not seemingly anxious or depressed          Assessment & Plan:

## 2011-03-19 NOTE — Assessment & Plan Note (Signed)
Worse/ more frequent lately on 50 of topamax Will add gabapentin and see if we can transition  Good lifestyle habits and knowledgable about headaches

## 2011-03-19 NOTE — Patient Instructions (Addendum)
You could possibly have fibromyalgia  I want to try gabapentin ( a drug for nerve pain )- to help prevent headaches/ help tingling and also widespread pain Start with 100 mg three times daily  You will probably feel a bit sleepy and dizzy for the first week and that gets better  Update if not starting to improve in a week or if worsening  - or any side effects  Follow up with me in 2-4 weeks  B12 shot today

## 2011-03-19 NOTE — Assessment & Plan Note (Signed)
Strongly suspect fibromyalgia and disc this in detail Along with it worse migraines Trial of gabapentin 100 tid and f/u  Disc poss side eff No depression or anx

## 2011-03-23 ENCOUNTER — Telehealth: Payer: Self-pay | Admitting: *Deleted

## 2011-03-23 MED ORDER — PREGABALIN 75 MG PO CAPS: ORAL_CAPSULE | ORAL | Status: DC

## 2011-03-23 NOTE — Telephone Encounter (Signed)
Ok - stop that and will take lyrica instead  Px written for call in   Update me if any problems F/u as planned

## 2011-03-23 NOTE — Telephone Encounter (Signed)
Patient called to let Dr. Milinda Antis know that she cannot deal with the side effects from Gabapentin.  She stated that she is having headaches, dizziness, and feels "out of it."  She stated that she doesn't feel comfortable taking this medication and driving.  Please advise.

## 2011-03-23 NOTE — Telephone Encounter (Signed)
Patient notified as instructed by telephone.Medication phoned to [facility] as instructed.  

## 2011-03-25 MED ORDER — TOPIRAMATE 25 MG PO CPSP: ORAL_CAPSULE | ORAL | Status: DC

## 2011-03-25 MED ORDER — DULOXETINE HCL 30 MG PO CPEP: 30.0000 mg | ORAL_CAPSULE | Freq: Every day | ORAL | Status: DC

## 2011-03-25 NOTE — Telephone Encounter (Signed)
I sent topamax sprinkle (3 of the 25s per day to equal 75 is the only way to get that dose) Also cymbalta 30  F/u as planned If cymbalta helps may inc dose at f/u  If any side eff - stop it and update me

## 2011-03-25 NOTE — Telephone Encounter (Signed)
Addended by: Roxy Manns A on: 03/25/2011 09:11 PM   Modules accepted: Orders

## 2011-03-25 NOTE — Telephone Encounter (Signed)
Pt called and Lyrica too expensive (cost $106.92 for 30 day supply). Pt wants a different med called in not as expensive. I asked pt to call her insurance co and get list of comparable meds on a more affordable tier and pt will call back with info and I will send to Dr Milinda Antis.

## 2011-03-25 NOTE — Telephone Encounter (Signed)
The topamax will help headaches but not the widespread pain - fine if she wants to go back on it for headaches  First check on the cost of the sprinkle cap- it may be more expensive-let me know For the fibromyalgia have her also check on cost of cymbalta, thanks

## 2011-03-25 NOTE — Telephone Encounter (Signed)
The patient says she has spoken with the insurance and they will not quote a price to her so she says she thinks it would be better to just send those Rx's in to her pharmacy and let her see what the cost is.  The insurance company also stated that there may need to be a prior authorization done.  She does want the Topamax for the headaches, not for other pain but is still having the headaches quite often and wondered if 75 mg would control it better.  [facility].

## 2011-03-25 NOTE — Telephone Encounter (Signed)
Pt called back and said insurance co does not have replacement for Lyrica that is affordable. Pt wondered if could increase Topamax since she has not had side effects and is inexpensive. Pt wonders if could try sprinkle caps instead of tablets. Pt uses [facility] and can be reached at [phone] or [phone].Please advise.

## 2011-03-26 ENCOUNTER — Encounter: Payer: Self-pay | Admitting: Family Medicine

## 2011-03-26 ENCOUNTER — Other Ambulatory Visit: Payer: Self-pay | Admitting: Dermatology

## 2011-03-26 ENCOUNTER — Telehealth: Payer: Self-pay | Admitting: *Deleted

## 2011-03-26 DIAGNOSIS — M797 Fibromyalgia: Secondary | ICD-10-CM

## 2011-03-26 NOTE — Telephone Encounter (Signed)
Done in IN box 

## 2011-03-26 NOTE — Telephone Encounter (Signed)
PA form in your IN box.  Please return form to [facility] when complete.

## 2011-03-26 NOTE — Telephone Encounter (Signed)
Received PA approval for Cymbalta.  Pharmacy and patient notified via telephone.  Approved from 03/26/2011 to 03/25/2012, approval sent to be scanned.

## 2011-03-26 NOTE — Telephone Encounter (Signed)
completed form faxed and given to [facility].

## 2011-03-26 NOTE — Telephone Encounter (Signed)
Patient notified as instructed by telephone. 

## 2011-03-26 NOTE — Telephone Encounter (Signed)
Left vm for pt to callback 

## 2011-03-26 NOTE — Telephone Encounter (Signed)
Received fax from pharmacy stating that PA is needed for Cymbalta 30mg  capsules.  Called [facility] at [phone] and the PA form should be in our office within 30 minutes.

## 2011-03-29 ENCOUNTER — Telehealth: Payer: Self-pay

## 2011-03-29 MED ORDER — AMITRIPTYLINE HCL 10 MG PO TABS: ORAL_TABLET | ORAL | Status: DC

## 2011-03-29 NOTE — Telephone Encounter (Signed)
Those meds are not indicated for fibromyalgia I want to try one more thing- amitriptyline

## 2011-03-29 NOTE — Telephone Encounter (Signed)
Talk to the patient about that first and see what she says

## 2011-03-29 NOTE — Telephone Encounter (Signed)
Addended by: Roxy Manns A on: 03/29/2011 05:32 PM   Modules accepted: Orders

## 2011-03-29 NOTE — Telephone Encounter (Signed)
Take 10 mg of it each bedtime for 7 days and then increase to 2 pills each bedtime if not too sedating  Will send electronically to her pharmacy  Follow up as planned

## 2011-03-29 NOTE — Telephone Encounter (Signed)
Patient called and stated that she cannot afford Cymbalta, it will be over $200.  She stated that the pharmacist told her that her insurance will cover Fluoxetine and Sertraline.  Please advise.  [facility]

## 2011-03-29 NOTE — Telephone Encounter (Signed)
Left message on machine for patient to call back.

## 2011-03-29 NOTE — Telephone Encounter (Signed)
Tim pharmacist at [facility] said Dr Milinda Antis had sent in rx for Topiramate sprinkle 25 mg and Topiramate only comes in 15 mg for sprinkle. Pt cannot swallow capsules and pharmacist wants to know if can switch to 15 mg tablet.Please advise.

## 2011-03-30 MED ORDER — TOPIRAMATE 25 MG PO CPSP: ORAL_CAPSULE | ORAL | Status: DC

## 2011-03-30 NOTE — Telephone Encounter (Signed)
Patient called back to let Dr. Milinda Antis know that [facility] in [facility], [address] has Topiramate 25mg .  She would like the Rx sent there.

## 2011-03-30 NOTE — Telephone Encounter (Signed)
Patient will wait to hear from [facility] regarding referral.

## 2011-03-30 NOTE — Telephone Encounter (Signed)
In that case I do not have a lot of other ideas for the fibromyalgia -- perhaps a referral to rheumatology would be helpful- does she want me to do that?

## 2011-03-30 NOTE — Telephone Encounter (Signed)
Patient advised as instructed via telephone, she stated that she will try another pharmacy [facility]) to see if they have Topiramate in 15mg  and 50mg .  She stated that per her insurance company it comes in 15mg , 25mg , and 50mg .  She will call back and let me know.

## 2011-03-30 NOTE — Telephone Encounter (Signed)
Here is ref to rheum

## 2011-03-30 NOTE — Telephone Encounter (Signed)
Addended by: Roxy Manns A on: 03/30/2011 05:23 PM   Modules accepted: Orders

## 2011-03-30 NOTE — Telephone Encounter (Signed)
Patient advised, Rx sent to [facility] in [facility] per her request.

## 2011-03-30 NOTE — Telephone Encounter (Signed)
Please go ahead and sent in equivalent of 75 mg daily (re check with her to make sure) to her pharmacy 1 mo with 11 ref, thanks

## 2011-03-30 NOTE — Telephone Encounter (Signed)
Correction to the message below:  Patient would needed 50mg  and 25mg  to equal 75mg .

## 2011-03-30 NOTE — Telephone Encounter (Signed)
Patient advised as instructed via telephone.  She stated that she has been on amitriptyline twice in the past and both times she experiences drowsiness and she stated she was "just out of it."  She does not want to try this medication again.  Please advise.

## 2011-03-30 NOTE — Telephone Encounter (Signed)
Patient advised as instructed via telephone she would like referral.

## 2011-03-31 ENCOUNTER — Other Ambulatory Visit: Payer: Self-pay | Admitting: *Deleted

## 2011-03-31 MED ORDER — DULOXETINE HCL 30 MG PO CPEP: 30.0000 mg | ORAL_CAPSULE | Freq: Every day | ORAL | Status: DC

## 2011-03-31 NOTE — Telephone Encounter (Signed)
Patient called back requesting Rx for Cymbalta, she stated that she has a coupon for this medication.  Rx sent electronically to [facility].

## 2011-04-05 ENCOUNTER — Ambulatory Visit: Payer: BC Managed Care – PPO

## 2011-05-04 ENCOUNTER — Ambulatory Visit (INDEPENDENT_AMBULATORY_CARE_PROVIDER_SITE_OTHER): Payer: BC Managed Care – PPO

## 2011-05-04 DIAGNOSIS — E538 Deficiency of other specified B group vitamins: Secondary | ICD-10-CM

## 2011-05-04 MED ORDER — CYANOCOBALAMIN 1000 MCG/ML IJ SOLN
1000.0000 ug | Freq: Once | INTRAMUSCULAR | Status: AC
  Administered 2011-05-04: 1000 ug via INTRAMUSCULAR

## 2011-05-06 ENCOUNTER — Telehealth: Payer: Self-pay | Admitting: Family Medicine

## 2011-05-06 NOTE — Telephone Encounter (Signed)
Patient was here on 4.9.13 for a B12 Injection & is now requesting a note stating she was late because she had to get her injection. Patient ph# [phone] Please fax to [phone]

## 2011-05-10 ENCOUNTER — Encounter: Payer: Self-pay | Admitting: Family Medicine

## 2011-05-10 ENCOUNTER — Telehealth: Payer: Self-pay | Admitting: Family Medicine

## 2011-05-10 ENCOUNTER — Ambulatory Visit (INDEPENDENT_AMBULATORY_CARE_PROVIDER_SITE_OTHER): Payer: BC Managed Care – PPO | Admitting: Family Medicine

## 2011-05-10 VITALS — BP 102/78 | HR 76 | Temp 98.0°F | Ht 64.0 in | Wt 155.2 lb

## 2011-05-10 DIAGNOSIS — J029 Acute pharyngitis, unspecified: Secondary | ICD-10-CM | POA: Insufficient documentation

## 2011-05-10 DIAGNOSIS — J069 Acute upper respiratory infection, unspecified: Secondary | ICD-10-CM

## 2011-05-10 LAB — POCT RAPID STREP A (OFFICE): Rapid Strep A Screen: NEGATIVE

## 2011-05-10 NOTE — Assessment & Plan Note (Signed)
With st (neg rapid strep) and also congestion/ mild cough Disc symptomatic care - see instructions on AVS  Update if not starting to improve in a week or if worsening

## 2011-05-10 NOTE — Progress Notes (Signed)
Subjective:    Patient ID: [name], female    DOB: [birth year], 31 y.o.   MRN: [id number]  HPI Is here with uri symptoms  ST and headache and congestion  Really tired all weekend Throat started hurting sat night - and got worse yesterday eve Ibuprofen did not help   No fever now  Had some chills and aches on sat   Headache on sat- all over her head -not just sinuses  Congestion today - not blowing a lot out  Started a little cough last night   Patient Active Problem List  Diagnoses  . CAVERNOUS HEMANGIOMA, LIVER  . VITAMIN B12 DEFICIENCY  . SMOKER  . ALLERGIC RHINITIS  . GERD  . DYSPEPSIA  . ENDOMETRIOSIS  . Diarrhea  . URINARY FREQUENCY, CHRONIC  . PERSONAL HISTORY OF COLONIC POLYPS  . RENAL CALCULUS, HX OF  . MIGRAINES, HX OF  . APHTHOUS ULCERS  . FIBROCYSTIC BREAST DISEASE  . Hypokalemia  . Fatigue  . Amenorrhea  . Routine general medical examination at a health care facility  . Obesity  . Anemia  . Left leg pain  . Gluten intolerance  . Sinusitis  . Myofascial pain  . Fibromyalgia  . Sore throat   Past Medical History  Diagnosis Date  . GERD (gastroesophageal reflux disease)   . Endometriosis   . Fatigue   . Kidney stone   . Vitamin B12 deficiency   . Migraine   . Personal history of colonic polyps 06/01/2006    sessil serrated adenoma  . Family history of malignant neoplasm of gastrointestinal tract    Past Surgical History  Procedure Date  . Cholecystectomy   . Ovarian cyst surgery   . Laparoscopy 11/2004    endometriosis  . Breast surgery 2004    breast reduction  . Tubal ligation 2008  . Wisdom tooth extraction     with four other teeth as well   History  Substance Use Topics  . Smoking status: Former Smoker -- 0.2 packs/day    Quit date: 01/25/2005  . Smokeless tobacco: Not on file  . Alcohol Use: No     rare   Family History  Problem Relation Age of Onset  . Cervical cancer Mother   . Hypertension Mother   . Diabetes  Mother   . Kidney cancer Mother   . Colon polyps Sister   . Kidney failure Brother   . Breast cancer Maternal Aunt   . Breast cancer Maternal Grandmother   . Colon cancer Paternal Grandfather   . Wilson's disease Father     liver transplant  . Ulcerative colitis Father   . Esophageal cancer Neg Hx   . Stomach cancer Neg Hx   . Rectal cancer Neg Hx    Allergies  Allergen Reactions  . Clindamycin     REACTION: hives, angioedema  . Gabapentin     Dizzy/ worse headache  . Penicillins     REACTION: hives, angioedema   Current Outpatient Prescriptions on File Prior to Visit  Medication Sig Dispense Refill  . cyanocobalamin (,VITAMIN B-12,) 1000 MCG/ML injection Inject 1,000 mcg into the muscle every 30 (thirty) days.      . lansoprazole (PREVACID) 15 MG capsule OTC as directed      . topiramate (TOPAMAX SPRINKLE) 25 MG capsule Take one by mouth in am and 2 by mouth in pm  90 capsule  11   Current Facility-Administered Medications on File Prior to Visit  Medication Dose  Route Frequency Provider Last Rate Last Dose  . cyanocobalamin ((VITAMIN B-12)) injection 1,000 mcg  1,000 mcg Intramuscular Q30 days Judy Pimple, MD   1,000 mcg at 01/11/11 1742    Patient Active Problem List  Diagnoses  . CAVERNOUS HEMANGIOMA, LIVER  . VITAMIN B12 DEFICIENCY  . SMOKER  . ALLERGIC RHINITIS  . GERD  . DYSPEPSIA  . ENDOMETRIOSIS  . Diarrhea  . URINARY FREQUENCY, CHRONIC  . PERSONAL HISTORY OF COLONIC POLYPS  . RENAL CALCULUS, HX OF  . MIGRAINES, HX OF  . APHTHOUS ULCERS  . FIBROCYSTIC BREAST DISEASE  . Hypokalemia  . Fatigue  . Amenorrhea  . Routine general medical examination at a health care facility  . Obesity  . Anemia  . Left leg pain  . Gluten intolerance  . Sinusitis  . Myofascial pain  . Fibromyalgia  . Sore throat   Past Medical History  Diagnosis Date  . GERD (gastroesophageal reflux disease)   . Endometriosis   . Fatigue   . Kidney stone   . Vitamin B12  deficiency   . Migraine   . Personal history of colonic polyps 06/01/2006    sessil serrated adenoma  . Family history of malignant neoplasm of gastrointestinal tract    Past Surgical History  Procedure Date  . Cholecystectomy   . Ovarian cyst surgery   . Laparoscopy 11/2004    endometriosis  . Breast surgery 2004    breast reduction  . Tubal ligation 2008  . Wisdom tooth extraction     with four other teeth as well   History  Substance Use Topics  . Smoking status: Former Smoker -- 0.2 packs/day    Quit date: 01/25/2005  . Smokeless tobacco: Not on file  . Alcohol Use: No     rare   Family History  Problem Relation Age of Onset  . Cervical cancer Mother   . Hypertension Mother   . Diabetes Mother   . Kidney cancer Mother   . Colon polyps Sister   . Kidney failure Brother   . Breast cancer Maternal Aunt   . Breast cancer Maternal Grandmother   . Colon cancer Paternal Grandfather   . Wilson's disease Father     liver transplant  . Ulcerative colitis Father   . Esophageal cancer Neg Hx   . Stomach cancer Neg Hx   . Rectal cancer Neg Hx    Allergies  Allergen Reactions  . Clindamycin     REACTION: hives, angioedema  . Gabapentin     Dizzy/ worse headache  . Penicillins     REACTION: hives, angioedema   Current Outpatient Prescriptions on File Prior to Visit  Medication Sig Dispense Refill  . cyanocobalamin (,VITAMIN B-12,) 1000 MCG/ML injection Inject 1,000 mcg into the muscle every 30 (thirty) days.      . lansoprazole (PREVACID) 15 MG capsule OTC as directed      . topiramate (TOPAMAX SPRINKLE) 25 MG capsule Take one by mouth in am and 2 by mouth in pm  90 capsule  11   Current Facility-Administered Medications on File Prior to Visit  Medication Dose Route Frequency Provider Last Rate Last Dose  . cyanocobalamin ((VITAMIN B-12)) injection 1,000 mcg  1,000 mcg Intramuscular Q30 days Judy Pimple, MD   1,000 mcg at 01/11/11 1742       Review of  Systems Review of Systems  Constitutional: Negative for fever, appetite change, fatigue and unexpected weight change.  ENT pos for st  and congestion and neg for ear pain  Eyes: Negative for pain and visual disturbance. neg for red or painful eyes  Respiratory: Negative for sob or wheeze  Cardiovascular: Negative for cp or palpitations    Gastrointestinal: Negative for nausea, diarrhea and constipation.  Genitourinary: Negative for urgency and frequency.  Skin: Negative for pallor or rash   Neurological: Negative for weakness, light-headedness, numbness and headaches.  Hematological: Negative for adenopathy. Does not bruise/bleed easily.  Psychiatric/Behavioral: Negative for dysphoric mood. The patient is not nervous/anxious.          Objective:   Physical Exam  Constitutional: She appears well-developed and well-nourished. No distress.  HENT:  Head: Normocephalic and atraumatic.  Right Ear: External ear normal.  Left Ear: External ear normal.  Mouth/Throat: No oropharyngeal exudate.       Nares are injected and congested  No sinus tenderness  Throat-mild post injection and post nasal drip    Eyes: Conjunctivae and EOM are normal. Pupils are equal, round, and reactive to light. Right eye exhibits no discharge. Left eye exhibits no discharge.  Neck: Normal range of motion. Neck supple. No thyromegaly present.  Cardiovascular: Normal rate and regular rhythm.   Pulmonary/Chest: Effort normal and breath sounds normal. No respiratory distress. She has no wheezes. She has no rales.  Lymphadenopathy:    She has no cervical adenopathy.  Neurological: She is alert.  Skin: Skin is warm and dry. No rash noted.  Psychiatric: She has a normal mood and affect.          Assessment & Plan:

## 2011-05-10 NOTE — Patient Instructions (Signed)
Drink lots of fluids Continue ibuprofen if it helps Try chloraseptic throat spray for sore throat Also nasal saline spray for nasal congestion  Lots of rest  mucinex is helpful for congestion and cough if you need it  Update if not starting to improve in a week or if worsening

## 2011-05-10 NOTE — Telephone Encounter (Signed)
Will see her then 

## 2011-05-10 NOTE — Telephone Encounter (Signed)
Triage Record Num: [id number] Operator: Lesli Albee Patient Name: [name] Call Date & Time: 05/10/2011 8:30:53AM Patient Phone: [phone] PCP: Audrie Gallus. Tower Patient Gender: Female PCP Fax : Patient DOB: 10/02/1980 Practice Name: [facility] Reason for Call: Caller: [name]/Other; PCP: Roxy Manns A.; CB#: [phone]; ; ; Call regarding Sore Throat; Pt is calling for appt. Doesnt want triage. couldn't get through to the appt line at the office. Appt scheduled at 3:15 with Dr. Milinda Antis. Protocol(s) Used: Information Only Call; No Symptom Triage (Adult) Recommended Outcome per Protocol: Call Provider When Office is Open Reason for Outcome: Requesting regular office appointment Care Advice: ~ 05/10/2011 9:39:11AM Page 1 of 1 [email or web address]

## 2011-05-10 NOTE — Assessment & Plan Note (Signed)
Rapid strep neg Suspect part of early viral uri

## 2011-05-13 ENCOUNTER — Encounter: Payer: Self-pay | Admitting: Family Medicine

## 2011-05-13 ENCOUNTER — Ambulatory Visit (INDEPENDENT_AMBULATORY_CARE_PROVIDER_SITE_OTHER): Payer: BC Managed Care – PPO | Admitting: Family Medicine

## 2011-05-13 ENCOUNTER — Telehealth: Payer: Self-pay

## 2011-05-13 ENCOUNTER — Encounter: Payer: Self-pay | Admitting: *Deleted

## 2011-05-13 VITALS — BP 110/78 | HR 80 | Temp 97.8°F | Wt 153.2 lb

## 2011-05-13 DIAGNOSIS — R05 Cough: Secondary | ICD-10-CM | POA: Insufficient documentation

## 2011-05-13 DIAGNOSIS — R059 Cough, unspecified: Secondary | ICD-10-CM | POA: Insufficient documentation

## 2011-05-13 DIAGNOSIS — H8110 Benign paroxysmal vertigo, unspecified ear: Secondary | ICD-10-CM

## 2011-05-13 MED ORDER — MECLIZINE HCL 25 MG PO TABS: 25.0000 mg | ORAL_TABLET | Freq: Two times a day (BID) | ORAL | Status: AC | PRN

## 2011-05-13 MED ORDER — ALBUTEROL SULFATE HFA 108 (90 BASE) MCG/ACT IN AERS: 2.0000 | INHALATION_SPRAY | Freq: Four times a day (QID) | RESPIRATORY_TRACT | Status: DC | PRN

## 2011-05-13 NOTE — Telephone Encounter (Signed)
Pt seen on 05/10/11. On Tues night 05/11/11 dizziness started on and off with getting up or sudden movement. Pt said this dizziness is different because everything appears to spin. Also h/a on and off,yesterday started with SOB and chest tightness when dizziness would occur., chills on an off but no fever, slight non productive cough. No chest pain or sweating. Dr Milinda Antis out of office this AM and Dr Sharen Hones suggested pt be seen. Pt to see Dr Sharen Hones today at 2:45pm. Pt advised if symptoms worsen or change to call back and also to move slower; no sudden movements. If pt does not feel safe to drive she does have someone who can drive her.

## 2011-05-13 NOTE — Assessment & Plan Note (Addendum)
Anticipate viral URTI causing BPPV. Discussed dx and treatment. Meclizine for nausea/vertigo. epley canal repositioning maneuver in office x2 and sent home with modified epley maneuvers to do at home. discussed avoiding rapid head movements for next 24-48 hours.

## 2011-05-13 NOTE — Progress Notes (Signed)
  Subjective:    Patient ID: [name], female    DOB: [birth year], 31 y.o.   MRN: [id number]  HPI CC: f/u not better  Seen here 05/10/2011 by PCP with Dx viral URTI - ST, cough.  Treated with ibuprofen.  Cold sxs started 1 wk ago.  (coryza, RN, headache, cough ,ST).  Returns today because having 2d h/o dizziness worse when going to bed.  Felt "everything spinning" when lay down and then next morning when awoke similar sxs that lasted seconds to minutes.  Worse with rapid head movements.  Today staying dizzy described as lightheaded.  Also noticing chest tightness and something "stuck in throat".   + chills last 3 days.  Also with diarrhea for last 2 days.  Also had some drainage from ears 1 wk ago.  Did drive here today.  Wonders if chest tightness related to recent change in seasons.  Endorses same tightness as when had bronchitis in past but currently improving dry cough present.  No personal history of blood clots.  + fmhx blood clots - mother and both grandmothers.    Pt s/p BTL, not on hormonal meds.  Never had anything dizziness like this in past.  Has tried mucinex and pseudophed and motrin.  No vomiting, recent swimming, tooth pain.    + h/o ear problems, h/o eczema in ears.  Has had perf TMs in past.  Last was 11/2010.  ENT prescribes ppx abx and ear drops when pt goes on plane rides ([facility]).  No h/o asthma.  No smokers at home.  No sick contacts at home, + sick contacts at work.  Past Medical History  Diagnosis Date  . GERD (gastroesophageal reflux disease)   . Endometriosis   . Fatigue   . Kidney stone   . Vitamin B12 deficiency   . Migraine   . Personal history of colonic polyps 06/01/2006    sessil serrated adenoma  . Family history of malignant neoplasm of gastrointestinal tract      Review of Systems Per HPI    Objective:   Physical Exam  Nursing note and vitals reviewed. Constitutional: She appears well-developed and well-nourished. No distress.    HENT:  Head: Normocephalic and atraumatic.  Right Ear: Hearing, tympanic membrane, external ear and ear canal normal.  Left Ear: Hearing, tympanic membrane, external ear and ear canal normal.  Nose: Nose normal. No mucosal edema or rhinorrhea. Right sinus exhibits no maxillary sinus tenderness and no frontal sinus tenderness. Left sinus exhibits no maxillary sinus tenderness and no frontal sinus tenderness.  Mouth/Throat: Uvula is midline, oropharynx is clear and moist and mucous membranes are normal. No oropharyngeal exudate, posterior oropharyngeal edema, posterior oropharyngeal erythema or tonsillar abscesses.  Eyes: Conjunctivae and EOM are normal. Pupils are equal, round, and reactive to light. No scleral icterus.  Neck: Normal range of motion. Neck supple.  Cardiovascular: Normal rate, regular rhythm, normal heart sounds and intact distal pulses.   No murmur heard. Pulmonary/Chest: Effort normal and breath sounds normal. No respiratory distress. She has no wheezes. She has no rales.  Lymphadenopathy:    She has no cervical adenopathy.  Neurological:       ++ dix hallpike on right with upward torsional nystagmus noted.  Skin: Skin is warm and dry. No rash noted.   epley maneuver performed in office x2.  Good resolution of sxs.     Assessment & Plan:  Orthostatics negative today.

## 2011-05-13 NOTE — Assessment & Plan Note (Addendum)
With chest tightness. Anticipate developed bronchitis, but doubt bacterial.   Treat with albuterol inhaler. Lungs overall clear on exam, good O2 sats.   supportive care discussed. Red flags to return discussed.

## 2011-05-13 NOTE — Patient Instructions (Addendum)
You have BPPV - do exercises provided. For chest tightness, try albuterol inhaler. Let us know how you are doing. If not improving as expected, please return to see [place]  Benign Positional Vertigo Vertigo means you feel like you or your surroundings are moving when they are not. Benign positional vertigo is the most common form of vertigo. Benign means that the cause of your condition is not serious. Benign positional vertigo is more common in older adults. CAUSES  Benign positional vertigo is the result of an upset in the labyrinth system. This is an area in the middle ear that helps control your balance. This may be caused by a viral infection, head injury, or repetitive motion. However, often no specific cause is found. SYMPTOMS  Symptoms of benign positional vertigo occur when you move your head or eyes in different directions. Some of the symptoms may include:  Loss of balance and falls.   Vomiting.   Blurred vision.   Dizziness.   Nausea.   Involuntary eye movements (nystagmus).  DIAGNOSIS  Benign positional vertigo is usually diagnosed by physical exam. If the specific cause of your benign positional vertigo is unknown, your caregiver may perform imaging tests, such as magnetic resonance imaging (MRI) or computed tomography (CT). TREATMENT  Your caregiver may recommend movements or procedures to correct the benign positional vertigo. Medicines such as meclizine, benzodiazepines, and medicines for nausea may be used to treat your symptoms. In rare cases, if your symptoms are caused by certain conditions that affect the inner ear, you may need surgery. HOME CARE INSTRUCTIONS   Follow your caregiver's instructions.   Move slowly. Do not make sudden body or head movements.   Avoid driving.   Avoid operating heavy machinery.   Avoid performing any tasks that would be dangerous to you or others during a vertigo episode.   Drink enough fluids to keep your urine clear or pale  yellow.  SEEK IMMEDIATE MEDICAL CARE IF:   You develop problems with walking, weakness, numbness, or using your arms, hands, or legs.   You have difficulty speaking.   You develop severe headaches.   Your nausea or vomiting continues or gets worse.   You develop visual changes.   Your family or friends notice any behavioral changes.   Your condition gets worse.   You have a fever.   You develop a stiff neck or sensitivity to light.  MAKE SURE YOU:   Understand these instructions.   Will watch your condition.   Will get help right away if you are not doing well or get worse.  Document Released: 10/19/2005 Document Revised: 12/31/2010 Document Reviewed: 10/01/2010 [facility] Patient Information 2012 [place], [place].

## 2011-05-13 NOTE — Progress Notes (Signed)
Addended by: Eustaquio Boyden on: 05/13/2011 07:32 PM   Modules accepted: Level of Service

## 2011-06-28 ENCOUNTER — Ambulatory Visit (INDEPENDENT_AMBULATORY_CARE_PROVIDER_SITE_OTHER): Payer: BC Managed Care – PPO

## 2011-06-28 DIAGNOSIS — D519 Vitamin B12 deficiency anemia, unspecified: Secondary | ICD-10-CM

## 2011-06-28 DIAGNOSIS — D518 Other vitamin B12 deficiency anemias: Secondary | ICD-10-CM

## 2011-06-28 MED ORDER — CYANOCOBALAMIN 1000 MCG/ML IJ SOLN
1000.0000 ug | Freq: Once | INTRAMUSCULAR | Status: AC
  Administered 2011-06-28: 1000 ug via INTRAMUSCULAR

## 2011-07-27 ENCOUNTER — Ambulatory Visit: Payer: BC Managed Care – PPO | Admitting: Family Medicine

## 2011-08-17 ENCOUNTER — Ambulatory Visit: Payer: BC Managed Care – PPO

## 2011-08-20 ENCOUNTER — Ambulatory Visit (INDEPENDENT_AMBULATORY_CARE_PROVIDER_SITE_OTHER): Payer: BC Managed Care – PPO | Admitting: *Deleted

## 2011-08-20 DIAGNOSIS — E538 Deficiency of other specified B group vitamins: Secondary | ICD-10-CM

## 2011-08-20 MED ORDER — CYANOCOBALAMIN 1000 MCG/ML IJ SOLN
1000.0000 ug | Freq: Once | INTRAMUSCULAR | Status: AC
  Administered 2011-08-20: 1000 ug via INTRAMUSCULAR

## 2011-09-07 ENCOUNTER — Telehealth: Payer: Self-pay | Admitting: *Deleted

## 2011-09-07 NOTE — Telephone Encounter (Signed)
Please call her to verify what she is on and I will hold form on my desk- let me know

## 2011-09-07 NOTE — Telephone Encounter (Signed)
Received refill request from pharmacy for Topiramate tabs, 25 mg.  This does not match medication sheet which shows Topamax sprinkles, capsule. Form is in your in box to complete and fax back.

## 2011-09-07 NOTE — Telephone Encounter (Signed)
Left message on patient Vm to call and give [place] the brand of Toprimate  She is on.

## 2011-09-08 MED ORDER — TOPIRAMATE 25 MG PO TABS: ORAL_TABLET | ORAL | Status: DC

## 2011-09-08 NOTE — Telephone Encounter (Signed)
Thanks- I will refil this electronically

## 2011-09-08 NOTE — Telephone Encounter (Signed)
Pt is presently taking and wants to continue Topiramate tabs 25 mg taking 1 tab in AM and 2 tabs in evening. Pt contact # [phone] if needed.

## 2011-09-08 NOTE — Addendum Note (Signed)
Addended by: Roxy Manns A on: 09/08/2011 12:10 PM   Modules accepted: Orders

## 2011-09-24 ENCOUNTER — Other Ambulatory Visit: Payer: Self-pay

## 2011-10-01 ENCOUNTER — Other Ambulatory Visit: Payer: Self-pay

## 2011-10-01 NOTE — Telephone Encounter (Signed)
Pt left v/m; has a friend who is a nurse that can give pt monthly vit b 12 injections. Pt request vit b 12 called to [facility] [place].Please advise.

## 2011-10-01 NOTE — Telephone Encounter (Signed)
I also refilled her topiramate sprinkle caps-form is in IN box

## 2011-10-01 NOTE — Telephone Encounter (Signed)
You can give her refills for the year, thanks

## 2011-10-04 MED ORDER — CYANOCOBALAMIN 1000 MCG/ML IJ SOLN: 1000.0000 ug | INTRAMUSCULAR | Status: DC

## 2011-10-04 NOTE — Telephone Encounter (Signed)
Rx Vitamin B12 called in to [facility] [phone] dispense 1 ml with 4R.

## 2011-10-06 ENCOUNTER — Other Ambulatory Visit: Payer: Self-pay | Admitting: *Deleted

## 2011-10-06 NOTE — Telephone Encounter (Signed)
Go ahead and refil the B12 for 12 months In past - she preferred the sprinkle cap of topiramate.... But form says she has been on the tablet Please call her and ask what she is taking and what she prefers  There has always been a lot of confusion about this med from her ins co.

## 2011-10-06 NOTE — Telephone Encounter (Signed)
Ok for 12 months of refills on the vitamin B12- thanks

## 2011-10-06 NOTE — Telephone Encounter (Addendum)
Form for drug clarification in your IN box. Also, ok to refill B-12 as above?

## 2011-10-07 MED ORDER — "NEEDLE (DISP) 18G X 1"" MISC": Status: DC

## 2011-10-07 MED ORDER — SYRINGE (DISPOSABLE) 3 ML MISC: Status: DC

## 2011-10-07 MED ORDER — "NEEDLE (DISP) 25G X 1"" MISC": Status: DC

## 2011-10-07 MED ORDER — CYANOCOBALAMIN 1000 MCG/ML IJ SOLN: 1000.0000 ug | INTRAMUSCULAR | Status: DC

## 2011-10-07 NOTE — Telephone Encounter (Signed)
Ok, I will fill out paperwork when I return Please send px for syringes/ needles to her pharmacy-thanks (12 mo of refils is fine)

## 2011-10-07 NOTE — Telephone Encounter (Signed)
Needles and syringes sent to [facility] as directed.

## 2011-10-07 NOTE — Telephone Encounter (Signed)
Spoke with patient and she prefers the topiramate tablet now due to cost. She also needs needles and syringes for b-12 sent to [facility] in [place].

## 2011-11-01 ENCOUNTER — Encounter: Payer: Self-pay | Admitting: Family Medicine

## 2011-11-01 ENCOUNTER — Encounter: Payer: Self-pay | Admitting: *Deleted

## 2011-11-01 ENCOUNTER — Ambulatory Visit (INDEPENDENT_AMBULATORY_CARE_PROVIDER_SITE_OTHER): Payer: BC Managed Care – PPO | Admitting: Family Medicine

## 2011-11-01 VITALS — BP 126/70 | HR 76 | Temp 98.0°F | Wt 158.2 lb

## 2011-11-01 DIAGNOSIS — Z0100 Encounter for examination of eyes and vision without abnormal findings: Secondary | ICD-10-CM

## 2011-11-01 DIAGNOSIS — R519 Headache, unspecified: Secondary | ICD-10-CM | POA: Insufficient documentation

## 2011-11-01 DIAGNOSIS — R51 Headache: Secondary | ICD-10-CM | POA: Insufficient documentation

## 2011-11-01 NOTE — Assessment & Plan Note (Signed)
Normal snellen today.  Asked her to check on ergonomics and posture at work computer ?MOH vs TTH - treat for both with stopping NSAIDs and starting flexeril prn HA at onset. No recent migraines, doubt related. If continued sxs, recommended she keep HA diary for triggers or aggravating/relieving factors and f/u with [place]. Has flexeril at work.

## 2011-11-01 NOTE — Patient Instructions (Addendum)
Vision screen today. Check for ergonomic assessment at work. I'd like [place] to change motrin to flexeril for headache treatment - that way we will be treating both medication overuse headache and tension type headache. Let us know if not improving with these suggestions. Good to see you today, call us with questions. Get flu shot at work.

## 2011-11-01 NOTE — Progress Notes (Signed)
  Subjective:    Patient ID: [name], female    DOB: December 09, 1980, 31 y.o.   MRN: [id number]  HPI CC: HAs and fatigue  Increased frequency of headaches in last 2 months.  Has had constant headache for last 3 days - today feels like sinus pressure headache, prior frontal.  Goes to sleep and wakes up with headache.  Feeling more dehydrated although has been drinking more water recently.  Also having some posterior neck pain recently, no stiffness.  Works at desk all day - wonders if vision has changed because having more blurred vision.  8 hours in front of computer screen at work.  Has not had ergonomic evaluation.  Sleeps enough at night - about 8 hours/night.  Last eye appt was 1 yr ago.   So far has taken motrin for this.  Taking 400-600mg  daily.  Takes regularly for last several weeks.  If takes too much gets upset stomach.  Takes topamax for migraine prevention.  Has been on this for years. Takes flexeril for fibromyalgia and to help sleep at night.  Has not used recently zoloft for fibromyalgia.  Has been on this for 7 months.  Denies significant change or increased stress in last 2 months.   Doesn't drink caffeine.  Doesn't eat fried foods.  No h/o allergies. Denies fevers/chills, coughing, sneezing, congestion.  No rashes, new joint pains other than fibro.  Did get bit by tick x2 right side in April 2013, never fully healed.  Past Medical History  Diagnosis Date  . GERD (gastroesophageal reflux disease)   . Endometriosis   . Fatigue   . Kidney stone   . Vitamin B12 deficiency   . Migraine   . Personal history of colonic polyps 06/01/2006    sessil serrated adenoma  . Family history of malignant neoplasm of gastrointestinal tract      Review of Systems Per HPI    Objective:   Physical Exam  Nursing note and vitals reviewed. Constitutional: She appears well-developed and well-nourished. No distress.  HENT:  Head: Normocephalic and atraumatic.  Mouth/Throat:  Oropharynx is clear and moist. No oropharyngeal exudate.  Eyes: Conjunctivae normal and EOM are normal. Pupils are equal, round, and reactive to light. No scleral icterus.  Neck: Normal range of motion. Neck supple.  Cardiovascular: Normal rate, regular rhythm, normal heart sounds and intact distal pulses.   No murmur heard. Pulmonary/Chest: Effort normal and breath sounds normal. No respiratory distress. She has no wheezes. She has no rales.  Musculoskeletal: She exhibits no edema.       FROM at neck.  Mild tightness of trap bilaterally  Lymphadenopathy:    She has no cervical adenopathy.  Neurological: No cranial nerve deficit.       CN 2-12 intact.  Skin: Skin is warm and dry. No rash noted.       Resolving hyperpigmented papules R lateral abd  Psychiatric: She has a normal mood and affect.      Assessment & Plan:

## 2011-11-17 ENCOUNTER — Telehealth: Payer: Self-pay | Admitting: Family Medicine

## 2011-11-17 ENCOUNTER — Encounter: Payer: Self-pay | Admitting: Family Medicine

## 2011-11-17 ENCOUNTER — Ambulatory Visit (INDEPENDENT_AMBULATORY_CARE_PROVIDER_SITE_OTHER): Payer: BC Managed Care – PPO | Admitting: Family Medicine

## 2011-11-17 VITALS — BP 110/70 | HR 72 | Temp 98.4°F | Wt 156.0 lb

## 2011-11-17 DIAGNOSIS — J329 Chronic sinusitis, unspecified: Secondary | ICD-10-CM

## 2011-11-17 MED ORDER — AZITHROMYCIN 250 MG PO TABS: ORAL_TABLET | ORAL | Status: DC

## 2011-11-17 NOTE — Progress Notes (Signed)
HA started weeks ago and was seen by Dr. Reece Agar.  This wasn't typical for migraines.  Then in last week had more nasal pressure and a nose bleed yesterday.  More ear pressure in last few days.  Throat feels dry and scratchy.  Fatigued.  Using nasal saline with some help over the last few days.  She's trying to avoid nsaids, tylenol.  No fevers.  Mild dry cough in last 2 days.   Not sleeping well- she wakes in the middle of the night.  Taking melatonin for sleep.  Quit smoking.    She got a glare reducing screen for eye strain at work, and new chair.  This has helped some.    Meds, vitals, and allergies reviewed.   ROS: See HPI.  Otherwise, noncontributory.  GEN: nad, alert and oriented HEENT: mucous membranes moist, tm w/o erythema, nasal exam w/o erythema- no bleeding site seen, clear discharge noted,  OP with cobblestoning, sinuses ttp x4 NECK: supple w/o LA CV: rrr.   PULM: ctab, no inc wob EXT: no edema SKIN: no acute rash

## 2011-11-17 NOTE — Telephone Encounter (Signed)
Caller: [name]/Patient; Patient Name: [name]; PCP: ; Janith Lima Phone Number: [phone]  LMP 11/17/11. Patient states she developed frontal headache, onset X 3 weeks. States she developed sinus pressure and congestion under eyes, onset 11/12/11. Afebrile. Denies sore throat. States she had small amount of bleeding from nose with blowing of nose 11/15/11. Patient states sehe has taken Ibuprofen for headache without relief. Triage per Upper Respiratory Infection and Headache Protocol. No emergent sx identified. Care advice given per guidelines related to positive triage assessment for " Mild to moderate Headache unrelieved by nonprescription medication". Patient advised saline nasal washes/netty pot, inhaled steam, humidifier, warm compresses to face. Call back parameters reviewed. Appt. scheduled within 24 ours, per Protocol.  Appt. scheduled for 11/17/11 1545 with Dr. Para March.

## 2011-11-17 NOTE — Assessment & Plan Note (Signed)
Can tolerate zmax.  Use this, avoid flonase due to h/o recent epistaxis and f/u prn.  Nontoxic, continue nasal saline.  F/u prn.

## 2011-11-17 NOTE — Patient Instructions (Addendum)
Drink plenty of fluids, start the antibiotics, and gargle with warm salt water for your throat.  This should gradually improve.  Take care.  Let us know if you have other concerns.

## 2011-11-22 ENCOUNTER — Ambulatory Visit
Admission: RE | Admit: 2011-11-22 | Discharge: 2011-11-22 | Disposition: A | Discharge status: Home / Self Care | Payer: BC Managed Care – PPO | Source: Ambulatory Visit | Attending: Family Medicine | Admitting: Family Medicine

## 2011-11-22 DIAGNOSIS — Z1231 Encounter for screening mammogram for malignant neoplasm of breast: Secondary | ICD-10-CM

## 2011-11-23 ENCOUNTER — Other Ambulatory Visit: Payer: Self-pay | Admitting: Family Medicine

## 2011-11-23 DIAGNOSIS — N63 Unspecified lump in unspecified breast: Secondary | ICD-10-CM

## 2011-11-23 DIAGNOSIS — N644 Mastodynia: Secondary | ICD-10-CM

## 2011-12-02 ENCOUNTER — Ambulatory Visit
Admission: RE | Admit: 2011-12-02 | Discharge: 2011-12-02 | Disposition: A | Discharge status: Home / Self Care | Payer: BC Managed Care – PPO | Source: Ambulatory Visit | Attending: Family Medicine | Admitting: Family Medicine

## 2011-12-02 DIAGNOSIS — N63 Unspecified lump in unspecified breast: Secondary | ICD-10-CM

## 2011-12-02 DIAGNOSIS — N644 Mastodynia: Secondary | ICD-10-CM

## 2012-02-25 ENCOUNTER — Telehealth: Payer: Self-pay | Admitting: *Deleted

## 2012-02-25 NOTE — Telephone Encounter (Signed)
Pt returned call asking to speak to [facility].  Pt states she had called earlier requesting RX for focus and concentration.  She has decreased Topamax to 50mg  and it is still affecting "work and daily activities".  She heard Concerta was a good medication and would like RX for this until she can return for her physical in March to be sent to  [facility] [employer].

## 2012-02-25 NOTE — Telephone Encounter (Signed)
I need to see her to discuss her symptoms and see if she needs evaluation for possible attention deficit disorder - please make that a 30 min appt  We cannot px any med until we verify that is what she has - and concerta (or other stimulants) are controlled drugs - so we have to discuss the process of px them which is somewhat complicated F/u when able-thanks

## 2012-02-25 NOTE — Telephone Encounter (Signed)
Left message for pt to return call.

## 2012-02-25 NOTE — Telephone Encounter (Signed)
Pt lmov stating that she is having trouble focusing wants to start a medication and that Concerta is covered by her insurance. She also says that she decreased topamax to 50 mg daily to see if that would help as you had advised 8 months ago and it has not helped.   I called pt back at cell # to advise her to make an appt but I got voicemail and lmom for her to return call to our office and to ask to speak with [facility]. ( I did not want to leave detailed message on voicemail)

## 2012-02-28 NOTE — Telephone Encounter (Signed)
Left voicemail requesting pt to call office 

## 2012-02-29 ENCOUNTER — Encounter: Payer: Self-pay | Admitting: Family Medicine

## 2012-02-29 ENCOUNTER — Ambulatory Visit (INDEPENDENT_AMBULATORY_CARE_PROVIDER_SITE_OTHER): Payer: BC Managed Care – PPO | Admitting: Family Medicine

## 2012-02-29 VITALS — BP 106/62 | HR 82 | Temp 98.5°F | Ht 64.0 in | Wt 161.0 lb

## 2012-02-29 DIAGNOSIS — F988 Other specified behavioral and emotional disorders with onset usually occurring in childhood and adolescence: Secondary | ICD-10-CM | POA: Insufficient documentation

## 2012-02-29 DIAGNOSIS — R41840 Attention and concentration deficit: Secondary | ICD-10-CM

## 2012-02-29 NOTE — Assessment & Plan Note (Signed)
Long intake today re: concentration problems/ inability to finish tasks/ multitask or keep up with work  Also long hx of problems at school  I do suspect ADD or other learning problem Will have her formally evaulated and then disc tx opt (I am worried that stimulant products could worsen her headaches and we disc this) >25 min spent with face to face with patient, >50% counseling and/or coordinating care

## 2012-02-29 NOTE — Telephone Encounter (Signed)
Appt made on 02/25/12 for pt to come in on 02/29/12.

## 2012-02-29 NOTE — Patient Instructions (Addendum)
We will refer you to counselor for assessment for ADD at check out  Try to keep organized, as much as possible- and leave time for tasks, ask for help when necessary  Try to do one thing at a time as much as possible  Keep doing yoga  Will make plan when assessment returns

## 2012-02-29 NOTE — Progress Notes (Signed)
Subjective:    Patient ID: [name], female    DOB: February 11, 1980, 32 y.o.   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 -- all at the same time - has made a lot of mistakes lately  Gets sidetracked easily  Even when socializing - has a problem listening   Never saw a doctor as a child about this because her parents had no Psychologist, prison and probation services at school were always concerned about ADD or leaning disability Grades were bad all the way though  She did not finish HS -- then went back to get her GED - and had a lot of problems with that (math is the hardest thing for her)  Comprehension is troublesome - really needs one on one instruction   Her daughter has dyslexia - this worries her   Her symptoms are worsening with time - more accounting responsibilities at work  Cannot take time to concentrate Her boss is angry and frustrated with her - this makes her anxious   Overall does pretty well with mood Does exercise- does yoga Her stressors are decreasing   Patient Active Problem List  Diagnosis  . CAVERNOUS HEMANGIOMA, LIVER  . VITAMIN B12 DEFICIENCY  . SMOKER  . ALLERGIC RHINITIS  . GERD  . DYSPEPSIA  . ENDOMETRIOSIS  . Diarrhea  . URINARY FREQUENCY, CHRONIC  . PERSONAL HISTORY OF COLONIC POLYPS  . RENAL CALCULUS, HX OF  . MIGRAINES, HX OF  . APHTHOUS ULCERS  . FIBROCYSTIC BREAST DISEASE  . Hypokalemia  . Fatigue  . Amenorrhea  . Routine general medical examination at a health care facility  . Obesity  . Anemia  . Left leg pain  . Gluten intolerance  . Sinusitis  . Myofascial pain  . Fibromyalgia  . Sore throat  . Viral URI  . BPPV  (benign paroxysmal positional vertigo)  . Headache   Past Medical History  Diagnosis Date  . GERD (gastroesophageal reflux disease)   . Endometriosis   . Fatigue   . Kidney stone   . Vitamin B12 deficiency   . Migraine   . Personal history of colonic polyps 06/01/2006    sessil serrated adenoma  . Family history of malignant neoplasm of gastrointestinal tract    Past Surgical History  Procedure Date  . Cholecystectomy   . Ovarian cyst surgery   . Laparoscopy 11/2004    endometriosis  . Breast surgery 2004    breast reduction  . Tubal ligation 2008  . Wisdom tooth extraction     with four other teeth as well   History  Substance Use Topics  . Smoking status: Former Smoker -- 0.2 packs/day    Quit date: 01/25/2005  . Smokeless tobacco: Not on file  . Alcohol Use: No     Comment: rare   Family History  Problem Relation Age of Onset  . Cervical cancer Mother   . Hypertension Mother   . Diabetes Mother   . Kidney cancer Mother   . Colon polyps Sister   . Kidney failure Brother   . Breast cancer Maternal Aunt   . Breast cancer  Maternal Grandmother   . Colon cancer Paternal Grandfather   . Wilson's disease Father     liver transplant  . Ulcerative colitis Father   . Esophageal cancer Neg Hx   . Stomach cancer Neg Hx   . Rectal cancer Neg Hx    Allergies  Allergen Reactions  . Clindamycin     REACTION: hives, angioedema  . Gabapentin     Dizzy/ worse headache  . Penicillins     REACTION: hives, angioedema   Current Outpatient Prescriptions on File Prior to Visit  Medication Sig Dispense Refill  . albuterol (PROVENTIL HFA;VENTOLIN HFA) 108 (90 BASE) MCG/ACT inhaler Inhale 2 puffs into the lungs every 6 (six) hours as needed for wheezing.  1 Inhaler  2  . cyanocobalamin (,VITAMIN B-12,) 1000 MCG/ML injection Inject 1 mL (1,000 mcg total) into the muscle every 30 (thirty) days.  10 mL  1  . lansoprazole (PREVACID) 15 MG capsule OTC as directed      . NEEDLE,  DISP, 25 G 25G X 1" MISC Use to administer B-12 solution IM as directed.  25 each  0  . sertraline (ZOLOFT) 50 MG tablet Take 50 mg by mouth daily.      . cyclobenzaprine (FLEXERIL) 10 MG tablet Take 10 mg by mouth 2 (two) times daily as needed.                Review of Systems Review of Systems  Constitutional: Negative for fever, appetite change, fatigue and unexpected weight change.  Eyes: Negative for pain and visual disturbance.  Respiratory: Negative for cough and shortness of breath.   Cardiovascular: Negative for cp or palpitations    Gastrointestinal: Negative for nausea, diarrhea and constipation.  Genitourinary: Negative for urgency and frequency.  Skin: Negative for pallor or rash   Neurological: Negative for weakness, light-headedness, numbness and pos for  Hematological: Negative for adenopathy. Does not bruise/bleed easily.  Psychiatric/Behavioral: Negative for dysphoric mood. The patient is not nervous/anxious.  Pos for difficulty concentrating and finishing tasks, neg for hyperactivity       Objective:   Physical Exam  Constitutional: She is oriented to person, place, and time. She appears well-developed and well-nourished. No distress.  HENT:  Head: Normocephalic and atraumatic.  Mouth/Throat: Oropharynx is clear and moist.  Eyes: Conjunctivae normal and EOM are normal. Pupils are equal, round, and reactive to light. Right eye exhibits no discharge. Left eye exhibits no discharge. No scleral icterus.  Neck: Normal range of motion. Neck supple. No thyromegaly present.  Cardiovascular: Normal rate, regular rhythm and normal heart sounds.   Pulmonary/Chest: Effort normal and breath sounds normal. No respiratory distress. She has no wheezes.  Musculoskeletal: She exhibits no tenderness.  Lymphadenopathy:    She has no cervical adenopathy.  Neurological: She is alert and oriented to person, place, and time. She has normal reflexes. She displays no atrophy and no  tremor. No cranial nerve deficit or sensory deficit. She exhibits normal muscle tone. Coordination normal.  Skin: Skin is warm. No rash noted. No erythema. No pallor.  Psychiatric: Her behavior is normal. Judgment and thought content normal. Her mood appears not anxious. Her affect is not blunt and not labile. Her speech is not rapid and/or pressured, not delayed and not tangential. Cognition and memory are normal. She does not exhibit a depressed mood. She exhibits normal recent memory and normal remote memory.          Assessment & Plan:

## 2012-02-29 NOTE — Telephone Encounter (Signed)
appt scheduled for today

## 2012-03-11 ENCOUNTER — Other Ambulatory Visit: Payer: Self-pay

## 2012-03-15 ENCOUNTER — Ambulatory Visit (INDEPENDENT_AMBULATORY_CARE_PROVIDER_SITE_OTHER): Payer: BC Managed Care – PPO | Admitting: Psychology

## 2012-03-15 DIAGNOSIS — F909 Attention-deficit hyperactivity disorder, unspecified type: Secondary | ICD-10-CM

## 2012-03-24 ENCOUNTER — Other Ambulatory Visit (INDEPENDENT_AMBULATORY_CARE_PROVIDER_SITE_OTHER): Payer: BC Managed Care – PPO

## 2012-03-24 DIAGNOSIS — F909 Attention-deficit hyperactivity disorder, unspecified type: Secondary | ICD-10-CM

## 2012-04-04 ENCOUNTER — Ambulatory Visit (INDEPENDENT_AMBULATORY_CARE_PROVIDER_SITE_OTHER): Payer: BC Managed Care – PPO | Admitting: Psychology

## 2012-04-04 DIAGNOSIS — F909 Attention-deficit hyperactivity disorder, unspecified type: Secondary | ICD-10-CM

## 2012-04-13 ENCOUNTER — Telehealth: Payer: Self-pay

## 2012-04-13 NOTE — Telephone Encounter (Addendum)
plz notify patient - this will have to wait for Dr. Milinda Antis as I have no information from Dr. Laymond Purser.

## 2012-04-13 NOTE — Telephone Encounter (Signed)
Pt left v/m and following up from visit on 04/04/12 with Dr Laymond Purser. Pt understood Dr Laymond Purser would forward testing and recommendation for medication to Dr Milinda Antis. Pt request call back. (Dr Milinda Antis does not return until 04/17/12.)Please advise.

## 2012-04-14 NOTE — Telephone Encounter (Signed)
Pt made a CPE appt through mychart for Monday so she said she will discuss this information with Dr. Milinda Antis when she sees her

## 2012-04-17 ENCOUNTER — Ambulatory Visit (INDEPENDENT_AMBULATORY_CARE_PROVIDER_SITE_OTHER): Payer: BC Managed Care – PPO | Admitting: Family Medicine

## 2012-04-17 ENCOUNTER — Encounter: Payer: Self-pay | Admitting: Family Medicine

## 2012-04-17 VITALS — BP 114/64 | HR 58 | Temp 98.5°F | Ht 63.5 in | Wt 161.0 lb

## 2012-04-17 DIAGNOSIS — Z0000 Encounter for general adult medical examination without abnormal findings: Secondary | ICD-10-CM

## 2012-04-17 DIAGNOSIS — E538 Deficiency of other specified B group vitamins: Secondary | ICD-10-CM

## 2012-04-17 DIAGNOSIS — R41840 Attention and concentration deficit: Secondary | ICD-10-CM

## 2012-04-17 MED ORDER — AMPHETAMINE-DEXTROAMPHET ER 10 MG PO CP24: ORAL_CAPSULE | ORAL | Status: DC

## 2012-04-17 NOTE — Progress Notes (Signed)
Subjective:    Patient ID: [name], female    DOB: 05-22-1980, 32 y.o.   MRN: [id number]  HPI Here for health maintenance exam and to review chronic medical problems    Has ongoing concentration issues - especially at work  Recent eval with CNS-VS yielded such severely low scores that the assessment was considered invalid  ? If trouble reading directions  Pt states after sitting for a while she was just too fidgity to pay attention  And memorization was the hardest part - then became overwhelmed - but continued to try to do her best - brought on a panicy feeling  At work the same thing happens  Her reading level is enough to get through her job  She remembers as far back as middle school -having problems   Is on zoloft for anxiety  Definitely more mellow than she used to be - does not feel overly anxious on a regular basis  Has been written up for her performance at work - making a lot of errors  She tends to space out - hard to get her attention back  She works and moves constantly - does not sit down/ watch TV - etc gets bored and antsy, cannot sit at work - has to get up and move  Is worse - much more hyper if she has any caffeine or chocolate- avoids that entirely Does not drink alcohol often- seldom has a glass of wine - that makes her relaxed and sleepy  Doing well with headaches in general - better than she was previously Stress level is not too high   Wt is stable with bmi of 28  Last pap was 3/12 She sees gyn - last visit oct 2013   Smoking status - has not smoked in many years -never will smoke again   colonosc for polyps was 1/13- came back normal   Will get labs today   Patient Active Problem List  Diagnosis  . CAVERNOUS HEMANGIOMA, LIVER  . VITAMIN B12 DEFICIENCY  . SMOKER  . ALLERGIC RHINITIS  . GERD  . DYSPEPSIA  . ENDOMETRIOSIS  . Diarrhea  . URINARY FREQUENCY, CHRONIC  . PERSONAL HISTORY OF COLONIC POLYPS  . RENAL CALCULUS, HX OF  .  MIGRAINES, HX OF  . APHTHOUS ULCERS  . FIBROCYSTIC BREAST DISEASE  . Hypokalemia  . Fatigue  . Amenorrhea  . Routine general medical examination at a health care facility  . Obesity  . Anemia  . Left leg pain  . Gluten intolerance  . Myofascial pain  . Fibromyalgia  . BPPV (benign paroxysmal positional vertigo)  . Poor concentration   Past Medical History  Diagnosis Date  . GERD (gastroesophageal reflux disease)   . Endometriosis   . Fatigue   . Kidney stone   . Vitamin B12 deficiency   . Migraine   . Personal history of colonic polyps 06/01/2006    sessil serrated adenoma  . Family history of malignant neoplasm of gastrointestinal tract    Past Surgical History  Procedure Laterality Date  . Cholecystectomy    . Ovarian cyst surgery    . Laparoscopy  11/2004    endometriosis  . Breast surgery  2004    breast reduction  . Tubal ligation  2008  . Wisdom tooth extraction      with four other teeth as well   History  Substance Use Topics  . Smoking status: Former Smoker -- 0.25 packs/day    Quit date:  01/25/2005  . Smokeless tobacco: Not on file  . Alcohol Use: No     Comment: rare   Family History  Problem Relation Age of Onset  . Cervical cancer Mother   . Hypertension Mother   . Diabetes Mother   . Kidney cancer Mother   . Colon polyps Sister   . Kidney failure Brother   . Breast cancer Maternal Aunt   . Breast cancer Maternal Grandmother   . Colon cancer Paternal Grandfather   . Wilson's disease Father     liver transplant  . Ulcerative colitis Father   . Esophageal cancer Neg Hx   . Stomach cancer Neg Hx   . Rectal cancer Neg Hx    Allergies  Allergen Reactions  . Strawberry Anaphylaxis and Hives  . Clindamycin     REACTION: hives, angioedema  . Gabapentin     Dizzy/ worse headache  . Penicillins     REACTION: hives, angioedema   Current Outpatient Prescriptions on File Prior to Visit  Medication Sig Dispense Refill  . cyanocobalamin  (,VITAMIN B-12,) 1000 MCG/ML injection Inject 1 mL (1,000 mcg total) into the muscle every 30 (thirty) days.  10 mL  1  . lansoprazole (PREVACID) 15 MG capsule as needed. OTC as directed      . NEEDLE, DISP, 25 G 25G X 1" MISC Use to administer B-12 solution IM as directed.  25 each  0  . sertraline (ZOLOFT) 50 MG tablet Take 50 mg by mouth daily.       No current facility-administered medications on file prior to visit.      Review of Systems Review of Systems  Constitutional: Negative for fever, appetite change, fatigue and unexpected weight change.  Eyes: Negative for pain and visual disturbance.  Respiratory: Negative for cough and shortness of breath.   Cardiovascular: Negative for cp or palpitations    Gastrointestinal: Negative for nausea, diarrhea and constipation.  Genitourinary: Negative for urgency and frequency.  Skin: Negative for pallor or rash   Neurological: Negative for weakness, light-headedness, numbness and headaches.  Hematological: Negative for adenopathy. Does not bruise/bleed easily.  Psychiatric/Behavioral: Negative for dysphoric mood. The patient is not nervous/anxious.  pos for difficulty concentrating        Objective:   Physical Exam  Constitutional: She appears well-developed and well-nourished. No distress.  HENT:  Head: Normocephalic and atraumatic.  Right Ear: External ear normal.  Left Ear: External ear normal.  Nose: Nose normal.  Mouth/Throat: Oropharynx is clear and moist.  Eyes: Conjunctivae and EOM are normal. Pupils are equal, round, and reactive to light. Right eye exhibits no discharge. Left eye exhibits no discharge. No scleral icterus.  Neck: Normal range of motion. Neck supple. No JVD present. Carotid bruit is not present. No thyromegaly present.  Cardiovascular: Normal rate, regular rhythm, normal heart sounds and intact distal pulses.  Exam reveals no gallop.   Pulmonary/Chest: Effort normal and breath sounds normal. No respiratory  distress. She has no wheezes.  Abdominal: Soft. Bowel sounds are normal. She exhibits no distension, no abdominal bruit and no mass. There is no tenderness.  Musculoskeletal: She exhibits no edema and no tenderness.  Lymphadenopathy:    She has no cervical adenopathy.  Neurological: She is alert. She has normal reflexes. She displays no atrophy and no tremor. No cranial nerve deficit. She exhibits normal muscle tone. Coordination and gait normal.  Skin: Skin is warm and dry. No rash noted. No erythema. No pallor.  Psychiatric: She has  a normal mood and affect. Her behavior is normal.  Attentive and pleasant  Answers questions appropriately          Assessment & Plan:

## 2012-04-17 NOTE — Patient Instructions (Signed)
Labs today Try the adderall- if side effects- stop it and let me know  If this does not work - we will refer you for additional evaluation  Keep up healthy diet and exercise Follow up with me in about 2 months

## 2012-04-17 NOTE — Assessment & Plan Note (Signed)
Pt was eval with CNS vital signs assessment and performed so poorly that her results were considered invalid Disc this in detail- her symptoms are consistent with ADHD quite strongly and she does not seem of very low intelligence Also states her anxiety is in good control  Will try low dose adderall 10 mg bid -follow up in 2 mo  Disc way to use this and refil in detail If not imp - I think psychiatry eval for mood disorder or learning disability would be helpful

## 2012-04-17 NOTE — Telephone Encounter (Signed)
Will see her then 

## 2012-04-17 NOTE — Assessment & Plan Note (Signed)
Reviewed health habits including diet and exercise and skin cancer prevention Also reviewed health mt list, fam hx and immunizations  utd imms and colonoscopy Lab today  Enc to keep up healthy diet and exercise

## 2012-04-18 LAB — COMPREHENSIVE METABOLIC PANEL
AST: 16 U/L (ref 0–37)
Alkaline Phosphatase: 51 U/L (ref 39–117)
BUN: 9 mg/dL (ref 6–23)
Creatinine, Ser: 0.6 mg/dL (ref 0.4–1.2)

## 2012-04-18 LAB — CBC WITH DIFFERENTIAL/PLATELET
Basophils Absolute: 0.1 10*3/uL (ref 0.0–0.1)
Eosinophils Absolute: 0.1 10*3/uL (ref 0.0–0.7)
Hemoglobin: 12.7 g/dL (ref 12.0–15.0)
Lymphocytes Relative: 27.9 % (ref 12.0–46.0)
MCHC: 33.4 g/dL (ref 30.0–36.0)
MCV: 90.4 fl (ref 78.0–100.0)
Monocytes Absolute: 0.7 10*3/uL (ref 0.1–1.0)
Neutro Abs: 4.2 10*3/uL (ref 1.4–7.7)
RDW: 14.1 % (ref 11.5–14.6)

## 2012-04-18 LAB — LIPID PANEL
Cholesterol: 174 mg/dL (ref 0–200)
LDL Cholesterol: 94 mg/dL (ref 0–99)
Triglycerides: 82 mg/dL (ref 0.0–149.0)
VLDL: 16.4 mg/dL (ref 0.0–40.0)

## 2012-04-24 ENCOUNTER — Encounter: Payer: Self-pay | Admitting: Family Medicine

## 2012-05-17 ENCOUNTER — Telehealth: Payer: Self-pay | Admitting: Family Medicine

## 2012-05-17 MED ORDER — METHYLPHENIDATE HCL ER (OSM) 27 MG PO TBCR: 27.0000 mg | EXTENDED_RELEASE_TABLET | ORAL | Status: DC

## 2012-05-17 NOTE — Telephone Encounter (Signed)
Let pt know px if up front to pick up on Friday for concerta If any side effects or problems please let me know

## 2012-05-18 NOTE — Telephone Encounter (Signed)
Left voicemail letting pt know Rx ready for pick-up tomorrow (friday)

## 2012-05-24 ENCOUNTER — Encounter: Payer: Self-pay | Admitting: Family Medicine

## 2012-05-25 ENCOUNTER — Encounter: Payer: Self-pay | Admitting: Family Medicine

## 2012-05-29 ENCOUNTER — Telehealth: Payer: Self-pay | Admitting: Family Medicine

## 2012-05-29 MED ORDER — METHYLPHENIDATE HCL ER (OSM) 27 MG PO TBCR: 27.0000 mg | EXTENDED_RELEASE_TABLET | ORAL | Status: DC

## 2012-05-29 NOTE — Telephone Encounter (Signed)
Please fax to [facility] px (I do not think they will take this electronically?)

## 2012-05-29 NOTE — Telephone Encounter (Signed)
Rx faxed to mail order pharmacy. 

## 2012-06-02 MED ORDER — METHYLPHENIDATE HCL ER (OSM) 27 MG PO TBCR: 27.0000 mg | EXTENDED_RELEASE_TABLET | ORAL | Status: DC

## 2012-06-02 NOTE — Telephone Encounter (Signed)
Can you mail the concerta to her mail order pharmacy and let her know it is done? thanks

## 2012-06-05 NOTE — Telephone Encounter (Signed)
After speaking with Dr. Milinda Antis patient was notified that she will need to pick the prescription up.

## 2012-06-06 ENCOUNTER — Other Ambulatory Visit: Payer: Self-pay | Admitting: Family Medicine

## 2012-06-06 MED ORDER — CYANOCOBALAMIN 1000 MCG/ML IJ SOLN: 1000.0000 ug | INTRAMUSCULAR | Status: DC

## 2012-06-15 ENCOUNTER — Encounter: Payer: Self-pay | Admitting: Family Medicine

## 2012-06-21 ENCOUNTER — Encounter: Payer: Self-pay | Admitting: Family Medicine

## 2012-06-21 ENCOUNTER — Telehealth: Payer: Self-pay | Admitting: Family Medicine

## 2012-06-21 NOTE — Telephone Encounter (Signed)
This will have to wait for Dr. Milinda Antis - will route to her.  I sent pt a message.

## 2012-06-21 NOTE — Telephone Encounter (Signed)
Ref to neuro 

## 2012-06-21 NOTE — Telephone Encounter (Signed)
Dr. Milinda Antis not in office, please advise

## 2012-06-22 ENCOUNTER — Encounter: Payer: Self-pay | Admitting: Family Medicine

## 2012-06-27 ENCOUNTER — Ambulatory Visit (INDEPENDENT_AMBULATORY_CARE_PROVIDER_SITE_OTHER): Payer: BC Managed Care – PPO | Admitting: Family Medicine

## 2012-06-27 ENCOUNTER — Encounter: Payer: Self-pay | Admitting: Family Medicine

## 2012-06-27 ENCOUNTER — Encounter: Payer: Self-pay | Admitting: *Deleted

## 2012-06-27 VITALS — BP 120/82 | HR 72 | Temp 98.2°F | Ht 63.5 in | Wt 162.8 lb

## 2012-06-27 DIAGNOSIS — J069 Acute upper respiratory infection, unspecified: Secondary | ICD-10-CM

## 2012-06-27 DIAGNOSIS — J309 Allergic rhinitis, unspecified: Secondary | ICD-10-CM

## 2012-06-27 MED ORDER — GUAIFENESIN-CODEINE 100-10 MG/5ML PO SYRP: 5.0000 mL | ORAL_SOLUTION | Freq: Every evening | ORAL | Status: DC | PRN

## 2012-06-27 NOTE — Assessment & Plan Note (Signed)
No sign of bacterial infection. Symptomatic care. 

## 2012-06-27 NOTE — Progress Notes (Signed)
  Subjective:    Patient ID: [name], female    DOB: [birth year], 32 y.o.   MRN: [id number]  Cough This is a new problem. The current episode started in the past 7 days (sore throat initiallyy, resolved but then2 days ago.. woke up with chest tightness, chest congestion). The problem has been gradually worsening. The problem occurs constantly. The cough is non-productive. Associated symptoms include chills, ear congestion, a fever and nasal congestion. Pertinent negatives include no chest pain or wheezing. Associated symptoms comments: 101.5  Ear fullness/ pain in right  Cough keeping her up at night.. Risk factors for lung disease include smoking/tobacco exposure (did gardening yard work day before symtpoms worsened. Former smoker). She has tried a beta-agonist inhaler (Nyquil, dayquil, zyrtec) for the symptoms. The treatment provided mild relief. There is no history of asthma.      Review of Systems  Constitutional: Positive for fever and chills.  Respiratory: Positive for cough. Negative for wheezing.   Cardiovascular: Negative for chest pain.       Objective:   Physical Exam  Constitutional: Vital signs are normal. She appears well-developed and well-nourished. She is cooperative.  Non-toxic appearance. She does not appear ill. No distress.  HENT:  Head: Normocephalic.  Right Ear: Hearing, tympanic membrane, external ear and ear canal normal. Tympanic membrane is not erythematous, not retracted and not bulging.  Left Ear: Hearing, tympanic membrane, external ear and ear canal normal. Tympanic membrane is not erythematous, not retracted and not bulging.  Nose: Mucosal edema and rhinorrhea present. Right sinus exhibits no maxillary sinus tenderness and no frontal sinus tenderness. Left sinus exhibits no maxillary sinus tenderness and no frontal sinus tenderness.  Mouth/Throat: Uvula is midline, oropharynx is clear and moist and mucous membranes are normal.  Eyes: Conjunctivae, EOM  and lids are normal. Pupils are equal, round, and reactive to light. No foreign bodies found.  Neck: Trachea normal and normal range of motion. Neck supple. Carotid bruit is not present. No mass and no thyromegaly present.  Cardiovascular: Normal rate, regular rhythm, S1 normal, S2 normal, normal heart sounds, intact distal pulses and normal pulses.  Exam reveals no gallop and no friction rub.   No murmur heard. Pulmonary/Chest: Effort normal and breath sounds normal. Not tachypneic. No respiratory distress. She has no decreased breath sounds. She has no wheezes. She has no rhonchi. She has no rales.  Neurological: She is alert.  Skin: Skin is warm, dry and intact. No rash noted.  Psychiatric: Her speech is normal and behavior is normal. Judgment normal. Her mood appears not anxious. Cognition and memory are normal. She does not exhibit a depressed mood.          Assessment & Plan:

## 2012-06-27 NOTE — Assessment & Plan Note (Signed)
Change to allegra for allergy component.

## 2012-06-27 NOTE — Patient Instructions (Addendum)
Viral infection and allergies. Allegra at night for allergy component. Can use dayquil during the day. Can use cough suppressant at noght for cough. Call if not improving gradually in next 7 days. Return to work once no fever for 24 hours.

## 2012-06-30 ENCOUNTER — Encounter: Payer: Self-pay | Admitting: Family Medicine

## 2012-06-30 ENCOUNTER — Telehealth: Payer: Self-pay | Admitting: Family Medicine

## 2012-06-30 MED ORDER — AZITHROMYCIN 250 MG PO TABS: ORAL_TABLET | ORAL | Status: DC

## 2012-06-30 NOTE — Telephone Encounter (Signed)
zpack for bronchitis that is not imp

## 2012-07-04 ENCOUNTER — Encounter: Payer: Self-pay | Admitting: Family Medicine

## 2012-07-05 ENCOUNTER — Encounter: Payer: Self-pay | Admitting: Family Medicine

## 2012-07-05 ENCOUNTER — Telehealth: Payer: Self-pay | Admitting: Family Medicine

## 2012-07-05 MED ORDER — PREDNISONE 10 MG PO TABS: ORAL_TABLET | ORAL | Status: DC

## 2012-07-05 NOTE — Telephone Encounter (Signed)
Prednisone for wheezing

## 2012-07-10 ENCOUNTER — Encounter: Payer: Self-pay | Admitting: Family Medicine

## 2012-07-18 ENCOUNTER — Encounter (HOSPITAL_BASED_OUTPATIENT_CLINIC_OR_DEPARTMENT_OTHER): Payer: Self-pay

## 2012-07-18 ENCOUNTER — Emergency Department (HOSPITAL_BASED_OUTPATIENT_CLINIC_OR_DEPARTMENT_OTHER)
Admission: EM | Admit: 2012-07-18 | Discharge: 2012-07-18 | Disposition: A | Discharge status: Home / Self Care | Payer: BC Managed Care – PPO | Attending: Emergency Medicine | Admitting: Emergency Medicine

## 2012-07-18 DIAGNOSIS — M542 Cervicalgia: Secondary | ICD-10-CM | POA: Insufficient documentation

## 2012-07-18 DIAGNOSIS — Z8679 Personal history of other diseases of the circulatory system: Secondary | ICD-10-CM | POA: Insufficient documentation

## 2012-07-18 DIAGNOSIS — Z87442 Personal history of urinary calculi: Secondary | ICD-10-CM | POA: Insufficient documentation

## 2012-07-18 DIAGNOSIS — R599 Enlarged lymph nodes, unspecified: Secondary | ICD-10-CM | POA: Insufficient documentation

## 2012-07-18 DIAGNOSIS — Z79899 Other long term (current) drug therapy: Secondary | ICD-10-CM | POA: Insufficient documentation

## 2012-07-18 DIAGNOSIS — R509 Fever, unspecified: Secondary | ICD-10-CM | POA: Insufficient documentation

## 2012-07-18 DIAGNOSIS — J3489 Other specified disorders of nose and nasal sinuses: Secondary | ICD-10-CM | POA: Insufficient documentation

## 2012-07-18 DIAGNOSIS — IMO0001 Reserved for inherently not codable concepts without codable children: Secondary | ICD-10-CM | POA: Insufficient documentation

## 2012-07-18 DIAGNOSIS — Z87891 Personal history of nicotine dependence: Secondary | ICD-10-CM | POA: Insufficient documentation

## 2012-07-18 DIAGNOSIS — Z8742 Personal history of other diseases of the female genital tract: Secondary | ICD-10-CM | POA: Insufficient documentation

## 2012-07-18 DIAGNOSIS — Z860100 Personal history of colon polyps, unspecified: Secondary | ICD-10-CM | POA: Insufficient documentation

## 2012-07-18 DIAGNOSIS — Z800 Family history of malignant neoplasm of digestive organs: Secondary | ICD-10-CM | POA: Insufficient documentation

## 2012-07-18 DIAGNOSIS — J020 Streptococcal pharyngitis: Secondary | ICD-10-CM

## 2012-07-18 DIAGNOSIS — Z8719 Personal history of other diseases of the digestive system: Secondary | ICD-10-CM | POA: Insufficient documentation

## 2012-07-18 DIAGNOSIS — Z880 Allergy status to penicillin: Secondary | ICD-10-CM | POA: Insufficient documentation

## 2012-07-18 DIAGNOSIS — F988 Other specified behavioral and emotional disorders with onset usually occurring in childhood and adolescence: Secondary | ICD-10-CM | POA: Insufficient documentation

## 2012-07-18 HISTORY — DX: Other specified behavioral and emotional disorders with onset usually occurring in childhood and adolescence: F98.8

## 2012-07-18 HISTORY — DX: Fibromyalgia: M79.7

## 2012-07-18 LAB — RAPID STREP SCREEN (MED CTR MEBANE ONLY): Streptococcus, Group A Screen (Direct): POSITIVE — AB

## 2012-07-18 MED ORDER — HYDROCODONE-ACETAMINOPHEN 7.5-325 MG/15ML PO SOLN: 15.0000 mL | Freq: Four times a day (QID) | ORAL | Status: DC | PRN

## 2012-07-18 MED ORDER — AMOXICILLIN 500 MG PO CAPS: 500.0000 mg | ORAL_CAPSULE | Freq: Three times a day (TID) | ORAL | Status: DC

## 2012-07-18 MED ORDER — DEXAMETHASONE 4 MG PO TABS
10.0000 mg | ORAL_TABLET | Freq: Once | ORAL | Status: AC
  Administered 2012-07-18: 10 mg via ORAL
  Filled 2012-07-18: qty 3

## 2012-07-18 NOTE — ED Notes (Signed)
Pt reports fever of 102 yesterday, sore throat, sweats, low back and neck pain.  Recently treated for Bronchitis.

## 2012-07-18 NOTE — ED Provider Notes (Signed)
History    CSN: [id number] Arrival date & time 07/18/12  7829  First MD Initiated Contact with Patient 07/18/12 1007     Chief Complaint  Patient presents with  . Sore Throat  . Fever  . Back Pain  . Neck Pain   (Consider location/radiation/quality/duration/timing/severity/associated sxs/prior Treatment) Patient is a 32 y.o. female presenting with pharyngitis, fever, back pain, and neck pain. The history is provided by the patient.  Sore Throat This is a recurrent problem. The current episode started yesterday. The problem occurs constantly. The problem has been gradually worsening. Pertinent negatives include no abdominal pain, no headaches and no shortness of breath. Associated symptoms comments: Myalgias with back and neck and body pain.  Fever up to 102 yesterday with sore throat.  No cough, congestion, sob but pain with swallowing. The symptoms are aggravated by swallowing. Nothing relieves the symptoms. She has tried acetaminophen for the symptoms. The treatment provided no relief.  Fever Max temp prior to arrival:  102 Temp source:  Oral Severity:  Moderate Onset quality:  Gradual Duration:  1 day Timing:  Constant Progression:  Resolved Associated symptoms: myalgias, rhinorrhea and sore throat   Associated symptoms: no congestion, no cough, no headaches, no nausea and no vomiting   Back Pain Associated symptoms: fever   Associated symptoms: no abdominal pain and no headaches   Neck Pain Associated symptoms: fever   Associated symptoms: no headaches    Past Medical History  Diagnosis Date  . GERD (gastroesophageal reflux disease)   . Endometriosis   . Fatigue   . Kidney stone   . Vitamin B12 deficiency   . Migraine   . Personal history of colonic polyps 06/01/2006    sessil serrated adenoma  . Family history of malignant neoplasm of gastrointestinal tract   . Fibromyalgia   . ADD (attention deficit disorder)    Past Surgical History  Procedure Laterality Date    . Cholecystectomy    . Ovarian cyst surgery    . Laparoscopy  11/2004    endometriosis  . Breast surgery  2004    breast reduction  . Tubal ligation  2008  . Wisdom tooth extraction      with four other teeth as well   Family History  Problem Relation Age of Onset  . Cervical cancer Mother   . Hypertension Mother   . Diabetes Mother   . Kidney cancer Mother   . Colon polyps Sister   . Kidney failure Brother   . Breast cancer Maternal Aunt   . Breast cancer Maternal Grandmother   . Colon cancer Paternal Grandfather   . Wilson's disease Father     liver transplant  . Ulcerative colitis Father   . Esophageal cancer Neg Hx   . Stomach cancer Neg Hx   . Rectal cancer Neg Hx    History  Substance Use Topics  . Smoking status: Former Smoker -- 0.25 packs/day    Quit date: 01/25/2005  . Smokeless tobacco: Not on file  . Alcohol Use: Yes     Comment: rare   OB History   Grav Para Term Preterm Abortions TAB SAB Ect Mult Living                 Review of Systems  Constitutional: Positive for fever.  HENT: Positive for sore throat, rhinorrhea and neck pain. Negative for congestion.   Respiratory: Negative for cough and shortness of breath.   Gastrointestinal: Negative for nausea, vomiting and abdominal  pain.  Musculoskeletal: Positive for myalgias and back pain.  Neurological: Negative for headaches.  All other systems reviewed and are negative.    Allergies  Strawberry; Clindamycin; Gabapentin; and Penicillins  Home Medications   Current Outpatient Rx  Name  Route  Sig  Dispense  Refill  . methylphenidate (CONCERTA) 27 MG CR tablet   Oral   Take 1 tablet (27 mg total) by mouth every morning.   90 tablet   0   . sertraline (ZOLOFT) 50 MG tablet   Oral   Take 50 mg by mouth daily.          BP 114/60  Pulse 84  Temp(Src) 98.4 F (36.9 C) (Oral)  Resp 18  Ht 5\' 4"  (1.626 m)  Wt 156 lb (70.761 kg)  BMI 26.76 kg/m2  SpO2 100% Physical Exam  Nursing  note and vitals reviewed. Constitutional: She is oriented to person, place, and time. She appears well-developed and well-nourished. No distress.  HENT:  Head: Normocephalic and atraumatic.  Right Ear: Tympanic membrane and ear canal normal.  Left Ear: Tympanic membrane and ear canal normal.  Mouth/Throat: Mucous membranes are normal. Posterior oropharyngeal edema and posterior oropharyngeal erythema present. No oropharyngeal exudate or tonsillar abscesses.  Eyes: Conjunctivae and EOM are normal. Pupils are equal, round, and reactive to light.  Neck: Normal range of motion. Neck supple. Muscular tenderness present. No tracheal deviation present. No Brudzinski's sign and no Kernig's sign noted.    Cardiovascular: Normal rate, regular rhythm and intact distal pulses.   No murmur heard. Pulmonary/Chest: Effort normal and breath sounds normal. No stridor. No respiratory distress. She has no wheezes. She has no rales.  Abdominal: Soft. She exhibits no distension. There is no tenderness. There is no rebound and no guarding.  Musculoskeletal: Normal range of motion. She exhibits no edema and no tenderness.       Lumbar back: She exhibits pain. She exhibits no bony tenderness.       Back:  Lymphadenopathy:    She has cervical adenopathy.  Neurological: She is alert and oriented to person, place, and time.  Skin: Skin is warm and dry. No rash noted. No erythema.  Psychiatric: She has a normal mood and affect. Her behavior is normal.    ED Course  Procedures (including critical care time) Labs Reviewed  RAPID STREP SCREEN - Abnormal; Notable for the following:    Streptococcus, Group A Screen (Direct) POSITIVE (*)    All other components within normal limits   No results found. 1. Strep pharyngitis     MDM    Patient here with complaint of sore throat that started yesterday and fever to 102. Denies any cough, congestion, shortness of breath prescribed her. States similar symptoms over 2  weeks ago was diagnosed with bronchitis and given a Z-Pak. Symptoms have been improved until yesterday. Patient has generalized pharyngeal erythema but no tonsillar exudates. Symptoms concerning for epiglottitis, retropharyngeal abscess or peritonsillar abscess.  Patient is nontoxic appearing but does have cervical adenopathy clear breath sounds. Vital signs are within normal limits hearing no fever however patient states she had taken Tylenol and ibuprofen prior to arrival.  Rapid strep pending.  Pt positive for strep.  Treated with amox/decadron and pain control.  Gwyneth Sprout, MD 07/18/12 1320

## 2012-08-08 ENCOUNTER — Encounter: Payer: Self-pay | Admitting: Family Medicine

## 2012-08-18 ENCOUNTER — Ambulatory Visit (INDEPENDENT_AMBULATORY_CARE_PROVIDER_SITE_OTHER): Payer: BC Managed Care – PPO | Admitting: Family Medicine

## 2012-08-18 ENCOUNTER — Encounter: Payer: Self-pay | Admitting: Family Medicine

## 2012-08-18 VITALS — BP 122/62 | HR 84 | Temp 98.8°F | Ht 63.5 in | Wt 164.5 lb

## 2012-08-18 DIAGNOSIS — N809 Endometriosis, unspecified: Secondary | ICD-10-CM

## 2012-08-18 DIAGNOSIS — R5383 Other fatigue: Secondary | ICD-10-CM

## 2012-08-18 DIAGNOSIS — R5381 Other malaise: Secondary | ICD-10-CM | POA: Insufficient documentation

## 2012-08-18 NOTE — Patient Instructions (Addendum)
Labs today for thyroid function If all normal we may consider a referral to endocrinologist in the future  We will refer you to [facility] for gyn care also

## 2012-08-18 NOTE — Progress Notes (Signed)
Subjective:    Patient ID: [name], female    DOB: 1980/02/08, 32 y.o.   MRN: [id number]  HPI Pt is here to disc poss of a thyroid problem  Lab Results  Component Value Date   TSH 1.90 04/17/2012   however she has had many symptoms ongoing for years that concern her for underactive thyroid including: Sleeplessness and fatigue  Generalized weakness Headaches Cold intol/ chills  Extreme dry skin and cracked lips and itchy ears  Constipated most of the time (this week loose stools however)- is trying to eat healthier  Gradual wt gain  Aches and pains Dizziness Easy bruising Hair thinning Sweating more  Poor memory General hormone fluctuation  She still thinks she is sensitive to gluten - ha and bloating-she avoids Exercises 4 times per week   She is having periods regularly -every month  [facility] They are a bit heavier and more painful  Has had a hard time with endometriosis   Patient Active Problem List   Diagnosis Date Noted  . Other malaise and fatigue 08/18/2012  . Viral URI with cough 06/27/2012  . Poor concentration 02/29/2012  . BPPV (benign paroxysmal positional vertigo) 05/13/2011  . Fibromyalgia 03/26/2011  . Myofascial pain 03/19/2011  . Gluten intolerance 01/22/2011  . Left leg pain 12/24/2010  . Obesity 11/09/2010  . Anemia 11/09/2010  . Routine general medical examination at a health care facility 10/28/2010  . Hypokalemia 05/18/2010  . Fatigue 05/18/2010  . Amenorrhea 05/18/2010  . APHTHOUS ULCERS 03/27/2010  . FIBROCYSTIC BREAST DISEASE 03/27/2010  . CAVERNOUS HEMANGIOMA, LIVER 12/03/2009  . Diarrhea 12/03/2009  . PERSONAL HISTORY OF COLONIC POLYPS 12/03/2009  . ALLERGIC RHINITIS 03/09/2008  . URINARY FREQUENCY, CHRONIC 01/31/2007  . VITAMIN B12 DEFICIENCY 10/28/2006  . GERD 04/27/2006  . DYSPEPSIA 04/27/2006  . ENDOMETRIOSIS 04/27/2006  . RENAL CALCULUS, HX OF 04/27/2006  . MIGRAINES, HX OF  04/27/2006   Past Medical History  Diagnosis Date  . GERD (gastroesophageal reflux disease)   . Endometriosis   . Fatigue   . Kidney stone   . Vitamin B12 deficiency   . Migraine   . Personal history of colonic polyps 06/01/2006    sessil serrated adenoma  . Family history of malignant neoplasm of gastrointestinal tract   . Fibromyalgia   . ADD (attention deficit disorder)    Past Surgical History  Procedure Laterality Date  . Cholecystectomy    . Ovarian cyst surgery    . Laparoscopy  11/2004    endometriosis  . Breast surgery  2004    breast reduction  . Tubal ligation  2008  . Wisdom tooth extraction      with four other teeth as well   History  Substance Use Topics  . Smoking status: Former Smoker -- 0.25 packs/day    Quit date: 01/25/2005  . Smokeless tobacco: Not on file  . Alcohol Use: Yes     Comment: rare   Family History  Problem Relation Age of Onset  . Cervical cancer Mother   . Hypertension Mother   . Diabetes Mother   . Kidney cancer Mother   . Colon polyps Sister   . Kidney failure Brother   . Breast cancer Maternal Aunt   . Breast cancer Maternal Grandmother   . Colon cancer Paternal Grandfather   . Wilson's disease Father     liver transplant  . Ulcerative colitis Father   . Esophageal cancer  Neg Hx   . Stomach cancer Neg Hx   . Rectal cancer Neg Hx    Allergies  Allergen Reactions  . Strawberry Anaphylaxis and Hives  . Clindamycin     REACTION: hives, angioedema  . Gabapentin     Dizzy/ worse headache  . Penicillins     REACTION: hives, angioedema   Current Outpatient Prescriptions on File Prior to Visit  Medication Sig Dispense Refill  . methylphenidate (CONCERTA) 27 MG CR tablet Take 1 tablet (27 mg total) by mouth every morning.  90 tablet  0  . sertraline (ZOLOFT) 50 MG tablet Take 50 mg by mouth daily.       No current facility-administered medications on file prior to visit.    Review of Systems Review of Systems   Constitutional: Negative for fever, appetite change, and unexpected weight change. pos for cold intol Eyes: Negative for pain and visual disturbance.  Respiratory: Negative for cough and shortness of breath.   Cardiovascular: Negative for cp or palpitations   neg for PND or orthopnea or pedal edema Gastrointestinal: Negative for nausea, diarrhea and abd pain or blood in stool  Genitourinary: Negative for urgency and frequency. pos for pelvic pain/ neg for blood in urine Skin: Negative for pallor or rash  pos for dry skin  Neurological: Negative for weakness, light-headedness, numbness and pos for ha MSK pos for generalized aches and pains and neg for joint swelling  Hematological: Negative for adenopathy. Does not bruise/bleed easily.  Psychiatric/Behavioral: Negative for dysphoric mood. The patient is not nervous/anxious.  -no stressors currently       Objective:   Physical Exam  Constitutional: She appears well-developed and well-nourished. No distress.  overwt and well app  HENT:  Head: Normocephalic and atraumatic.  Mouth/Throat: Oropharynx is clear and moist.  Eyes: Conjunctivae and EOM are normal. Pupils are equal, round, and reactive to light. Right eye exhibits no discharge. Left eye exhibits no discharge. No scleral icterus.  Neck: Trachea normal and normal range of motion. Neck supple. No JVD present. No tracheal tenderness present. Carotid bruit is not present. No tracheal deviation present. No mass and no thyromegaly present.  Cardiovascular: Normal rate, regular rhythm and normal heart sounds.  Exam reveals no gallop.   Pulmonary/Chest: Effort normal and breath sounds normal. No respiratory distress. She has no wheezes. She has no rales.  Abdominal: Soft. Bowel sounds are normal. She exhibits no distension and no mass. There is no tenderness.  Musculoskeletal: She exhibits no edema and no tenderness.  Lymphadenopathy:    She has no cervical adenopathy.  Neurological: She  is alert. She has normal reflexes. No cranial nerve deficit. She exhibits normal muscle tone. Coordination normal.  Skin: Skin is warm and dry. No rash noted. No erythema. No pallor.  Psychiatric: She has a normal mood and affect.          Assessment & Plan:

## 2012-08-19 LAB — TSH: TSH: 1.19 u[IU]/mL (ref 0.350–4.500)

## 2012-08-20 NOTE — Assessment & Plan Note (Signed)
Pt needs ongoing care as well as a yearly exam - so will ref to gyn

## 2012-08-20 NOTE — Assessment & Plan Note (Signed)
Thyroid profile today and if neg may consider endocrine referral as pt has many concerns about her symptoms Reassuring exam

## 2012-08-21 ENCOUNTER — Encounter: Payer: Self-pay | Admitting: Family Medicine

## 2012-08-21 ENCOUNTER — Telehealth: Payer: Self-pay | Admitting: Family Medicine

## 2012-08-21 DIAGNOSIS — R5381 Other malaise: Secondary | ICD-10-CM

## 2012-08-21 DIAGNOSIS — E669 Obesity, unspecified: Secondary | ICD-10-CM

## 2012-08-21 NOTE — Telephone Encounter (Signed)
Ref to endo 

## 2012-08-24 ENCOUNTER — Ambulatory Visit (INDEPENDENT_AMBULATORY_CARE_PROVIDER_SITE_OTHER): Payer: BC Managed Care – PPO | Admitting: Internal Medicine

## 2012-08-24 ENCOUNTER — Encounter: Payer: Self-pay | Admitting: Internal Medicine

## 2012-08-24 VITALS — BP 118/62 | HR 106 | Temp 98.7°F | Resp 10 | Ht 63.5 in | Wt 163.0 lb

## 2012-08-24 DIAGNOSIS — E669 Obesity, unspecified: Secondary | ICD-10-CM

## 2012-08-24 DIAGNOSIS — R5383 Other fatigue: Secondary | ICD-10-CM

## 2012-08-24 DIAGNOSIS — R5381 Other malaise: Secondary | ICD-10-CM

## 2012-08-24 NOTE — Patient Instructions (Signed)
Please return in day 3 of your menstrual cycle for labs - try to come in am, around 8-9. We will schedule a new appointment if labs are abnormal. I will send you the labs through MyChart.

## 2012-08-24 NOTE — Progress Notes (Addendum)
Subjective:     Patient ID: [name], female   DOB: July 04, 1980, 32 y.o.   MRN: [id number]  HPI [name] is a 32 y/o woman, referred by Dr Milinda Antis for evaluation for overweight and fatigue.   Pt c/o: - weight gain: 2009 131 lbs >> 160's now despite improving diet and intensifying exercise.  - sweating increased x 7 mo, despite changed deodorants - hot sweats at night - more forgetful - insomnia - on Melatonin - helps - easy bruising  - muscle aches (mainly legs >> especially at night >> mostly upper thighs) - painful menstruations - has some acne x 2 years - with menstrual cycles, mainly on face - more hair on chin and sideburns - has to shave every day - darker on legs - now dry skin  (new) - anxiety, more irritable, more sensitive - h/o constipation, but in last month, diarrhea every times she eat now (yellow)  - colonoscopy 11/2011: polyps - has HA almost daily, not as her previous migraines - no dizziness, but feels weakness - has heat intolerance - no palpitations - no tremors - no h/o infertility - 2 children: at 62 y/o and preterm labor with son - upon questioning >> a little breast discharge - no stretch marks  She gets cysts on her ovaries (also her sister and mother). She had her tubes tied.   She exercises 4-5 times a week - cardio and resistance training, even boot camps, yoga, and tries to eat healthier.  - Breakfast: eggsw, one cup of coffee, bacon or cereals - Lunch: [place], grilled chicken, or salad - Dinner: meat + 2 veggies, bread - Snacks: 2 a day, yogurt or fruit  PMH: I reviewed her chart, and she also has a history of fibromyalgia-seen by Dr. Kellie Simmering, who started her on Flexeril (patient is not taking this now) he suggested that her fibromyalgia symptoms might be related to her thyroid. Of note, her TSH on 01/2012 was 2.4, and a more recent one on 08/18/2012 was 1.19, also normal.  Pt also has a history of GERD, vitamin B12 deficiency - last B12  level normal, 423, on 03/2012., h/o endometriosis and  Amenorrhea - but monthly menstrual cycles, history of kidney stones, fibrocystic breast disease, BPPV, gluten intolerance-with normal celiac workup on 01/2011), liver cavernous hemangioma, questionable IBS, anemia-with last CBC with differential normal on 03/2012), left leg pain, ADHD-with inconclusive tests with questionable malingering her last notes from neurology-on methylphenidate started 03/2012, also on Zoloft, with ongoing concentration issues. Her last CMP was normal on 03/2012, last set of lipids were 174/82/64/94.   Review of Systems Constitutional: please see history of present illness Eyes: no blurry vision, no xerophthalmia ENT: no sore throat, no nodules palpated in throat, + dysphagia/no odynophagia, no hoarseness; + ringing in her years Cardiovascular: no CP/SOB/palpitations/leg swelling Respiratory: no cough/SOB Gastrointestinal: + N/no V/+ D/+ C, + heartburn Musculoskeletal: muscle aches Skin: easy bruising, itching, stretch marks-white, or loss Neurological: no tremors/numbness/tingling/dizziness, + HAs Psychiatric: no depression/anxiety Low libido, breast discharge, menstrual cycle problems ( see history of present illness)  Past Surgical History  Procedure Laterality Date  . Cholecystectomy    . Ovarian cyst surgery    . Laparoscopy  11/2004    endometriosis  . Breast surgery  2004    breast reduction  . Tubal ligation  2008  . Wisdom tooth extraction      with four other teeth as well   History   Social History  .  Marital Status: Married    Spouse Name: N/A    Number of Children: 2   Occupational History  . accounting    Social History Main Topics  . Smoking status: Former Smoker -- 0.25 packs/day    Quit date: 01/25/2005  . Smokeless tobacco: Not on file  . Alcohol Use: Yes     Comment: rare  . Drug Use: No  . Sexually Active: Yes    Birth Control/ Protection: Surgical     Comment: tubal  ligation   Current Outpatient Prescriptions on File Prior to Visit  Medication Sig Dispense Refill  . cyanocobalamin (,VITAMIN B-12,) 1000 MCG/ML injection Inject 1 mL into the muscle every 30 (thirty) days.      . methylphenidate (CONCERTA) 27 MG CR tablet Take 1 tablet (27 mg total) by mouth every morning.  90 tablet  0  . sertraline (ZOLOFT) 50 MG tablet Take 50 mg by mouth daily.       No current facility-administered medications on file prior to visit.   Allergies  Allergen Reactions  . Strawberry Anaphylaxis and Hives  . Clindamycin     REACTION: hives, angioedema  . Gabapentin     Dizzy/ worse headache  . Penicillins     REACTION: hives, angioedema   Family History  Problem Relation Age of Onset  . Cervical cancer Mother   . Hypertension Mother   . Diabetes Mother   . Kidney cancer Mother   . Colon polyps Sister   . Kidney failure Brother   . Breast cancer Maternal Aunt   . Breast cancer Maternal Grandmother   . Colon cancer Paternal Grandfather   . Wilson's disease Father     liver transplant  . Ulcerative colitis Father   . Esophageal cancer Neg Hx   . Stomach cancer Neg Hx   . Rectal cancer Neg Hx    Objective:   Physical Exam BP 118/62  Pulse 106  Temp(Src) 98.7 F (37.1 C) (Oral)  Resp 10  Ht 5' 3.5" (1.613 m)  Wt 163 lb (73.936 kg)  BMI 28.42 kg/m2  SpO2 98%  LMP 07/25/2012 - per patient, LMP was 07/31/2012 Wt Readings from Last 3 Encounters:  08/24/12 163 lb (73.936 kg)  08/18/12 164 lb 8 oz (74.617 kg)  07/18/12 156 lb (70.761 kg)   Constitutional: overweight, in NAD Eyes: PERRLA, EOMI, no exophthalmos ENT: moist mucous membranes, no thyromegaly, no cervical lymphadenopathy Cardiovascular: RRR, No MRG Respiratory: CTA B Gastrointestinal: abdomen soft, NT, ND, BS+ Musculoskeletal: no deformities, strength intact in all 4 Skin: moist, warm, no skin tags, very faint acanthosis nigricans on neck, vellum on sideburns, no acne visible, faint  hairs on chin Neurological: no tremor with outstretched hands, DTR normal in all 4     Assessment:     1. Overweight  2. Fatigue     Plan:     Pt with difficulty losing weight despite increasing exercise and dieting, and also with the various other symptoms including fatigue, problems sleeping, but also acne, increased hair on face and increase airsickness on body, heavy menstrual cycles, although not irregular. - we discussed about possible etiologies, which include PCOS, for which I would like to test her. We also want to make sure that she does not have hyperprolactinemia or [place]-CAH. Since she is now close to ovulation, would wait until she is in the third day of her menstrual cycle (early follicular phase), and she will return for labs. I would add a hemoglobin A1c,  see she has family history of diabetes in her grandmother. - I discussed with her that, if she does have PCOS, the treatment for this addresses the individual problems rather than curing the condition. Since she is not too bothered by her hirsutism and acne, most likely will need to address her weight. We can try oral contraceptives, although I would probably be biased towards metformin, also depending on her hemoglobin A1c. She mentioned that she has a history of ovarian cysts, however I reviewed the last 2 transvaginal ultrasounds reports from 2011, and there was just one cyst in one of the ultrasounds. - I will have the patient return and discuss her results if these are abnormal. If these are normal, we would need to find another etiology for her problems losing weight. - She asks me whether I think that she might have a cortisol problem, however, I have low suspicion for [place] sd.   Office Visit on 08/24/2012  Component Date Value Range Status  . Estradiol, Free 08/31/2012 0.52   Final   Comment: FEMALE REFERENCE RANGES FOR ESTRADIOL, FREE:                            Follicular Stage:  0.43-5.03 pg/mL                             Mid-cycle Stage:   0.72-5.89 pg/mL                            Luteal Stage:      0.40-5.55 pg/mL                            Postmenopausal:    < or = 0.38 pg/mL  . Estradiol 08/31/2012 40   Final   Comment: FEMALE REFERENCE RANGES FOR ESTRADIOL:                            Follicular Stage:  39-375 pg/mL                            Mid-cycle Stage:   94-762 pg/mL                            Luteal Stage:      48-440 pg/mL                            Postmenopausal:    < or = 10 pg/mL  . Results Received 08/31/2012 09/08/12   Final   Comment: Reference lab accession: [id number]                          Test performed by:                                     [facility]                                     [phone] [email or web address]  [address] [place], [place] [address]                                     Phone:  [phone]                          Director:  Pat Patrick, M.D.  . Testosterone 08/31/2012 45  10 - 70 ng/dL Final   Comment:           Tanner Stage       Female              Female                                        I              < 30 ng/dL        < 10 ng/dL                                        II             < 150 ng/dL       < 30 ng/dL                                        III            100-320 ng/dL     < 35 ng/dL                                        IV             200-970 ng/dL     91-47 ng/dL                                        V/Adult        300-890 ng/dL     82-95 ng/dL                             . Sex Hormone Binding 08/31/2012 114  18 - 114 nmol/L Final  . Testosterone, Free 08/31/2012 3.3  0.6 - 6.8 pg/mL Final   Comment:                            The concentration of free testosterone is derived from a mathematical                          expression based on constants for the binding of testosterone to sex  hormone-binding globulin and albumin.  . Testosterone-% Freee. 08/31/2012  0.7  0.4 - 2.4 % Final  . Androstenedione 08/31/2012 96   Final   Comment:                            Adult Female Reference Ranges for                            Androstenedione, Serum:                                                        Follicular Phase:      35-250 ng/dL                             Luteal Phase:          30-235 ng/dL                             Postmenopausal Phase:  20-75  ng/dL  . 17-OH-Progesterone, LC/Susan/Susan 08/31/2012 25   Final   Comment:                            Adult Female Reference Ranges for                            17-Hydroxyprogesterone, LC/Susan/Susan:                                                        Follicular Phase:       < or = 185 ng/dL                             Luteal Phase:           < or = 285 ng/dL                             Postmenopausal Phase:   < or = 45 ng/dL                             Pregnancy:                             First Trimester:  78-457 ng/dL                             Second Trimester: 90-357 ng/dL                             Third Trimester:  144-578 ng/dL  . LH 08/31/2012 5.48   Final   Comment: Female Reference Range:20-70 yrs     1.5-9.3 mIU/mL>70 yrs  3.1-35.6 mIU/mLFemale Reference Range:Follicular Phase     1.9-12.5 mIU/mLMidcycle             8.7-76.3 mIU/mLLuteal Phase         0.5-16.9 mIU/mL  Post Menopausal      15.9-54.0                           mIU/mLPregnant             <1.5 mIU/mLContraceptives       0.7-5.6 mIU/mL  . [facility] 08/31/2012 9.7   Final   Female Reference Range:  1.4-18.1 mIU/mLFemale Reference Range:Follicular Phase          2.5-10.2 mIU/mLMidCycle Peak          3.4-33.4 mIU/mLLuteal Phase          1.5-9.1 mIU/mLPost Menopausal     23.0-116.3 mIU/mLPregnant          <0.3 mIU/mL  . Prolactin 08/31/2012 14.3   Final   Comment:      Reference Ranges:                                           Female:                       2.1 -  17.1 ng/ml                                           Female:   Pregnant           9.7 - 208.5 ng/mL                                                     Non Pregnant      2.8 -  29.2 ng/mL                                                     Post Menopausal   1.8 -  20.3 ng/mL                                              . Preg, Serum 08/31/2012 NEG   Final  . Hemoglobin A1C 08/31/2012 5.1  4.6 - 6.5 % Final   Glycemic Control Guidelines for People with Diabetes:Non Diabetic:  <6%Goal of Therapy: <7%Additional Action Suggested:  >8%    No apparent PCOS, hyperprolactinemia, [place]-CAH,diabetes or prediabetes. Will let patient know.

## 2012-08-29 ENCOUNTER — Encounter: Payer: Self-pay | Admitting: Family Medicine

## 2012-08-31 ENCOUNTER — Other Ambulatory Visit: Payer: BC Managed Care – PPO

## 2012-08-31 LAB — FOLLICLE STIMULATING HORMONE: FSH: 9.7 m[IU]/mL

## 2012-08-31 LAB — HEMOGLOBIN A1C: Hgb A1c MFr Bld: 5.1 % (ref 4.6–6.5)

## 2012-09-01 LAB — PROLACTIN: Prolactin: 14.3 ng/mL

## 2012-09-01 LAB — TESTOSTERONE, FREE, TOTAL, SHBG
Testosterone-% Free: 0.7 % (ref 0.4–2.4)
Testosterone: 45 ng/dL (ref 10–70)

## 2012-09-04 ENCOUNTER — Encounter: Payer: Self-pay | Admitting: Internal Medicine

## 2012-09-05 ENCOUNTER — Other Ambulatory Visit: Payer: Self-pay | Admitting: Obstetrics and Gynecology

## 2012-09-08 LAB — ESTRADIOL, FREE: Estradiol, Free: 0.52 pg/mL

## 2012-09-12 ENCOUNTER — Encounter: Payer: Self-pay | Admitting: Family Medicine

## 2012-09-15 ENCOUNTER — Telehealth: Payer: Self-pay | Admitting: Family Medicine

## 2012-09-15 MED ORDER — LISDEXAMFETAMINE DIMESYLATE 30 MG PO CAPS: 30.0000 mg | ORAL_CAPSULE | ORAL | Status: DC

## 2012-09-15 NOTE — Telephone Encounter (Signed)
Changing to vyvanse- please put up front for pick up

## 2012-09-15 NOTE — Telephone Encounter (Signed)
Left voicemail letting pt know Rx ready for pick up 

## 2012-10-02 ENCOUNTER — Telehealth: Payer: Self-pay

## 2012-10-02 NOTE — Telephone Encounter (Signed)
[facility] script left v/m with ref # [id number] to c/b to clarify info. Spoke withVicky Kellogg with [facility] script who has Dr Milinda Antis as retired. Advised no Dr Milinda Antis is still working. Clarified NPI and DEA # and [facility] script will process Vyvanse prescription.

## 2012-10-10 ENCOUNTER — Other Ambulatory Visit: Payer: Self-pay | Admitting: Family Medicine

## 2012-10-10 MED ORDER — CYANOCOBALAMIN 1000 MCG/ML IJ SOLN: 1000.0000 ug | INTRAMUSCULAR | Status: DC

## 2012-10-17 ENCOUNTER — Encounter: Payer: Self-pay | Admitting: Family Medicine

## 2012-10-18 ENCOUNTER — Telehealth: Payer: Self-pay | Admitting: Family Medicine

## 2012-10-18 DIAGNOSIS — R109 Unspecified abdominal pain: Secondary | ICD-10-CM | POA: Insufficient documentation

## 2012-10-18 DIAGNOSIS — R140 Abdominal distension (gaseous): Secondary | ICD-10-CM | POA: Insufficient documentation

## 2012-10-18 NOTE — Telephone Encounter (Signed)
Ref to GI

## 2012-10-20 ENCOUNTER — Telehealth: Payer: Self-pay | Admitting: Family Medicine

## 2012-10-20 NOTE — Telephone Encounter (Signed)
An ultrasound is not going to tell us much unless we think it is a gallbladder issue or liver issue (does not sound like it )- I'm thinking more in terms of colon issue-which is why I went with GI ref

## 2012-10-20 NOTE — Telephone Encounter (Signed)
Spoke with pt today to schedule GI referral.  [name] is starting a new job on 10/23/12 and does not want to ask off work to see a GI specialist.   However, she would like to get an abdominal US instead.  States she has not had BM since 10/13/12 and has been taking stool softeners.  Best number to reach pt is [phone] / lt

## 2012-10-23 NOTE — Telephone Encounter (Signed)
Left voicemail requesting pt to call office 

## 2012-10-27 NOTE — Telephone Encounter (Signed)
Left voicemail letting pt know Dr. Royden Purl comments, (per pt request)

## 2012-11-17 ENCOUNTER — Telehealth: Payer: Self-pay | Admitting: Family Medicine

## 2012-11-17 NOTE — Telephone Encounter (Signed)
[name]- she wants the GI ref now -- requests an early am appt (via mychart)- do you need a new ref or does the old one stand ?

## 2012-11-22 ENCOUNTER — Other Ambulatory Visit: Payer: Self-pay | Admitting: Family Medicine

## 2012-11-23 NOTE — Telephone Encounter (Signed)
Left voicemail requesting pt to call office 

## 2012-11-23 NOTE — Telephone Encounter (Signed)
I am so sorry- but the legal policy for lost px is no replacements- she will have to wait until her time for refill is due again

## 2012-11-23 NOTE — Telephone Encounter (Signed)
DPR gives permission to leave messages so left voicemail letting pt know we can't re-prescribe her vyvanse early and she will have to wait until next refill is due, Rx declined

## 2012-11-24 ENCOUNTER — Ambulatory Visit: Payer: Managed Care, Other (non HMO) | Admitting: Physician Assistant

## 2012-11-24 ENCOUNTER — Other Ambulatory Visit: Payer: Self-pay | Admitting: Family Medicine

## 2012-11-24 NOTE — Telephone Encounter (Signed)
Pt has already requested Rx and Dr. Milinda Antis declined due to being to early, Sent pt mychart message letting her know

## 2012-11-30 ENCOUNTER — Other Ambulatory Visit: Payer: Self-pay

## 2012-12-15 ENCOUNTER — Other Ambulatory Visit: Payer: Self-pay | Admitting: Family Medicine

## 2012-12-15 MED ORDER — LISDEXAMFETAMINE DIMESYLATE 30 MG PO CAPS: 30.0000 mg | ORAL_CAPSULE | ORAL | Status: DC

## 2012-12-15 NOTE — Telephone Encounter (Signed)
Px printed for pick up in IN box  

## 2012-12-15 NOTE — Telephone Encounter (Signed)
Pt emailed me asking for vyvanse in 30 instead of 90 day supply  I will shred the 90 day px and print a 30 day one  In IN box

## 2012-12-18 NOTE — Telephone Encounter (Signed)
Pt notified Rx ready for pickup 

## 2012-12-19 ENCOUNTER — Encounter: Payer: Self-pay | Admitting: Family Medicine

## 2012-12-24 ENCOUNTER — Encounter: Payer: Self-pay | Admitting: Family Medicine

## 2012-12-25 ENCOUNTER — Other Ambulatory Visit: Payer: Self-pay | Admitting: *Deleted

## 2012-12-25 ENCOUNTER — Encounter: Payer: Self-pay | Admitting: Family Medicine

## 2012-12-26 ENCOUNTER — Telehealth: Payer: Self-pay | Admitting: *Deleted

## 2012-12-26 NOTE — Telephone Encounter (Signed)
Have her check and see what her insurance does cover for ADD and I will pick something for her

## 2012-12-26 NOTE — Telephone Encounter (Signed)
Received fax that Vyvanse prior Berkley Harvey was denied by insurance. Pt has not been notified yet of denial. Please advise, per mychart e-mail pt is out of medication for a few days.

## 2012-12-28 NOTE — Telephone Encounter (Signed)
Called pt but no answer and no voicemail set up but I did respond to her mychart message regarding the PA

## 2012-12-29 ENCOUNTER — Other Ambulatory Visit: Payer: Self-pay | Admitting: Family Medicine

## 2012-12-29 MED ORDER — AMPHETAMINE-DEXTROAMPHETAMINE 20 MG PO TABS: ORAL_TABLET | ORAL | Status: DC

## 2012-12-29 NOTE — Telephone Encounter (Signed)
I wrote for short acting adderall  Px printed for pick up in IN box  Please check and see if we have any discount cards for vyvanse- I do not think I have seen any

## 2013-01-01 ENCOUNTER — Encounter: Payer: Self-pay | Admitting: Family Medicine

## 2013-01-02 NOTE — Telephone Encounter (Signed)
Responded to pt through mychart 

## 2013-01-02 NOTE — Telephone Encounter (Signed)
This encounter was created in error - please disregard.

## 2013-01-02 NOTE — Telephone Encounter (Signed)
Pt advise Rx ready for pick-up, we didn't have any discount cards for vyvanse

## 2013-01-03 ENCOUNTER — Encounter: Payer: Self-pay | Admitting: Family Medicine

## 2013-01-03 ENCOUNTER — Ambulatory Visit (INDEPENDENT_AMBULATORY_CARE_PROVIDER_SITE_OTHER): Payer: 59 | Admitting: Family Medicine

## 2013-01-03 VITALS — BP 90/70 | HR 74 | Temp 98.1°F | Ht 63.5 in | Wt 174.5 lb

## 2013-01-03 DIAGNOSIS — K5900 Constipation, unspecified: Secondary | ICD-10-CM | POA: Insufficient documentation

## 2013-01-03 DIAGNOSIS — N809 Endometriosis, unspecified: Secondary | ICD-10-CM

## 2013-01-03 DIAGNOSIS — N920 Excessive and frequent menstruation with regular cycle: Secondary | ICD-10-CM

## 2013-01-03 MED ORDER — NORGESTIMATE-ETH ESTRADIOL 0.25-35 MG-MCG PO TABS: 1.0000 | ORAL_TABLET | Freq: Every day | ORAL | Status: DC

## 2013-01-03 NOTE — Assessment & Plan Note (Signed)
Pending records from UC last pm- pt states her abd xray showed diffuse stool and exam is consistent with that as well  Disc plan for acute on chronic constipation with daily miralax and also fluids/ stool softener as needed - until regular bms are restored  Update if not starting to improve in a week or if worsening

## 2013-01-03 NOTE — Patient Instructions (Signed)
Stop up front for referral to gyn  Start the ortho cylen tonight  For constipation - drink lots of fluids (water)  And get miralax powder over the counter - mix with water and take one dose daily - stay with it until you have regular bowel movements  Stool softener is still ok as well

## 2013-01-03 NOTE — Assessment & Plan Note (Signed)
Pelvic pain / irreg menses and heavy period presently  Suspect this is the cause  Adv to start ortho cyclen today (she had neg upreg today)  Update if no relief in the next few days from cramping and bleeding - and ref to her gyn at [facility] for asap appt as well

## 2013-01-03 NOTE — Progress Notes (Signed)
Subjective:    Patient ID: [name], female    DOB: Jul 24, 1980, 32 y.o.   MRN: [id number]  HPI Here for several symptoms  Pain in R pelvic area - for several days -- tried ultracet from gyn and did not help at all Then noticed some blood in her urine  Later that am started a very heavy period and also clots  Her abd feels swollen and she feels a bit dizzy and a lot of cramping    LMP was 2 weeks ago  Usually her menses are light 1-3 d with minimal symptoms  Upreg today is neg  She went to urgent care last night ([address])- and her bp was low  Did xray of abd (did not have [place] machine)- and it showed constipation - a lot of stool Taking stool softeners , and was inst to get miralax and a laxative  Painful to have BM - may be due to her period  Did urinalysis- was ok - but sent for cx   Symptoms are worse if she eats She is still following a low gluten diet   Has had a BTL   Has her last gyn visit was in aug - all was ok - with pap  Saw Dr Elvera Lennox - and tested for PCOS that were all normal    Patient Active Problem List   Diagnosis Date Noted  . Heavy menses 01/03/2013  . Constipation, acute 01/03/2013  . Abdominal bloating 10/18/2012  . Abdominal cramping 10/18/2012  . Other malaise and fatigue 08/18/2012  . Viral URI with cough 06/27/2012  . ADD (attention deficit disorder) 02/29/2012  . BPPV (benign paroxysmal positional vertigo) 05/13/2011  . Fibromyalgia 03/26/2011  . Myofascial pain 03/19/2011  . Gluten intolerance 01/22/2011  . Left leg pain 12/24/2010  . Obesity 11/09/2010  . Anemia 11/09/2010  . Routine general medical examination at a health care facility 10/28/2010  . Hypokalemia 05/18/2010  . Fatigue 05/18/2010  . Amenorrhea 05/18/2010  . APHTHOUS ULCERS 03/27/2010  . FIBROCYSTIC BREAST DISEASE 03/27/2010  . CAVERNOUS HEMANGIOMA, LIVER 12/03/2009  . Diarrhea 12/03/2009  . PERSONAL HISTORY OF COLONIC POLYPS 12/03/2009  . ALLERGIC RHINITIS  03/09/2008  . URINARY FREQUENCY, CHRONIC 01/31/2007  . VITAMIN B12 DEFICIENCY 10/28/2006  . GERD 04/27/2006  . DYSPEPSIA 04/27/2006  . ENDOMETRIOSIS 04/27/2006  . RENAL CALCULUS, HX OF 04/27/2006  . MIGRAINES, HX OF 04/27/2006   Past Medical History  Diagnosis Date  . GERD (gastroesophageal reflux disease)   . Endometriosis   . Fatigue   . Kidney stone   . Vitamin B12 deficiency   . Migraine   . Personal history of colonic polyps 06/01/2006    sessil serrated adenoma  . Family history of malignant neoplasm of gastrointestinal tract   . Fibromyalgia   . ADD (attention deficit disorder)    Past Surgical History  Procedure Laterality Date  . Cholecystectomy    . Ovarian cyst surgery    . Laparoscopy  11/2004    endometriosis  . Breast surgery  2004    breast reduction  . Tubal ligation  2008  . Wisdom tooth extraction      with four other teeth as well   History  Substance Use Topics  . Smoking status: Former Smoker -- 0.25 packs/day    Quit date: 01/25/2005  . Smokeless tobacco: Never Used  . Alcohol Use: Yes     Comment: rare   Family History  Problem Relation Age of  Onset  . Cervical cancer Mother   . Hypertension Mother   . Diabetes Mother   . Kidney cancer Mother   . Colon polyps Sister   . Kidney failure Brother   . Breast cancer Maternal Aunt   . Breast cancer Maternal Grandmother   . Colon cancer Paternal Grandfather   . Wilson's disease Father     liver transplant  . Ulcerative colitis Father   . Esophageal cancer Neg Hx   . Stomach cancer Neg Hx   . Rectal cancer Neg Hx    Allergies  Allergen Reactions  . Strawberry Anaphylaxis and Hives  . Clindamycin     REACTION: hives, angioedema  . Gabapentin     Dizzy/ worse headache  . Penicillins     REACTION: hives, angioedema   Current Outpatient Prescriptions on File Prior to Visit  Medication Sig Dispense Refill  . amphetamine-dextroamphetamine (ADDERALL) 20 MG tablet Take 1 by mouth in am and  1 at lunch time  60 tablet  0  . cyanocobalamin (,VITAMIN B-12,) 1000 MCG/ML injection Inject 1 mL (1,000 mcg total) into the muscle every 30 (thirty) days.  1 mL  5   No current facility-administered medications on file prior to visit.    Review of Systems Review of Systems  Constitutional: Negative for fever, appetite change,  and unexpected weight change. pos for fatigue  Eyes: Negative for pain and visual disturbance.  Respiratory: Negative for cough and shortness of breath.   Cardiovascular: Negative for cp or palpitations    Gastrointestinal: Negative for nausea, diarrhea and pos for constipation/ worse than normal pos for abd bloating  Genitourinary: Negative for urgency and frequency. neg for dysuria  Skin: Negative for pallor or rash   Neurological: Negative for weakness, light-headedness, numbness and pos for a headache today Hematological: Negative for adenopathy. Does not bruise/bleed easily.  Psychiatric/Behavioral: Negative for dysphoric mood. The patient is  nervous/anxious.  pos for poor sleep        Objective:   Physical Exam  Constitutional: She appears well-developed and well-nourished. No distress.  overwt and well appearing   HENT:  Head: Normocephalic and atraumatic.  Mouth/Throat: Oropharynx is clear and moist.  Eyes: Conjunctivae and EOM are normal. Pupils are equal, round, and reactive to light.  No conj pallor   Neck: Normal range of motion. Neck supple.  Cardiovascular: Normal rate, regular rhythm and intact distal pulses.  Exam reveals no gallop.   No murmur heard. Pulmonary/Chest: Effort normal and breath sounds normal. No respiratory distress. She has no wheezes.  Abdominal: Soft. Bowel sounds are normal. She exhibits no mass. There is tenderness. There is no rebound and no guarding.  abd is bloated with nl bs in 4 Q Tender in epigastrium and suprapubic area  No rebound or guarding No cva tenderness   Musculoskeletal: She exhibits no edema.    Lymphadenopathy:    She has no cervical adenopathy.  Neurological: She is alert. She has normal reflexes. No cranial nerve deficit. She exhibits normal muscle tone. Coordination normal.  Skin: Skin is warm and dry. No rash noted. No erythema.  Psychiatric: Her speech is normal and behavior is normal. Her mood appears anxious. Her affect is not blunt.          Assessment & Plan:

## 2013-01-03 NOTE — Progress Notes (Signed)
Pre-visit discussion using our clinic review tool. No additional management support is needed unless otherwise documented below in the visit note.  

## 2013-01-03 NOTE — Assessment & Plan Note (Signed)
Suspect this may be related to her endometriosis To start ortho cyclen tonight  Ref to gyn  Update if menses does not stop in several days

## 2013-01-09 ENCOUNTER — Telehealth: Payer: Self-pay | Admitting: Family Medicine

## 2013-01-09 ENCOUNTER — Encounter: Payer: Self-pay | Admitting: Family Medicine

## 2013-01-09 DIAGNOSIS — R102 Pelvic and perineal pain: Secondary | ICD-10-CM

## 2013-01-09 DIAGNOSIS — N920 Excessive and frequent menstruation with regular cycle: Secondary | ICD-10-CM

## 2013-01-09 DIAGNOSIS — G8929 Other chronic pain: Secondary | ICD-10-CM | POA: Insufficient documentation

## 2013-01-09 NOTE — Telephone Encounter (Signed)
Please call pt to schedule this - thanks

## 2013-01-11 ENCOUNTER — Ambulatory Visit (HOSPITAL_BASED_OUTPATIENT_CLINIC_OR_DEPARTMENT_OTHER): Payer: 59

## 2013-01-12 ENCOUNTER — Ambulatory Visit (HOSPITAL_BASED_OUTPATIENT_CLINIC_OR_DEPARTMENT_OTHER): Payer: Self-pay

## 2013-01-15 ENCOUNTER — Ambulatory Visit (HOSPITAL_BASED_OUTPATIENT_CLINIC_OR_DEPARTMENT_OTHER): Payer: 59 | Attending: Family Medicine

## 2013-01-15 ENCOUNTER — Ambulatory Visit (HOSPITAL_BASED_OUTPATIENT_CLINIC_OR_DEPARTMENT_OTHER): Payer: 59

## 2013-01-29 ENCOUNTER — Telehealth: Payer: Self-pay | Admitting: Family Medicine

## 2013-01-29 ENCOUNTER — Encounter: Payer: Self-pay | Admitting: Family Medicine

## 2013-01-29 DIAGNOSIS — L299 Pruritus, unspecified: Secondary | ICD-10-CM

## 2013-01-29 NOTE — Telephone Encounter (Signed)
Pt req ENT ref for ear itching

## 2013-02-15 ENCOUNTER — Other Ambulatory Visit: Payer: Self-pay | Admitting: Family Medicine

## 2013-02-16 MED ORDER — AMPHETAMINE-DEXTROAMPHETAMINE 20 MG PO TABS: ORAL_TABLET | ORAL | Status: DC

## 2013-02-16 NOTE — Telephone Encounter (Signed)
Left voicemail letting pt know Rx ready for pick up 

## 2013-02-16 NOTE — Telephone Encounter (Signed)
Px printed for pick up in IN box  

## 2013-02-23 ENCOUNTER — Ambulatory Visit: Payer: 59 | Admitting: Family Medicine

## 2013-03-06 ENCOUNTER — Emergency Department (HOSPITAL_BASED_OUTPATIENT_CLINIC_OR_DEPARTMENT_OTHER)
Admission: EM | Admit: 2013-03-06 | Discharge: 2013-03-06 | Disposition: A | Discharge status: Home / Self Care | Payer: 59 | Attending: Emergency Medicine | Admitting: Emergency Medicine

## 2013-03-06 ENCOUNTER — Encounter (HOSPITAL_BASED_OUTPATIENT_CLINIC_OR_DEPARTMENT_OTHER): Payer: Self-pay | Admitting: Emergency Medicine

## 2013-03-06 ENCOUNTER — Emergency Department (HOSPITAL_BASED_OUTPATIENT_CLINIC_OR_DEPARTMENT_OTHER): Payer: 59

## 2013-03-06 DIAGNOSIS — R630 Anorexia: Secondary | ICD-10-CM | POA: Insufficient documentation

## 2013-03-06 DIAGNOSIS — Z3202 Encounter for pregnancy test, result negative: Secondary | ICD-10-CM | POA: Insufficient documentation

## 2013-03-06 DIAGNOSIS — Z8679 Personal history of other diseases of the circulatory system: Secondary | ICD-10-CM | POA: Insufficient documentation

## 2013-03-06 DIAGNOSIS — R1031 Right lower quadrant pain: Secondary | ICD-10-CM | POA: Insufficient documentation

## 2013-03-06 DIAGNOSIS — R109 Unspecified abdominal pain: Secondary | ICD-10-CM

## 2013-03-06 DIAGNOSIS — Z79899 Other long term (current) drug therapy: Secondary | ICD-10-CM | POA: Insufficient documentation

## 2013-03-06 DIAGNOSIS — F988 Other specified behavioral and emotional disorders with onset usually occurring in childhood and adolescence: Secondary | ICD-10-CM | POA: Insufficient documentation

## 2013-03-06 DIAGNOSIS — Z8742 Personal history of other diseases of the female genital tract: Secondary | ICD-10-CM | POA: Insufficient documentation

## 2013-03-06 DIAGNOSIS — Z860100 Personal history of colon polyps, unspecified: Secondary | ICD-10-CM | POA: Insufficient documentation

## 2013-03-06 DIAGNOSIS — Z8739 Personal history of other diseases of the musculoskeletal system and connective tissue: Secondary | ICD-10-CM | POA: Insufficient documentation

## 2013-03-06 DIAGNOSIS — A499 Bacterial infection, unspecified: Secondary | ICD-10-CM | POA: Insufficient documentation

## 2013-03-06 DIAGNOSIS — R110 Nausea: Secondary | ICD-10-CM | POA: Insufficient documentation

## 2013-03-06 DIAGNOSIS — Z8719 Personal history of other diseases of the digestive system: Secondary | ICD-10-CM | POA: Insufficient documentation

## 2013-03-06 DIAGNOSIS — E538 Deficiency of other specified B group vitamins: Secondary | ICD-10-CM | POA: Insufficient documentation

## 2013-03-06 DIAGNOSIS — N760 Acute vaginitis: Secondary | ICD-10-CM

## 2013-03-06 DIAGNOSIS — B9689 Other specified bacterial agents as the cause of diseases classified elsewhere: Secondary | ICD-10-CM | POA: Insufficient documentation

## 2013-03-06 DIAGNOSIS — Z87891 Personal history of nicotine dependence: Secondary | ICD-10-CM | POA: Insufficient documentation

## 2013-03-06 DIAGNOSIS — Z880 Allergy status to penicillin: Secondary | ICD-10-CM | POA: Insufficient documentation

## 2013-03-06 DIAGNOSIS — Z87442 Personal history of urinary calculi: Secondary | ICD-10-CM | POA: Insufficient documentation

## 2013-03-06 LAB — URINALYSIS, ROUTINE W REFLEX MICROSCOPIC
Bilirubin Urine: NEGATIVE
GLUCOSE, UA: NEGATIVE mg/dL
Hgb urine dipstick: NEGATIVE
Ketones, ur: NEGATIVE mg/dL
LEUKOCYTES UA: NEGATIVE
Nitrite: NEGATIVE
PH: 7 (ref 5.0–8.0)
Protein, ur: NEGATIVE mg/dL
SPECIFIC GRAVITY, URINE: 1.008 (ref 1.005–1.030)
Urobilinogen, UA: 0.2 mg/dL (ref 0.0–1.0)

## 2013-03-06 LAB — COMPREHENSIVE METABOLIC PANEL
ALT: 9 U/L (ref 0–35)
AST: 13 U/L (ref 0–37)
Albumin: 4.3 g/dL (ref 3.5–5.2)
Alkaline Phosphatase: 61 U/L (ref 39–117)
BUN: 6 mg/dL (ref 6–23)
CO2: 25 meq/L (ref 19–32)
Calcium: 9.8 mg/dL (ref 8.4–10.5)
Chloride: 102 mEq/L (ref 96–112)
Creatinine, Ser: 0.6 mg/dL (ref 0.50–1.10)
GLUCOSE: 98 mg/dL (ref 70–99)
Potassium: 3.5 mEq/L — ABNORMAL LOW (ref 3.7–5.3)
Sodium: 142 mEq/L (ref 137–147)
TOTAL PROTEIN: 7.7 g/dL (ref 6.0–8.3)
Total Bilirubin: 0.6 mg/dL (ref 0.3–1.2)

## 2013-03-06 LAB — WET PREP, GENITAL
Trich, Wet Prep: NONE SEEN
YEAST WET PREP: NONE SEEN

## 2013-03-06 LAB — CBC WITH DIFFERENTIAL/PLATELET
Basophils Absolute: 0 10*3/uL (ref 0.0–0.1)
Basophils Relative: 0 % (ref 0–1)
EOS ABS: 0.1 10*3/uL (ref 0.0–0.7)
EOS PCT: 1 % (ref 0–5)
HCT: 39.8 % (ref 36.0–46.0)
HEMOGLOBIN: 13.3 g/dL (ref 12.0–15.0)
LYMPHS ABS: 2.3 10*3/uL (ref 0.7–4.0)
LYMPHS PCT: 26 % (ref 12–46)
MCH: 30.3 pg (ref 26.0–34.0)
MCHC: 33.4 g/dL (ref 30.0–36.0)
MCV: 90.7 fL (ref 78.0–100.0)
MONOS PCT: 8 % (ref 3–12)
Monocytes Absolute: 0.7 10*3/uL (ref 0.1–1.0)
Neutro Abs: 5.8 10*3/uL (ref 1.7–7.7)
Neutrophils Relative %: 65 % (ref 43–77)
Platelets: 244 10*3/uL (ref 150–400)
RBC: 4.39 MIL/uL (ref 3.87–5.11)
RDW: 12.7 % (ref 11.5–15.5)
WBC: 9 10*3/uL (ref 4.0–10.5)

## 2013-03-06 LAB — PREGNANCY, URINE: PREG TEST UR: NEGATIVE

## 2013-03-06 LAB — LIPASE, BLOOD: LIPASE: 26 U/L (ref 11–59)

## 2013-03-06 MED ORDER — IOHEXOL 300 MG/ML  SOLN
50.0000 mL | Freq: Once | INTRAMUSCULAR | Status: AC | PRN
  Administered 2013-03-06: 50 mL via ORAL

## 2013-03-06 MED ORDER — SODIUM CHLORIDE 0.9 % IV BOLUS (SEPSIS)
1000.0000 mL | Freq: Once | INTRAVENOUS | Status: AC
  Administered 2013-03-06: 1000 mL via INTRAVENOUS

## 2013-03-06 MED ORDER — PROMETHAZINE HCL 25 MG PO TABS: 25.0000 mg | ORAL_TABLET | Freq: Four times a day (QID) | ORAL | Status: DC | PRN

## 2013-03-06 MED ORDER — METRONIDAZOLE 500 MG PO TABS: 500.0000 mg | ORAL_TABLET | Freq: Two times a day (BID) | ORAL | Status: DC

## 2013-03-06 MED ORDER — IOHEXOL 300 MG/ML  SOLN
100.0000 mL | Freq: Once | INTRAMUSCULAR | Status: AC | PRN
  Administered 2013-03-06: 100 mL via INTRAVENOUS

## 2013-03-06 MED ORDER — HYDROMORPHONE HCL PF 1 MG/ML IJ SOLN
0.5000 mg | Freq: Once | INTRAMUSCULAR | Status: AC
Start: 2013-03-06 — End: 2013-03-06
  Administered 2013-03-06: 0.5 mg via INTRAVENOUS
  Filled 2013-03-06: qty 1

## 2013-03-06 MED ORDER — ONDANSETRON HCL 4 MG/2ML IJ SOLN
4.0000 mg | Freq: Once | INTRAMUSCULAR | Status: AC
  Administered 2013-03-06: 4 mg via INTRAVENOUS
  Filled 2013-03-06: qty 2

## 2013-03-06 MED ORDER — HYDROCODONE-ACETAMINOPHEN 5-325 MG PO TABS: 2.0000 | ORAL_TABLET | ORAL | Status: DC | PRN

## 2013-03-06 NOTE — ED Notes (Signed)
Pt c/o lower abd pain x 2 days nausea only yesterday diarrhea only

## 2013-03-06 NOTE — ED Provider Notes (Signed)
CSN: [id number]     Arrival date & time 03/06/13  1721 History   First MD Initiated Contact with Patient 03/06/13 1731     Chief Complaint  Patient presents with  . Abdominal Pain     (Consider location/radiation/quality/duration/timing/severity/associated sxs/prior Treatment) HPI Comments: Patient is 33 year old female with history of GERD, endometriosis, Kidney stone and fibromyalgia who presents to the ED with 2 day history of RLQ abdominal pain.  She states that the pain has been episodic yesterday but constant today.  She reports diarrhea yesterday but none today and nausea through the past 2 days without vomiting.  She has been able to keep down po fluids but has not really wanted food.  She denies fever, chills, chest pain, shortness of breath, dysuria, hematuria, vaginal discharge or bleeding.  She reports her LMP was last week - states that her endometriosis pain usually coincides with her menstrual cycle.  Patient is a 33 y.o. female presenting with abdominal pain. The history is provided by the patient. No language interpreter was used.  Abdominal Pain Pain location:  RLQ Pain quality: sharp and shooting   Pain radiates to:  Does not radiate Pain severity:  Moderate Onset quality:  Gradual Duration:  2 days Timing:  Constant Progression:  Worsening Chronicity:  New Context: not alcohol use, not diet changes, not eating, not laxative use, not medication withdrawal, not previous surgeries, not recent travel, not sick contacts and not suspicious food intake   Relieved by:  Nothing Worsened by:  Nothing tried Ineffective treatments:  None tried Associated symptoms: anorexia and nausea   Associated symptoms: no chills, no constipation, no diarrhea, no dysuria, no fever, no hematuria, no shortness of breath, no vaginal bleeding, no vaginal discharge and no vomiting     Past Medical History  Diagnosis Date  . GERD (gastroesophageal reflux disease)   . Endometriosis   . Fatigue    . Kidney stone   . Vitamin B12 deficiency   . Migraine   . Personal history of colonic polyps 06/01/2006    sessil serrated adenoma  . Family history of malignant neoplasm of gastrointestinal tract   . Fibromyalgia   . ADD (attention deficit disorder)    Past Surgical History  Procedure Laterality Date  . Cholecystectomy    . Ovarian cyst surgery    . Laparoscopy  11/2004    endometriosis  . Breast surgery  2004    breast reduction  . Tubal ligation  2008  . Wisdom tooth extraction      with four other teeth as well   Family History  Problem Relation Age of Onset  . Cervical cancer Mother   . Hypertension Mother   . Diabetes Mother   . Kidney cancer Mother   . Colon polyps Sister   . Kidney failure Brother   . Breast cancer Maternal Aunt   . Breast cancer Maternal Grandmother   . Colon cancer Paternal Grandfather   . Wilson's disease Father     liver transplant  . Ulcerative colitis Father   . Esophageal cancer Neg Hx   . Stomach cancer Neg Hx   . Rectal cancer Neg Hx    History  Substance Use Topics  . Smoking status: Former Smoker -- 0.25 packs/day    Quit date: 01/25/2005  . Smokeless tobacco: Never Used  . Alcohol Use: Yes     Comment: rare   OB History   Grav Para Term Preterm Abortions TAB SAB Ect Mult Living  Review of Systems  Constitutional: Negative for fever and chills.  Respiratory: Negative for shortness of breath.   Gastrointestinal: Positive for nausea, abdominal pain and anorexia. Negative for vomiting, diarrhea and constipation.  Genitourinary: Negative for dysuria, hematuria, vaginal bleeding and vaginal discharge.  All other systems reviewed and are negative.      Allergies  Strawberry; Clindamycin; Gabapentin; and Penicillins  Home Medications   Current Outpatient Rx  Name  Route  Sig  Dispense  Refill  . amphetamine-dextroamphetamine (ADDERALL) 20 MG tablet      Take 1 by mouth in am and 1 at lunch time   60  tablet   0   . cyanocobalamin (,VITAMIN B-12,) 1000 MCG/ML injection   Intramuscular   Inject 1 mL (1,000 mcg total) into the muscle every 30 (thirty) days.   1 mL   5   . norgestimate-ethinyl estradiol (ORTHO-CYCLEN,SPRINTEC,PREVIFEM) 0.25-35 MG-MCG tablet   Oral   Take 1 tablet by mouth daily.   1 Package   1    BP 116/66  Pulse 74  Temp(Src) 98.2 F (36.8 C) (Oral)  Resp 18  Ht 5\' 3"  (1.6 m)  Wt 161 lb (73.029 kg)  BMI 28.53 kg/m2  SpO2 99%  LMP 02/27/2013 Physical Exam  Nursing note and vitals reviewed. Constitutional: She is oriented to person, place, and time. She appears well-developed and well-nourished. No distress.  HENT:  Head: Normocephalic and atraumatic.  Right Ear: External ear normal.  Left Ear: External ear normal.  Nose: Nose normal.  Mouth/Throat: Oropharynx is clear and moist. No oropharyngeal exudate.  Eyes: Conjunctivae are normal. Pupils are equal, round, and reactive to light. No scleral icterus.  Neck: Normal range of motion. Neck supple.  Cardiovascular: Normal rate, regular rhythm and normal heart sounds.  Exam reveals no gallop and no friction rub.   No murmur heard. Pulmonary/Chest: Effort normal and breath sounds normal. No respiratory distress. She has no wheezes. She has no rales. She exhibits no tenderness.  Abdominal: Soft. Bowel sounds are normal. She exhibits no distension and no mass. There is tenderness. There is guarding and tenderness at McBurney's point. There is no rebound and no CVA tenderness.    Genitourinary: There is no rash or tenderness on the right labia. There is no rash or tenderness on the left labia. Uterus is tender. Uterus is not enlarged. Cervix exhibits no motion tenderness and no discharge. Right adnexum displays tenderness. Right adnexum displays no mass and no fullness. Left adnexum displays no mass and no tenderness. No tenderness or bleeding around the vagina. No vaginal discharge found.  Lymphadenopathy:     She has no cervical adenopathy.  Neurological: She is alert and oriented to person, place, and time. She exhibits normal muscle tone. Coordination normal.  Skin: Skin is warm and dry. No rash noted. No erythema. No pallor.  Psychiatric: She has a normal mood and affect. Her behavior is normal. Judgment and thought content normal.    ED Course  Procedures (including critical care time) Labs Review Labs Reviewed  WET PREP, GENITAL - Abnormal; Notable for the following:    Clue Cells Wet Prep HPF POC FEW (*)    WBC, Wet Prep HPF POC FEW (*)    All other components within normal limits  COMPREHENSIVE METABOLIC PANEL - Abnormal; Notable for the following:    Potassium 3.5 (*)    All other components within normal limits  GC/CHLAMYDIA PROBE AMP  URINALYSIS, ROUTINE W REFLEX MICROSCOPIC  PREGNANCY, URINE  CBC WITH DIFFERENTIAL  LIPASE, BLOOD   Imaging Review Ct Abdomen Pelvis W Contrast  03/06/2013   CLINICAL DATA:  Right lower abdomen pain, nausea, vomiting  EXAM: CT ABDOMEN AND PELVIS WITH CONTRAST  TECHNIQUE: Multidetector CT imaging of the abdomen and pelvis was performed using the standard protocol following bolus administration of intravenous contrast.  CONTRAST:  147mL OMNIPAQUE IOHEXOL 300 MG/ML  SOLN  COMPARISON:  Jun 05, 2010, November 15, 2009  FINDINGS: There are few low-density lesions within the liver, largest in the posterior segment right lobe liver measuring 1.5 cm. These are probably cysts. Patient is status post prior cholecystectomy. The spleen, pancreas, adrenal glands and kidneys are normal. There is no hydronephrosis bilaterally. The aorta is normal. There is no abdominal lymphadenopathy. There is no small bowel obstruction or diverticulitis. The appendix is normal. Bowel content is noted throughout colon.  Fluid-filled bladder is normal. The uterus is normal. There is a small cyst in normal size right ovary. Minimal dependent atelectasis of the posterior lung bases are noted.  No acute abnormalities identified in the visualized bones.  IMPRESSION: No acute abnormality identified in the abdomen and pelvis. The appendix is normal. There is no bowel obstruction. Constipation.   Electronically Signed   By: Abelardo Diesel M.D.   On: 03/06/2013 19:59    EKG Interpretation   None       MDM   Abdominal pain Constipation  Patient here with 2 day history of nausea and abdominal pain.  Labs normal, + clue cells, CT is normal, small cyst in the right ovary but nothing to explain the pain besides the constipation.  Will discharge home with nausea medication and she will follow up with her PCP.    Idalia Needle Joelyn Oms, [place] 03/06/13 2108

## 2013-03-06 NOTE — Discharge Instructions (Signed)
Abdominal Pain, Women °Abdominal (stomach, pelvic, or belly) pain can be caused by many things. It is important to tell your doctor: °· The location of the pain. °· Does it come and go or is it present all the time? °· Are there things that start the pain (eating certain foods, exercise)? °· Are there other symptoms associated with the pain (fever, nausea, vomiting, diarrhea)? °All of this is helpful to know when trying to find the cause of the pain. °CAUSES  °· Stomach: virus or bacteria infection, or ulcer. °· Intestine: appendicitis (inflamed appendix), regional ileitis (Crohn's disease), ulcerative colitis (inflamed colon), irritable bowel syndrome, diverticulitis (inflamed diverticulum of the colon), or cancer of the stomach or intestine. °· Gallbladder disease or stones in the gallbladder. °· Kidney disease, kidney stones, or infection. °· Pancreas infection or cancer. °· Fibromyalgia (pain disorder). °· Diseases of the female organs: °· Uterus: fibroid (non-cancerous) tumors or infection. °· Fallopian tubes: infection or tubal pregnancy. °· Ovary: cysts or tumors. °· Pelvic adhesions (scar tissue). °· Endometriosis (uterus lining tissue growing in the pelvis and on the pelvic organs). °· Pelvic congestion syndrome (female organs filling up with blood just before the menstrual period). °· Pain with the menstrual period. °· Pain with ovulation (producing an egg). °· Pain with an IUD (intrauterine device, birth control) in the uterus. °· Cancer of the female organs. °· Functional pain (pain not caused by a disease, may improve without treatment). °· Psychological pain. °· Depression. °DIAGNOSIS  °Your doctor will decide the seriousness of your pain by doing an examination. °· Blood tests. °· X-rays. °· Ultrasound. °· CT scan (computed tomography, special type of X-ray). °· MRI (magnetic resonance imaging). °· Cultures, for infection. °· Barium enema (dye inserted in the large intestine, to better view it with  X-rays). °· Colonoscopy (looking in intestine with a lighted tube). °· Laparoscopy (minor surgery, looking in abdomen with a lighted tube). °· Major abdominal exploratory surgery (looking in abdomen with a large incision). °TREATMENT  °The treatment will depend on the cause of the pain.  °· Many cases can be observed and treated at home. °· Over-the-counter medicines recommended by your caregiver. °· Prescription medicine. °· Antibiotics, for infection. °· Birth control pills, for painful periods or for ovulation pain. °· Hormone treatment, for endometriosis. °· Nerve blocking injections. °· Physical therapy. °· Antidepressants. °· Counseling with a psychologist or psychiatrist. °· Minor or major surgery. °HOME CARE INSTRUCTIONS  °· Do not take laxatives, unless directed by your caregiver. °· Take over-the-counter pain medicine only if ordered by your caregiver. Do not take aspirin because it can cause an upset stomach or bleeding. °· Try a clear liquid diet (broth or water) as ordered by your caregiver. Slowly move to a bland diet, as tolerated, if the pain is related to the stomach or intestine. °· Have a thermometer and take your temperature several times a day, and record it. °· Bed rest and sleep, if it helps the pain. °· Avoid sexual intercourse, if it causes pain. °· Avoid stressful situations. °· Keep your follow-up appointments and tests, as your caregiver orders. °· If the pain does not go away with medicine or surgery, you may try: °· Acupuncture. °· Relaxation exercises (yoga, meditation). °· Group therapy. °· Counseling. °SEEK MEDICAL CARE IF:  °· You notice certain foods cause stomach pain. °· Your home care treatment is not helping your pain. °· You need stronger pain medicine. °· You want your IUD removed. °· You feel faint or   lightheaded. °· You develop nausea and vomiting. °· You develop a rash. °· You are having side effects or an allergy to your medicine. °SEEK IMMEDIATE MEDICAL CARE IF:  °· Your  pain does not go away or gets worse. °· You have a fever. °· Your pain is felt only in portions of the abdomen. The right side could possibly be appendicitis. The left lower portion of the abdomen could be colitis or diverticulitis. °· You are passing blood in your stools (bright red or black tarry stools, with or without vomiting). °· You have blood in your urine. °· You develop chills, with or without a fever. °· You pass out. °MAKE SURE YOU:  °· Understand these instructions. °· Will watch your condition. °· Will get help right away if you are not doing well or get worse. °Document Released: 11/08/2006 Document Revised: 04/05/2011 Document Reviewed: 11/28/2008 °ExitCare® Patient Information ©2014 [employer]. ° °Bacterial Vaginosis °Bacterial vaginosis is a vaginal infection that occurs when the normal balance of bacteria in the vagina is disrupted. It results from an overgrowth of certain bacteria. This is the most common vaginal infection in women of childbearing age. Treatment is important to prevent complications, especially in pregnant women, as it can cause a premature delivery. °CAUSES  °Bacterial vaginosis is caused by an increase in harmful bacteria that are normally present in smaller amounts in the vagina. Several different kinds of bacteria can cause bacterial vaginosis. However, the reason that the condition develops is not fully understood. °RISK FACTORS °Certain activities or behaviors can put you at an increased risk of developing bacterial vaginosis, including: °· Having a new sex partner or multiple sex partners. °· Douching. °· Using an intrauterine device (IUD) for contraception. °Women do not get bacterial vaginosis from toilet seats, bedding, swimming pools, or contact with objects around them. °SIGNS AND SYMPTOMS  °Some women with bacterial vaginosis have no signs or symptoms. Common symptoms include: °· Grey vaginal discharge. °· A fishlike odor with discharge, especially after sexual  intercourse. °· Itching or burning of the vagina and vulva. °· Burning or pain with urination. °DIAGNOSIS  °Your health care provider will take a medical history and examine the vagina for signs of bacterial vaginosis. A sample of vaginal fluid may be taken. Your health care provider will look at this sample under a microscope to check for bacteria and abnormal cells. A vaginal pH test may also be done.  °TREATMENT  °Bacterial vaginosis may be treated with antibiotic medicines. These may be given in the form of a pill or a vaginal cream. A second round of antibiotics may be prescribed if the condition comes back after treatment.  °HOME CARE INSTRUCTIONS  °· Only take over-the-counter or prescription medicines as directed by your health care provider. °· If antibiotic medicine was prescribed, take it as directed. Make sure you finish it even if you start to feel better. °· Do not have sex until treatment is completed. °· Tell all sexual partners that you have a vaginal infection. They should see their health care provider and be treated if they have problems, such as a mild rash or itching. °· Practice safe sex by using condoms and only having one sex partner. °SEEK MEDICAL CARE IF:  °· Your symptoms are not improving after 3 days of treatment. °· You have increased discharge or pain. °· You have a fever. °MAKE SURE YOU:  °· Understand these instructions. °· Will watch your condition. °· Will get help right away if you   are not doing well or get worse. °FOR MORE INFORMATION  °Centers for Disease Control and Prevention, Division of STD Prevention: www.cdc.gov/std °American Sexual Health Association (ASHA): www.ashastd.org  °Document Released: 01/11/2005 Document Revised: 11/01/2012 Document Reviewed: 08/23/2012 °ExitCare® Patient Information ©2014 [employer]. ° °

## 2013-03-06 NOTE — ED Provider Notes (Signed)
Medical screening examination/treatment/procedure(s) were performed by non-physician practitioner and as supervising physician I was immediately available for consultation/collaboration.  EKG Interpretation   None         Blanchie Dessert, MD 03/06/13 2239

## 2013-03-07 ENCOUNTER — Encounter: Payer: Self-pay | Admitting: Family Medicine

## 2013-03-07 ENCOUNTER — Ambulatory Visit (INDEPENDENT_AMBULATORY_CARE_PROVIDER_SITE_OTHER): Payer: 59 | Admitting: Family Medicine

## 2013-03-07 VITALS — BP 98/66 | HR 80 | Temp 98.3°F | Ht 63.5 in | Wt 164.5 lb

## 2013-03-07 DIAGNOSIS — N83202 Unspecified ovarian cyst, left side: Secondary | ICD-10-CM | POA: Insufficient documentation

## 2013-03-07 DIAGNOSIS — K219 Gastro-esophageal reflux disease without esophagitis: Secondary | ICD-10-CM

## 2013-03-07 DIAGNOSIS — N760 Acute vaginitis: Secondary | ICD-10-CM

## 2013-03-07 DIAGNOSIS — M797 Fibromyalgia: Secondary | ICD-10-CM

## 2013-03-07 DIAGNOSIS — IMO0001 Reserved for inherently not codable concepts without codable children: Secondary | ICD-10-CM

## 2013-03-07 DIAGNOSIS — B9689 Other specified bacterial agents as the cause of diseases classified elsewhere: Secondary | ICD-10-CM

## 2013-03-07 DIAGNOSIS — A499 Bacterial infection, unspecified: Secondary | ICD-10-CM

## 2013-03-07 DIAGNOSIS — N83209 Unspecified ovarian cyst, unspecified side: Secondary | ICD-10-CM

## 2013-03-07 LAB — GC/CHLAMYDIA PROBE AMP
CT Probe RNA: NEGATIVE
GC PROBE AMP APTIMA: NEGATIVE

## 2013-03-07 NOTE — Progress Notes (Signed)
Subjective:    Patient ID: [name], female    DOB: 15-Sep-1980, 33 y.o.   MRN: [id number]  HPI Here with generalized fatigue/ pain no appetite/ headache (getting better)  Some red spots on chest  Knot on back of head  Dizzy at times   Abdominal pain - went to the hospital last night  Labs  Lab Results  Component Value Date   WBC 9.0 03/06/2013   HGB 13.3 03/06/2013   HCT 39.8 03/06/2013   MCV 90.7 03/06/2013   PLT 244 03/06/2013      Chemistry      Component Value Date/Time   NA 142 03/06/2013 1823   K 3.5* 03/06/2013 1823   CL 102 03/06/2013 1823   CO2 25 03/06/2013 1823   BUN 6 03/06/2013 1823   CREATININE 0.60 03/06/2013 1823      Component Value Date/Time   CALCIUM 9.8 03/06/2013 1823   ALKPHOS 61 03/06/2013 1823   AST 13 03/06/2013 1823   ALT 9 03/06/2013 1823   BILITOT 0.6 03/06/2013 1823      Lab Results  Component Value Date   TSH 1.190 08/18/2012    CT scan abd/pelvis -unremarkable  Dx with bacterial vaginosis - has not had vag d/c (has not started the flagyl yet)  Some of her pain is suprapubic  Has endometriosis -pt states that has calmed down  Some nausea - vomiting today  Has had night time chills/ shakiness  abd pain- comes and goes  Not sleeping well -wakes up frequently  Some diarrhea today- a few loose stools  No apptite - can keep down water  A lot of burping  Had BTL- not pregnant   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 BV (bacterial vaginosis) 03/07/2013  . Other and unspecified ovarian cyst 03/07/2013  . Ear itching 01/29/2013  . Pelvic pain in female 01/09/2013  . Heavy menses 01/03/2013  . Constipation, acute 01/03/2013  . Abdominal bloating 10/18/2012  . Abdominal cramping 10/18/2012  . Other malaise  and fatigue 08/18/2012  . Viral URI with cough 06/27/2012  . ADD (attention deficit disorder) 02/29/2012  . BPPV (benign paroxysmal positional vertigo) 05/13/2011  . Fibromyalgia 03/26/2011  . Myofascial pain 03/19/2011  . Gluten intolerance 01/22/2011  . Left leg pain 12/24/2010  . Obesity 11/09/2010  . Anemia 11/09/2010  . Routine general medical examination at a health care facility 10/28/2010  . Hypokalemia 05/18/2010  . Fatigue 05/18/2010  . Amenorrhea 05/18/2010  . APHTHOUS ULCERS 03/27/2010  . FIBROCYSTIC BREAST DISEASE 03/27/2010  . CAVERNOUS HEMANGIOMA, LIVER 12/03/2009  . Diarrhea 12/03/2009  . PERSONAL HISTORY OF COLONIC POLYPS 12/03/2009  . ALLERGIC RHINITIS 03/09/2008  . URINARY FREQUENCY, CHRONIC 01/31/2007  . VITAMIN B12 DEFICIENCY 10/28/2006  . GERD 04/27/2006  . DYSPEPSIA 04/27/2006  . ENDOMETRIOSIS 04/27/2006  . RENAL CALCULUS, HX OF 04/27/2006  . MIGRAINES, HX OF 04/27/2006   Past Medical History  Diagnosis Date  . GERD (gastroesophageal reflux disease)   . Endometriosis   . Fatigue   . Kidney stone   . Vitamin B12 deficiency   . Migraine   . Personal history of colonic polyps 06/01/2006    sessil serrated adenoma  .  Family history of malignant neoplasm of gastrointestinal tract   . Fibromyalgia   . ADD (attention deficit disorder)    Past Surgical History  Procedure Laterality Date  . Cholecystectomy    . Ovarian cyst surgery    . Laparoscopy  11/2004    endometriosis  . Breast surgery  2004    breast reduction  . Tubal ligation  2008  . Wisdom tooth extraction      with four other teeth as well   History  Substance Use Topics  . Smoking status: Former Smoker -- 0.25 packs/day    Quit date: 01/25/2005  . Smokeless tobacco: Never Used  . Alcohol Use: Yes     Comment: rare   Family History  Problem Relation Age of Onset  . Cervical cancer Mother   . Hypertension Mother   . Diabetes Mother   . Kidney cancer Mother   . Colon polyps  Sister   . Kidney failure Brother   . Breast cancer Maternal Aunt   . Breast cancer Maternal Grandmother   . Colon cancer Paternal Grandfather   . Wilson's disease Father     liver transplant  . Ulcerative colitis Father   . Esophageal cancer Neg Hx   . Stomach cancer Neg Hx   . Rectal cancer Neg Hx    Allergies  Allergen Reactions  . Strawberry Anaphylaxis and Hives  . Clindamycin     REACTION: hives, angioedema  . Gabapentin     Dizzy/ worse headache  . Penicillins     REACTION: hives, angioedema   Current Outpatient Prescriptions on File Prior to Visit  Medication Sig Dispense Refill  . amphetamine-dextroamphetamine (ADDERALL) 20 MG tablet Take 1 by mouth in am and 1 at lunch time  60 tablet  0  . cyanocobalamin (,VITAMIN B-12,) 1000 MCG/ML injection Inject 1 mL (1,000 mcg total) into the muscle every 30 (thirty) days.  1 mL  5  . HYDROcodone-acetaminophen (NORCO/VICODIN) 5-325 MG per tablet Take 2 tablets by mouth every 4 (four) hours as needed.  10 tablet  0  . metroNIDAZOLE (FLAGYL) 500 MG tablet Take 1 tablet (500 mg total) by mouth 2 (two) times daily.  14 tablet  0  . promethazine (PHENERGAN) 25 MG tablet Take 1 tablet (25 mg total) by mouth every 6 (six) hours as needed for nausea or vomiting.  30 tablet  0   No current facility-administered medications on file prior to visit.    Review of Systems Review of Systems  Constitutional: Negative for unexpected wt change   Eyes: Negative for pain and visual disturbance. ENT neg for congestion or st   Respiratory: Negative for wheeze  and shortness of breath.   Cardiovascular: Negative for cp or palpitations    Gastrointestinal: Negative for blood in stool or dark stool Genitourinary: Negative for urgency and frequency. neg for dysuria or hematuria  Skin: Negative for pallor or rash  pos for red spots on trunk - for years  Neurological: Negative for numbness or unilateral weakness/ facial droop.  Hematological: Negative  for adenopathy. Does not bruise/bleed easily.  Psychiatric/Behavioral: pt denies anxiety or excessive worry.         Objective:   Physical Exam  Constitutional: She appears well-developed and well-nourished. No distress.  overwt and well app  HENT:  Head: Normocephalic and atraumatic.  Right Ear: External ear normal.  Left Ear: External ear normal.  Nose: Nose normal.  Mouth/Throat: Oropharynx is clear and moist.  No sinus or  temporal tenderness  Some eczema in ears   Eyes: Conjunctivae and EOM are normal. Pupils are equal, round, and reactive to light. Right eye exhibits no discharge. Left eye exhibits no discharge. No scleral icterus.  No nystagmus  Neck: Normal range of motion. Neck supple. No JVD present. Carotid bruit is not present. No thyromegaly present.  Cardiovascular: Normal rate, regular rhythm, normal heart sounds and intact distal pulses.  Exam reveals no gallop.   Pulmonary/Chest: Effort normal and breath sounds normal. No respiratory distress. She has no wheezes. She has no rales.  Abdominal: Soft. Bowel sounds are normal. She exhibits no distension, no abdominal bruit and no mass. There is tenderness.  Mild suprapubic tenderness  Musculoskeletal: She exhibits no edema and no tenderness.  Lymphadenopathy:    She has no cervical adenopathy.  Neurological: She is alert. She has normal reflexes. No cranial nerve deficit. She exhibits normal muscle tone. Coordination normal.  Skin: Skin is warm and dry. No rash noted. No erythema. No pallor.  Many tiny angiomas on trunk and back -baseline   Psychiatric: Her speech is normal and behavior is normal. Thought content normal. Her mood appears anxious. Her affect is blunt. Her affect is not labile. She does not exhibit a depressed mood.  Seems somewhat anxious -pt denies it  Somewhat timid and quiet           Assessment & Plan:

## 2013-03-07 NOTE — Progress Notes (Signed)
Pre-visit discussion using our clinic review tool. No additional management support is needed unless otherwise documented below in the visit note.  

## 2013-03-07 NOTE — Patient Instructions (Signed)
Or acid reflux - switch to nexium 40 mg and take it daily  You have a tiny ovarian cyst on the right- this should go away and pain will improve  Take the flagyl for bacterial vaginal infection (this should help the pelvic pain) Try the phenergan for nausea- it may also help you sleep  Follow up with me in about 2 weeks for a re check  You may also have a virus that needs to run it's course - that would get your fibromyalgia aggrivated

## 2013-03-08 NOTE — Assessment & Plan Note (Signed)
This may be flaring pelvic pain/also endometriosis  Low threshold to re image or send back to gyn if needed

## 2013-03-08 NOTE — Assessment & Plan Note (Signed)
Multiple medical c/o and reassuring exam  Other things may be triggering it - unsure  Will disc further at f/u when other problems are addressed

## 2013-03-08 NOTE — Assessment & Plan Note (Signed)
?   If cause of acute on chronic nausea  Will try nexium 40mg  -samples given  F/u planned  Has phenergan from the ER

## 2013-03-08 NOTE — Assessment & Plan Note (Signed)
Pt will take flagyl as directed from ER and f/u ER notes and studies ref in detail

## 2013-03-13 ENCOUNTER — Encounter: Payer: Self-pay | Admitting: Family Medicine

## 2013-03-14 ENCOUNTER — Encounter: Payer: Self-pay | Admitting: Family Medicine

## 2013-03-16 ENCOUNTER — Ambulatory Visit: Payer: 59 | Admitting: Family Medicine

## 2013-03-19 ENCOUNTER — Ambulatory Visit (INDEPENDENT_AMBULATORY_CARE_PROVIDER_SITE_OTHER): Payer: 59 | Admitting: Family Medicine

## 2013-03-19 ENCOUNTER — Encounter: Payer: Self-pay | Admitting: Family Medicine

## 2013-03-19 VITALS — BP 108/68 | HR 78 | Temp 98.1°F | Ht 63.5 in | Wt 166.0 lb

## 2013-03-19 DIAGNOSIS — F411 Generalized anxiety disorder: Secondary | ICD-10-CM

## 2013-03-19 DIAGNOSIS — F419 Anxiety disorder, unspecified: Secondary | ICD-10-CM

## 2013-03-19 MED ORDER — PAROXETINE HCL 10 MG PO TABS: ORAL_TABLET | ORAL | Status: DC

## 2013-03-19 NOTE — Patient Instructions (Signed)
Start paxil- and increase it to 20 mg after 2 weeks if you tolerate it well Try to get some exercise  Follow up with me in approx 4 weeks  If worse or side effects let me know

## 2013-03-19 NOTE — Progress Notes (Signed)
Pre visit review using our clinic review tool, if applicable. No additional management support is needed unless otherwise documented below in the visit note. 

## 2013-03-19 NOTE — Assessment & Plan Note (Addendum)
In pt with many physical symptoms/ fibromyalgia/ chronic pain / endometriosis Failed zoloft and cymbalta in the past  Trial of paxil 10- titrate to 20 Has had counseling  F/u planned Rev side eff incl anx or worse depression  >25 minutes spent in face to face time with patient, >50% spent in counselling or coordination of care

## 2013-03-19 NOTE — Progress Notes (Signed)
Subjective:    Patient ID: [name], female    DOB: 06-11-1980, 33 y.o.   MRN: [id number]  HPI Here for f/u   Taking nexium for stomach issues That has eliminated her issues   Is now having frequent attacks (? Panic) - with sudden need to take a deep breath/ bites the inside of her lip, heart "flutters" and stomach hurts She tries to lie down and her "mind is racing"- and then her body won't let her sleep This has happened over the past 6 mo - but much worse over the past week One episode lasted 2 hours-then jittery for the rest of the day  Many physical symptoms- chronic pain/ abd pain/ reflux   Not a lot of stress currently  Her major worries focus around deadlines - worry about forgetting things and also getting things done  She is in charge of the house - and no help at work at all   Does not feel depressed  No exercise -no time Overall very little time for herself - occ gets time to go off with girlfriends   Mother has bipolar dz  Sister has had depression in the past   Headaches are bad again    Chemistry      Component Value Date/Time   NA 142 03/06/2013 1823   K 3.5* 03/06/2013 1823   CL 102 03/06/2013 1823   CO2 25 03/06/2013 1823   BUN 6 03/06/2013 1823   CREATININE 0.60 03/06/2013 1823      Component Value Date/Time   CALCIUM 9.8 03/06/2013 1823   ALKPHOS 61 03/06/2013 1823   AST 13 03/06/2013 1823   ALT 9 03/06/2013 1823   BILITOT 0.6 03/06/2013 1823      Lab Results  Component Value Date   TSH 1.190 08/18/2012   Lab Results  Component Value Date   WBC 9.0 03/06/2013   HGB 13.3 03/06/2013   HCT 39.8 03/06/2013   MCV 90.7 03/06/2013   PLT 244 03/06/2013     In past zoloft stopped working  cymbalta made her feel like a zombie     Review of Systems Review of Systems  Constitutional: Negative for fever, appetite change, fatigue and unexpected weight change.  Eyes: Negative for pain and visual disturbance.  Respiratory: Negative for cough and  shortness of breath.   Cardiovascular: Negative for cp and pos for palpitations     Gastrointestinal: Negative for nausea, diarrhea and pos for constipation   Genitourinary: Negative for urgency and frequency. pos for chronic pelvic pain  Skin: Negative for pallor or rash   Neurological: Negative for weakness, light-headedness, numbness and headaches.  Hematological: Negative for adenopathy. Does not bruise/bleed easily.  Psychiatric/Behavioral: Negative for dysphoric mood. The patient is nervous/anxious.         Objective:   Physical Exam  Constitutional: She appears well-developed and well-nourished. No distress.  overwt and well app  HENT:  Head: Normocephalic and atraumatic.  Mouth/Throat: Oropharynx is clear and moist.  Eyes: Conjunctivae and EOM are normal. Pupils are equal, round, and reactive to light. No scleral icterus.  Neck: Normal range of motion. Neck supple. No JVD present. Carotid bruit is not present. No tracheal deviation present. No thyromegaly present.  Cardiovascular: Normal rate, regular rhythm, normal heart sounds and intact distal pulses.  Exam reveals no gallop.   Pulmonary/Chest: Effort normal and breath sounds normal. No respiratory distress. She has no wheezes. She exhibits no tenderness.  Abdominal: Soft. Bowel sounds are  normal. She exhibits no distension, no abdominal bruit and no mass. There is no tenderness. There is no rebound and no guarding.  Musculoskeletal: She exhibits no edema.  Lymphadenopathy:    She has no cervical adenopathy.  Neurological: She is alert. She has normal reflexes. No cranial nerve deficit. She exhibits normal muscle tone. Coordination normal.  Mild hand tremor  Skin: Skin is warm and dry. No rash noted. No erythema. No pallor.  Psychiatric: Her speech is normal and behavior is normal. Thought content normal. Her mood appears anxious. Her affect is not blunt and not labile. Thought content is not paranoid. She expresses no  homicidal and no suicidal ideation.  Timid and quiet           Assessment & Plan:

## 2013-05-17 ENCOUNTER — Encounter: Payer: Self-pay | Admitting: Family Medicine

## 2013-05-31 ENCOUNTER — Encounter: Payer: Self-pay | Admitting: Family Medicine

## 2013-05-31 ENCOUNTER — Ambulatory Visit (INDEPENDENT_AMBULATORY_CARE_PROVIDER_SITE_OTHER): Payer: Managed Care, Other (non HMO) | Admitting: Family Medicine

## 2013-05-31 VITALS — BP 110/70 | HR 73 | Temp 98.3°F | Ht 63.5 in | Wt 168.2 lb

## 2013-05-31 DIAGNOSIS — E538 Deficiency of other specified B group vitamins: Secondary | ICD-10-CM

## 2013-05-31 DIAGNOSIS — E669 Obesity, unspecified: Secondary | ICD-10-CM

## 2013-05-31 DIAGNOSIS — R5383 Other fatigue: Secondary | ICD-10-CM

## 2013-05-31 DIAGNOSIS — E876 Hypokalemia: Secondary | ICD-10-CM

## 2013-05-31 DIAGNOSIS — Z0000 Encounter for general adult medical examination without abnormal findings: Secondary | ICD-10-CM

## 2013-05-31 DIAGNOSIS — R5381 Other malaise: Secondary | ICD-10-CM

## 2013-05-31 NOTE — Assessment & Plan Note (Signed)
Preventative protocols reviewed and updated unless pt declined. Discussed healthy diet and lifestyle.  Lab work today.

## 2013-05-31 NOTE — Assessment & Plan Note (Addendum)
Persistent fatigue worse over last month, along with other sxs indicative of thyroid dysfunction, however prior thyroid checks have been normal. Recheck labwork today including B12, TSH, free T4/T3, and BMP. If unrevealing, consider OSA evaluation.

## 2013-05-31 NOTE — Progress Notes (Signed)
BP 110/70  Pulse 73  Temp(Src) 98.3 F (36.8 C) (Oral)  Ht 5' 3.5" (1.613 m)  Wt 168 lb 4 oz (76.318 kg)  BMI 29.33 kg/m2  SpO2 98%   CC: CPE  Subjective:    Patient ID: [name], female    DOB: 02-Jun-1980, 33 y.o.   MRN: [id number]  HPI: [name] is a 33 y.o. female presenting on 05/31/2013 for Annual Exam   Patient of Dr. Marliss Coots presents today for physical.  She made physical appointment through [facility].  Ongoing issues over last month with chills and sweats. + fatigue.  Weight gain noted. Constipation noted. Worried about thyroid.  H/o fibromyalgia.  Feels she sleeps well, wakes up feeling rested.  + daytime somnolence.  Easily falls asleep watching TV.  H/o snoring but not recently.  No apneic episodes. Lab Results  Component Value Date   TSH 1.190 08/18/2012   Vit B12 deficiency - has received injections, not since 02/2013 (neighbor RN was administering but she moved). Wt Readings from Last 3 Encounters:  05/31/13 168 lb 4 oz (76.318 kg)  03/19/13 166 lb (75.297 kg)  03/07/13 164 lb 8 oz (74.617 kg)   Body mass index is 29.33 kg/(m^2).  Preventative: Last CPE 03/2012. Well woman with Dr. Kennon Rounds on 07/2012 - normal pap and breast exam. LMP end of April.  H/o endometriosis Flu 10/2012 Td 2010 Seat belt use discussed. Sunscreen use discussed.  No suspicious moles on skin  Lives with husband and son, 1 dog Occupation: Sales promotion account executive Activity: yoga, pilates, walks 3x/wk 2 mi Diet: gluten free diet, avoids fried foods, good water 32-64 oz, fruits/vegetables daily  Relevant past medical, surgical, family and social history reviewed and updated as indicated.  Allergies and medications reviewed and updated. Current Outpatient Prescriptions on File Prior to Visit  Medication Sig  . amphetamine-dextroamphetamine (ADDERALL) 20 MG tablet Take 1 by mouth in am and 1 at lunch time  . PARoxetine (PAXIL) 10 MG tablet Take one pill by mouth daily in the evening  for 2 weeks and then increase to 2 pills by mouth daily   No current facility-administered medications on file prior to visit.    Review of Systems  Constitutional: Positive for chills, fatigue and unexpected weight change (gain). Negative for fever, activity change and appetite change.       Chills and sweats  HENT: Negative for hearing loss.   Eyes: Negative for visual disturbance.  Respiratory: Positive for chest tightness (thought related to anxiety, previously evaluated and prescribed anxiety meds but didn't try) and shortness of breath. Negative for cough and wheezing.   Cardiovascular: Negative for chest pain, palpitations and leg swelling.  Gastrointestinal: Positive for constipation. Negative for nausea, vomiting, abdominal pain, diarrhea, blood in stool and abdominal distention.  Genitourinary: Negative for hematuria and difficulty urinating.  Musculoskeletal: Negative for arthralgias, myalgias and neck pain.  Skin: Negative for rash.  Neurological: Negative for dizziness, seizures, syncope and headaches.  Hematological: Negative for adenopathy. Does not bruise/bleed easily.  Psychiatric/Behavioral: Negative for dysphoric mood (easily cries). The patient is not nervous/anxious.    Per HPI unless specifically indicated above    Objective:    BP 110/70  Pulse 73  Temp(Src) 98.3 F (36.8 C) (Oral)  Ht 5' 3.5" (1.613 m)  Wt 168 lb 4 oz (76.318 kg)  BMI 29.33 kg/m2  SpO2 98%  Physical Exam  Nursing note and vitals reviewed. Constitutional: She is oriented to person, place, and time. She  appears well-developed and well-nourished. No distress.  HENT:  Head: Normocephalic and atraumatic.  Right Ear: Hearing, tympanic membrane, external ear and ear canal normal.  Left Ear: Hearing, tympanic membrane, external ear and ear canal normal.  Nose: Nose normal.  Mouth/Throat: Uvula is midline, oropharynx is clear and moist and mucous membranes are normal. No oropharyngeal exudate,  posterior oropharyngeal edema or posterior oropharyngeal erythema.  Canals with dry skin/pruritic.  H/o ear canal eczematous dermatitis  Eyes: Conjunctivae and EOM are normal. Pupils are equal, round, and reactive to light. No scleral icterus.  Neck: Normal range of motion. Neck supple. No thyromegaly present.  Cardiovascular: Normal rate, regular rhythm, normal heart sounds and intact distal pulses.   No murmur heard. Pulses:      Radial pulses are 2+ on the right side, and 2+ on the left side.  Pulmonary/Chest: Effort normal and breath sounds normal. No respiratory distress. She has no wheezes. She has no rales.  Abdominal: Soft. Bowel sounds are normal. She exhibits no distension and no mass. There is no tenderness. There is no rebound and no guarding.  Musculoskeletal: Normal range of motion. She exhibits no edema.  Lymphadenopathy:    She has no cervical adenopathy.  Neurological: She is alert and oriented to person, place, and time.  CN grossly intact, station and gait intact  Skin: Skin is warm and dry. No rash noted.  Psychiatric: She has a normal mood and affect. Her behavior is normal. Judgment and thought content normal.       Assessment & Plan:   Problem List Items Addressed This Visit   VITAMIN B12 DEFICIENCY     Pt states has been taking B12 from B complex vitamin, not B12 shots because neighbor (prior administrator of B12 shots) moved.  Check B12 level today.    Hypokalemia   Relevant Orders      Basic metabolic panel   Fatigue     Persistent fatigue worse over last month, along with other sxs indicative of thyroid dysfunction, however prior thyroid checks have been normal. Recheck labwork today including B12, TSH, free T4/T3, and BMP. If unrevealing, consider OSA evaluation.    Relevant Orders      Basic metabolic panel      TSH      Vitamin B12      T4, Free      T3   Routine general medical examination at a health care facility - Primary     Preventative  protocols reviewed and updated unless pt declined. Discussed healthy diet and lifestyle.  Lab work today.    Obesity     Body mass index is 29.33 kg/(m^2). weight gain noted.  Encouraged continue healthy diet/lifestyle changes.        Follow up plan: Return as needed.

## 2013-05-31 NOTE — Assessment & Plan Note (Signed)
Pt states has been taking B12 from B complex vitamin, not B12 shots because neighbor (prior administrator of B12 shots) moved.  Check B12 level today.

## 2013-05-31 NOTE — Progress Notes (Signed)
Pre visit review using our clinic review tool, if applicable. No additional management support is needed unless otherwise documented below in the visit note. 

## 2013-05-31 NOTE — Assessment & Plan Note (Signed)
Body mass index is 29.33 kg/(m^2). weight gain noted.  Encouraged continue healthy diet/lifestyle changes.

## 2013-05-31 NOTE — Patient Instructions (Signed)
Blood work today (check thyroid, b12 , and sugar/kidneys) we will call you with results. Continue healthy diet and active lifestyle Return as needed or at next scheduled appointment with Dr. Glori Bickers.

## 2013-06-04 NOTE — Addendum Note (Signed)
Addended by: Ellamae Sia on: 06/04/2013 09:20 AM   Modules accepted: Orders

## 2013-08-02 ENCOUNTER — Encounter: Payer: Self-pay | Admitting: Family Medicine

## 2013-08-20 ENCOUNTER — Ambulatory Visit: Payer: Self-pay | Admitting: Family Medicine

## 2013-08-20 ENCOUNTER — Encounter: Payer: Self-pay | Admitting: Family Medicine

## 2013-09-03 ENCOUNTER — Encounter: Payer: Self-pay | Admitting: Gastroenterology

## 2013-10-12 ENCOUNTER — Encounter: Payer: Self-pay | Admitting: Family Medicine

## 2013-10-15 ENCOUNTER — Telehealth: Payer: Self-pay | Admitting: Family Medicine

## 2013-10-15 MED ORDER — CYANOCOBALAMIN 1000 MCG/ML IJ SOLN: 1000.0000 ug | INTRAMUSCULAR | Status: DC

## 2013-10-15 NOTE — Telephone Encounter (Signed)
B12 to [place] has an MA to give it to her

## 2013-10-17 ENCOUNTER — Ambulatory Visit (INDEPENDENT_AMBULATORY_CARE_PROVIDER_SITE_OTHER): Payer: Managed Care, Other (non HMO) | Admitting: Family Medicine

## 2013-10-17 ENCOUNTER — Encounter: Payer: Self-pay | Admitting: Family Medicine

## 2013-10-17 VITALS — BP 100/70 | HR 64 | Temp 98.3°F | Ht 63.5 in | Wt 174.2 lb

## 2013-10-17 DIAGNOSIS — Z0000 Encounter for general adult medical examination without abnormal findings: Secondary | ICD-10-CM

## 2013-10-17 DIAGNOSIS — E876 Hypokalemia: Secondary | ICD-10-CM

## 2013-10-17 DIAGNOSIS — R197 Diarrhea, unspecified: Secondary | ICD-10-CM

## 2013-10-17 DIAGNOSIS — R5383 Other fatigue: Secondary | ICD-10-CM

## 2013-10-17 DIAGNOSIS — R5381 Other malaise: Secondary | ICD-10-CM

## 2013-10-17 DIAGNOSIS — E669 Obesity, unspecified: Secondary | ICD-10-CM

## 2013-10-17 DIAGNOSIS — E538 Deficiency of other specified B group vitamins: Secondary | ICD-10-CM

## 2013-10-17 NOTE — Progress Notes (Signed)
Pre visit review using our clinic review tool, if applicable. No additional management support is needed unless otherwise documented below in the visit note. 

## 2013-10-17 NOTE — Assessment & Plan Note (Signed)
Pt has hx of IBS however this seems a bit different with cramping and mucous in stools Cbc today c diff test today Did inst her to take fiber daily  Avoid dairy for now

## 2013-10-17 NOTE — Patient Instructions (Signed)
Start back on your fiber regularly - every day would be optimal  Lab today  Also I am ordering a test for C difficile - a bacterial overgrowth that can cause these symptoms Continue healthy diet  Ease up on dairy if it bothers you  Update if not starting to improve in a week or if worsening

## 2013-10-17 NOTE — Progress Notes (Signed)
Subjective:    Patient ID: [name], female    DOB: February 17, 1980, 33 y.o.   MRN: [id number]  HPI Martin Majestic to UC for sinus problems on Friday   Mentioned intermittent diarrhea and constipation  Last 3 weeks she has some mild pain in lower abdomen followed by diarrhea  Bad enough to make her run to the bathroom - urgently  It will grab her at odd times  Thought she may have a virus  Mucous in stool  Smell is more foul than usual   Has not been on abx  No visits to hospital   Is exercising and eating a very healthy diet  She is still avoiding a lot of high gluten foods - but does eat oatmeal in the am  Has not eaten out  Does not think she has had food poisoning    No nausea or vomiting  Stomach feels weird   Some folks at work have been sick also    Last colonosc 1/13   She has taken a fiber supplement - fiber source 3 times per week and also flax seed oil   Patient Active Problem List   Diagnosis Date Noted  . Anxiety disorder 03/19/2013  . Other and unspecified ovarian cyst 03/07/2013  . Pelvic pain in female 01/09/2013  . Heavy menses 01/03/2013  . Constipation, acute 01/03/2013  . Other malaise and fatigue 08/18/2012  . ADD (attention deficit disorder) 02/29/2012  . Fibromyalgia 03/26/2011  . Myofascial pain 03/19/2011  . Gluten intolerance 01/22/2011  . Obesity 11/09/2010  . Routine general medical examination at a health care facility 10/28/2010  . Hypokalemia 05/18/2010  . Fatigue 05/18/2010  . Amenorrhea 05/18/2010  . APHTHOUS ULCERS 03/27/2010  . FIBROCYSTIC BREAST DISEASE 03/27/2010  . CAVERNOUS HEMANGIOMA, LIVER 12/03/2009  . Diarrhea 12/03/2009  . PERSONAL HISTORY OF COLONIC POLYPS 12/03/2009  . ALLERGIC RHINITIS 03/09/2008  . URINARY FREQUENCY, CHRONIC 01/31/2007  . VITAMIN B12 DEFICIENCY 10/28/2006  . GERD 04/27/2006  . DYSPEPSIA 04/27/2006  . ENDOMETRIOSIS 04/27/2006  . RENAL CALCULUS, HX OF 04/27/2006  . MIGRAINES, HX OF 04/27/2006    Past Medical History  Diagnosis Date  . GERD (gastroesophageal reflux disease)   . Endometriosis   . Fatigue   . Kidney stone   . Vitamin B12 deficiency   . Migraine   . Personal history of colonic polyps 06/01/2006    sessil serrated adenoma  . Family history of malignant neoplasm of gastrointestinal tract   . Fibromyalgia   . ADD (attention deficit disorder)    Past Surgical History  Procedure Laterality Date  . Cholecystectomy    . Ovarian cyst surgery    . Laparoscopy  11/2004    endometriosis  . Breast surgery  2004    breast reduction  . Tubal ligation  2008  . Wisdom tooth extraction      with four other teeth as well   History  Substance Use Topics  . Smoking status: Former Smoker -- 0.25 packs/day    Quit date: 01/25/2005  . Smokeless tobacco: Never Used  . Alcohol Use: Yes     Comment: rare   Family History  Problem Relation Age of Onset  . Cervical cancer Mother   . Hypertension Mother   . Diabetes Mother   . Kidney cancer Mother   . Colon polyps Sister   . Kidney failure Brother   . Breast cancer Maternal Aunt   . Breast cancer Maternal Grandmother   . Colon  cancer Paternal Grandfather   . Wilson's disease Father     liver transplant  . Ulcerative colitis Father   . Esophageal cancer Neg Hx   . Stomach cancer Neg Hx   . Rectal cancer Neg Hx    Allergies  Allergen Reactions  . Strawberry Anaphylaxis and Hives  . Clindamycin     REACTION: hives, angioedema  . Gabapentin     Dizzy/ worse headache  . Penicillins     REACTION: hives, angioedema   Current Outpatient Prescriptions on File Prior to Visit  Medication Sig Dispense Refill  . cyanocobalamin (,VITAMIN B-12,) 1000 MCG/ML injection Inject 1 mL (1,000 mcg total) into the muscle every 30 (thirty) days.  1 mL  11   No current facility-administered medications on file prior to visit.      Review of Systems Review of Systems  Constitutional: Negative for fever, appetite change,  fatigue and unexpected weight change.  Eyes: Negative for pain and visual disturbance.  Respiratory: Negative for cough and shortness of breath.   Cardiovascular: Negative for cp or palpitations    Gastrointestinal: Negative for nausea, pos for diarrhea and past constipation/ neg for blood in stool or dark stool  Genitourinary: Negative for urgency and frequency.  Skin: Negative for pallor or rash   MSK pos for knee pain with recent new running  Neurological: Negative for weakness, light-headedness, numbness and headaches.  Hematological: Negative for adenopathy. Does not bruise/bleed easily.  Psychiatric/Behavioral: Negative for dysphoric mood. The patient is not nervous/anxious.         Objective:   Physical Exam  Constitutional: She appears well-developed and well-nourished. No distress.  obese and well appearing   HENT:  Head: Normocephalic and atraumatic.  Mouth/Throat: Oropharynx is clear and moist.  Eyes: Conjunctivae and EOM are normal. Pupils are equal, round, and reactive to light. Right eye exhibits no discharge. Left eye exhibits no discharge. No scleral icterus.  Neck: Normal range of motion. Neck supple.  Cardiovascular: Normal rate, regular rhythm, normal heart sounds and intact distal pulses.   Pulmonary/Chest: Effort normal and breath sounds normal. No respiratory distress. She has no wheezes. She has no rales.  Abdominal: Soft. Bowel sounds are normal. She exhibits no distension and no mass. There is no hepatosplenomegaly. There is tenderness in the right lower quadrant and left lower quadrant. There is no rigidity, no rebound, no guarding, no CVA tenderness, no tenderness at McBurney's point and negative Murphy's sign.  Lymphadenopathy:    She has no cervical adenopathy.  Neurological: She is alert. She has normal reflexes.  Skin: Skin is warm and dry. No rash noted. No pallor.  Brisk capillary refill   Psychiatric: She has a normal mood and affect.           Assessment & Plan:   Problem List Items Addressed This Visit     Other   VITAMIN B12 DEFICIENCY   Diarrhea     Pt has hx of IBS however this seems a bit different with cramping and mucous in stools Cbc today c diff test today Did inst her to take fiber daily  Avoid dairy for now     Relevant Orders      CBC with Differential (Completed)      C. difficile GDH and Toxin A/B   Hypokalemia   Fatigue - Primary   Routine general medical examination at a health care facility   Obesity    Other Visit Diagnoses   Other fatigue

## 2013-10-18 LAB — CBC WITH DIFFERENTIAL/PLATELET
BASOS PCT: 1 % (ref 0.0–3.0)
Basophils Absolute: 0.1 10*3/uL (ref 0.0–0.1)
EOS ABS: 0.1 10*3/uL (ref 0.0–0.7)
Eosinophils Relative: 1.2 % (ref 0.0–5.0)
HCT: 38 % (ref 36.0–46.0)
HEMOGLOBIN: 12.7 g/dL (ref 12.0–15.0)
Lymphocytes Relative: 28.8 % (ref 12.0–46.0)
Lymphs Abs: 2.6 10*3/uL (ref 0.7–4.0)
MCHC: 33.5 g/dL (ref 30.0–36.0)
MCV: 90.6 fl (ref 78.0–100.0)
MONOS PCT: 7.4 % (ref 3.0–12.0)
Monocytes Absolute: 0.7 10*3/uL (ref 0.1–1.0)
NEUTROS ABS: 5.6 10*3/uL (ref 1.4–7.7)
Neutrophils Relative %: 61.6 % (ref 43.0–77.0)
Platelets: 260 10*3/uL (ref 150.0–400.0)
RBC: 4.19 Mil/uL (ref 3.87–5.11)
RDW: 13.3 % (ref 11.5–15.5)
WBC: 9.1 10*3/uL (ref 4.0–10.5)

## 2013-10-18 LAB — BASIC METABOLIC PANEL
BUN: 10 mg/dL (ref 6–23)
CALCIUM: 9.1 mg/dL (ref 8.4–10.5)
CHLORIDE: 103 meq/L (ref 96–112)
CO2: 25 mEq/L (ref 19–32)
Creatinine, Ser: 0.7 mg/dL (ref 0.4–1.2)
GFR: 109.85 mL/min (ref >60.00)
GLUCOSE: 93 mg/dL (ref 70–99)
Potassium: 3.9 mEq/L (ref 3.5–5.1)
Sodium: 137 mEq/L (ref 135–145)

## 2013-10-18 LAB — VITAMIN B12: Vitamin B-12: 322 pg/mL (ref 211–911)

## 2013-10-18 LAB — T3: T3, Total: 128.2 ng/dL (ref 80.0–204.0)

## 2013-10-18 LAB — T4, FREE: Free T4: 0.98 ng/dL (ref 0.60–1.60)

## 2013-10-18 LAB — TSH: TSH: 1.87 u[IU]/mL (ref 0.35–4.50)

## 2013-10-18 IMAGING — US US EXTREM LOW VENOUS*L*
1 series · 14 of 24 positions shown · non-contrast
Comparison: None.

CLINICAL DATA: Left leg pain.  Question DVT.

LEFT LOWER EXTREMITY VENOUS DUPLEX ULTRASOUND
TECHNIQUE: Gray-scale sonography with graded compression, as well
as color Doppler and duplex ultrasound, were performed to evaluate
the deep venous system of the lower extremity from the level of the
common femoral vein through the popliteal and proximal calf veins.
Spectral Doppler was utilized to evaluate flow at rest and with
distal augmentation maneuvers.

[Series 1: us extrem low venous*left* · 14 of 28 slices shown]
[im 1/28]
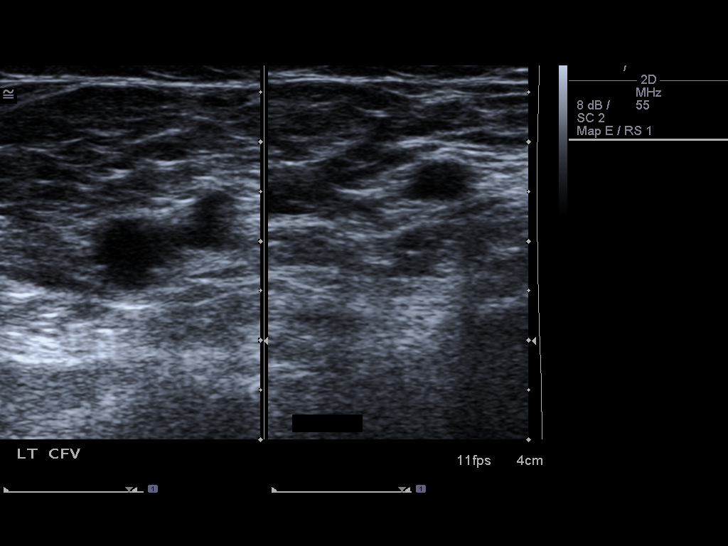
[im 3/28]
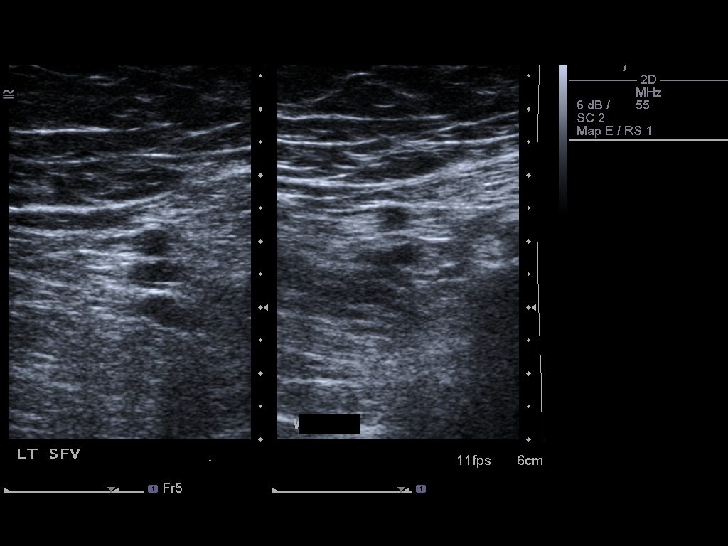
[im 5/28]
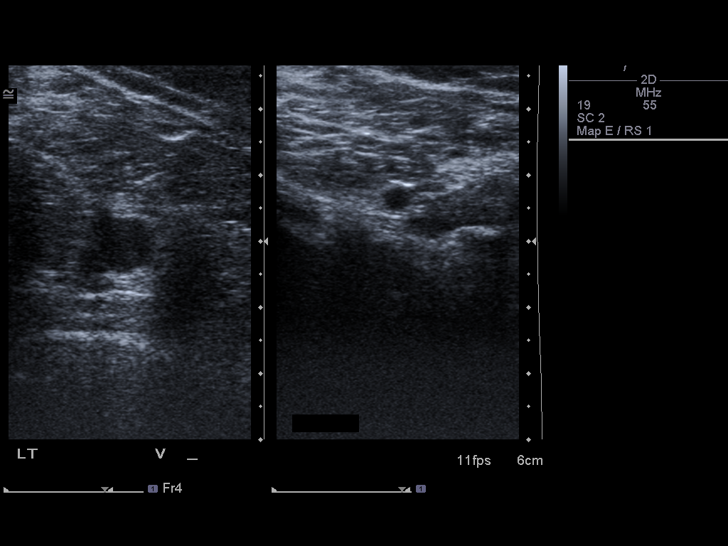
[im 8/28]
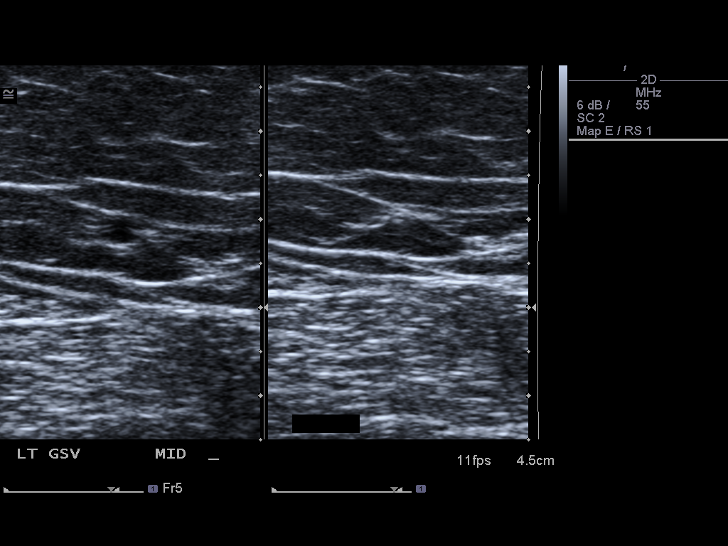
[im 9/28]
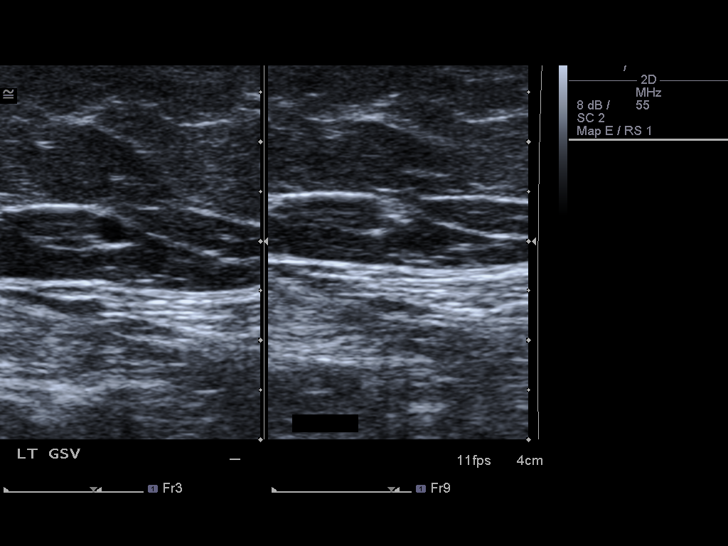
[im 11/28]
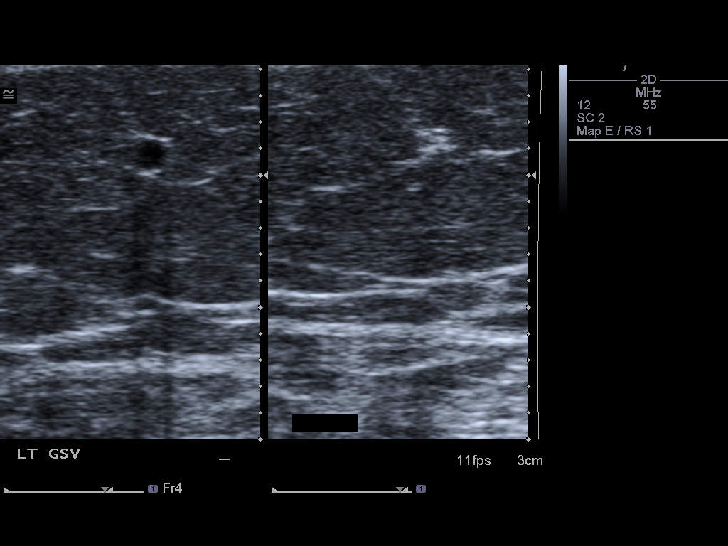
[im 13/28]
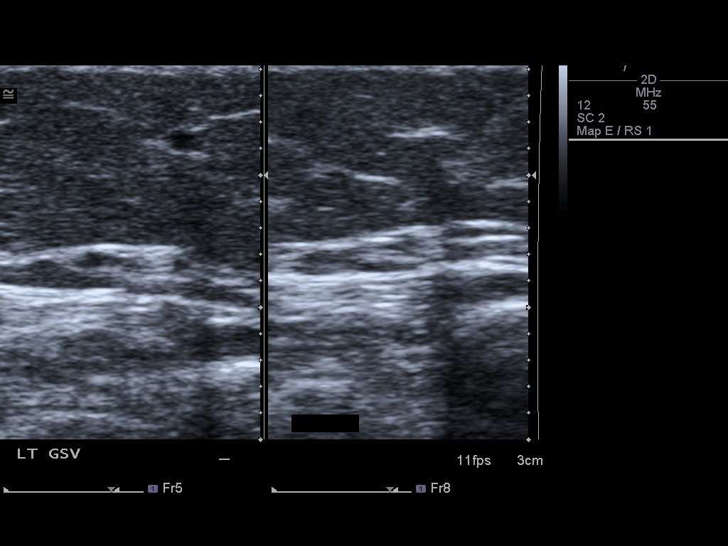
[im 15/28]
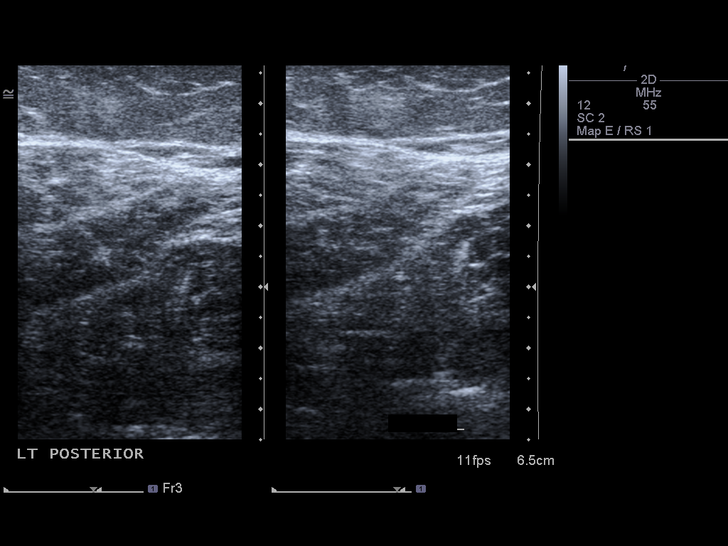
[im 17/28]
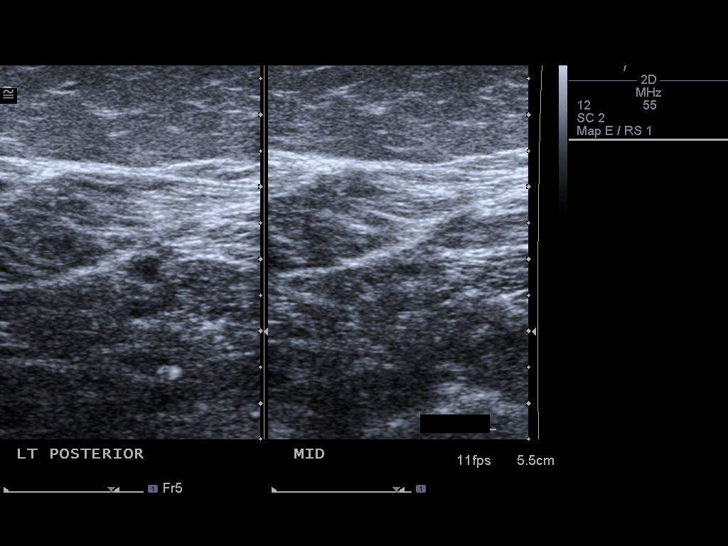
[im 19/28]
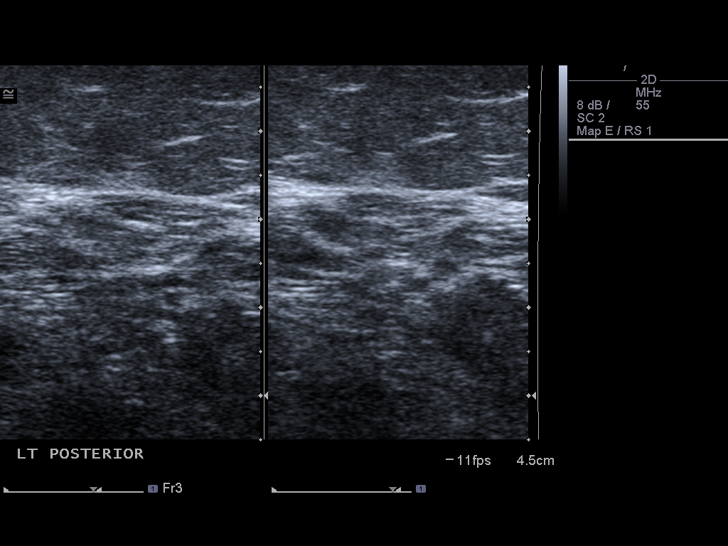
[im 22/28]
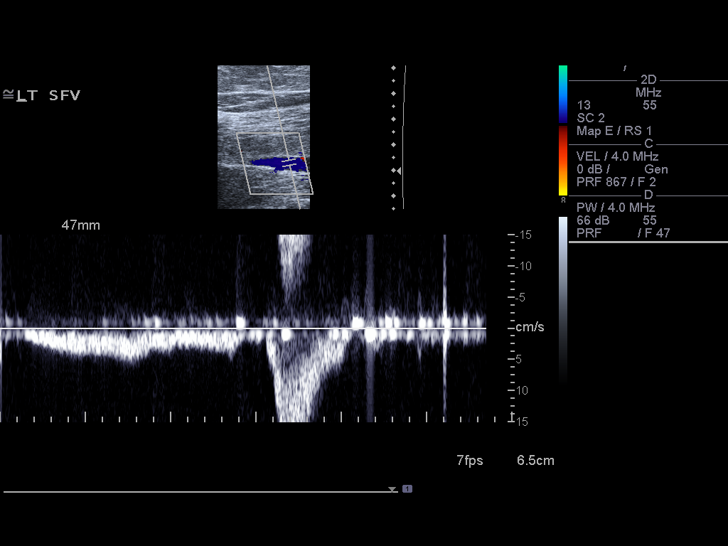
[im 23/28]
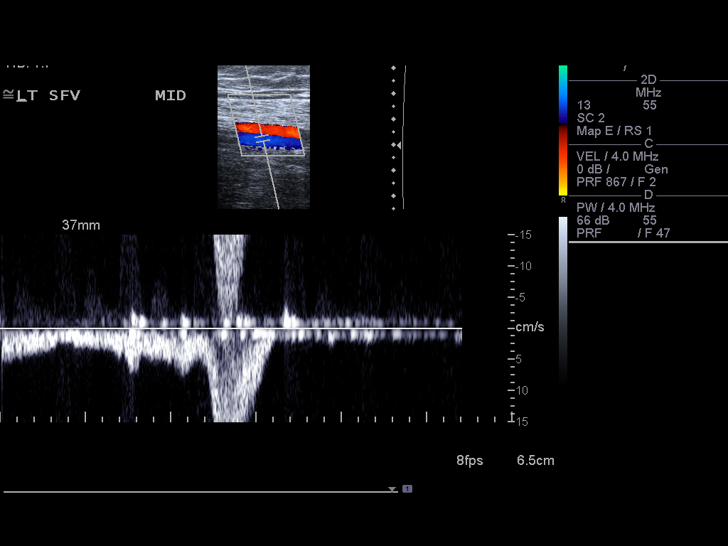
[im 25/28]
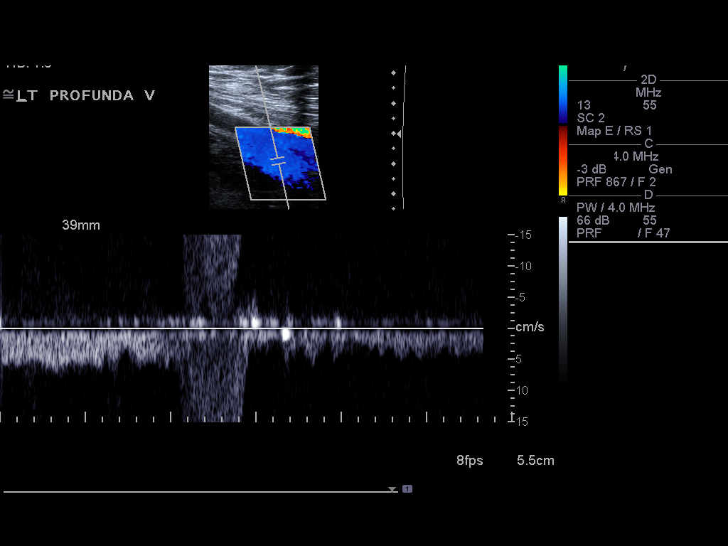
[im 28/28]
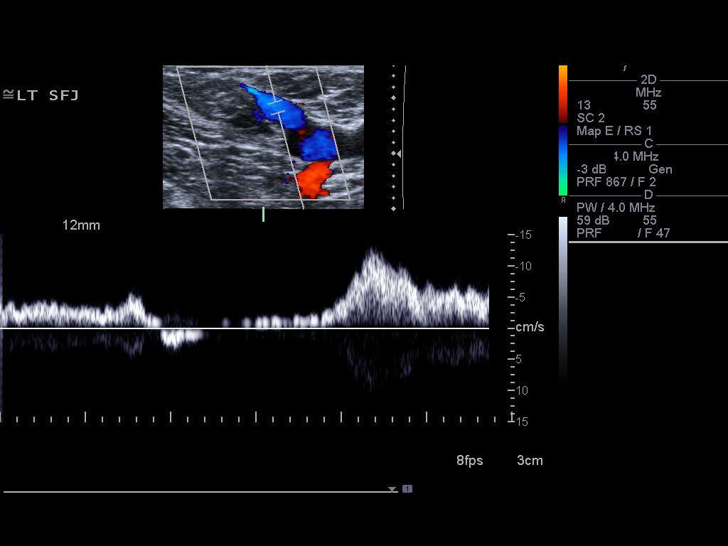

[14 of 24 positions shown; findings below may reference images not displayed]

FINDINGS: There is normal flow, compressibility and augmentation in
the left common femoral, profunda femoral, femoral and popliteal
veins.  Left posterior tibial and saphenous veins are patent and
compressible where visualized.
IMPRESSION: No evidence of deep venous thrombosis in the left lower extremity.

## 2013-11-14 ENCOUNTER — Ambulatory Visit: Payer: Self-pay | Admitting: Family Medicine

## 2013-11-27 ENCOUNTER — Telehealth: Payer: Self-pay | Admitting: Family Medicine

## 2013-11-27 NOTE — Telephone Encounter (Signed)
11/4 is ok - but that is too many complaints for a 15 min visit -we will address some of her issues/ those that may be related to tick bite -let her know  If symptoms suddenly worsen today (esp if fever)- please alert me

## 2013-11-27 NOTE — Telephone Encounter (Signed)
See below my chart message pt made appointment for 11/4 is it ok to wait??    Appointment For: [name] ([id number])    Visit Type: MYCHART OFFICE VISIT ([id number])      11/28/2013  5:15 PM 15 mins. Abner Greenspan, MD     [facility]      Patient Comments:   Office Visit   Tick bite about 88 weeks old real red , dizziness,    knees still hurt , face numbness on right side with    twitching , hand numbness fatigue ringing in ears    stiff neck and muscles .

## 2013-11-27 NOTE — Telephone Encounter (Signed)
Left voicemail requesting pt to call office 

## 2013-11-27 NOTE — Telephone Encounter (Signed)
Pt notified of Dr. Tower's comments  

## 2013-11-27 NOTE — Telephone Encounter (Signed)
See prev note

## 2013-11-28 ENCOUNTER — Ambulatory Visit (INDEPENDENT_AMBULATORY_CARE_PROVIDER_SITE_OTHER): Payer: Managed Care, Other (non HMO) | Admitting: Family Medicine

## 2013-11-28 ENCOUNTER — Encounter: Payer: Self-pay | Admitting: Family Medicine

## 2013-11-28 VITALS — BP 110/74 | HR 65 | Temp 98.0°F | Ht 63.5 in | Wt 173.0 lb

## 2013-11-28 DIAGNOSIS — M2550 Pain in unspecified joint: Secondary | ICD-10-CM

## 2013-11-28 DIAGNOSIS — T148 Other injury of unspecified body region: Secondary | ICD-10-CM

## 2013-11-28 DIAGNOSIS — R2681 Unsteadiness on feet: Secondary | ICD-10-CM | POA: Insufficient documentation

## 2013-11-28 DIAGNOSIS — R42 Dizziness and giddiness: Secondary | ICD-10-CM

## 2013-11-28 DIAGNOSIS — W57XXXA Bitten or stung by nonvenomous insect and other nonvenomous arthropods, initial encounter: Secondary | ICD-10-CM

## 2013-11-28 MED ORDER — DOXYCYCLINE HYCLATE 100 MG PO TABS: 100.0000 mg | ORAL_TABLET | Freq: Two times a day (BID) | ORAL | Status: DC

## 2013-11-28 NOTE — Progress Notes (Signed)
Pre visit review using our clinic review tool, if applicable. No additional management support is needed unless otherwise documented below in the visit note. 

## 2013-11-28 NOTE — Patient Instructions (Signed)
Lyme disease blood test today  Take the doxycycline as directed  Let me know if you develop a rash or if bite site changes

## 2013-11-28 NOTE — Progress Notes (Signed)
Subjective:    Patient ID: [name], female    DOB: 1980-10-25, 33 y.o.   MRN: [id number]  HPI Here for symptoms following a tick bite   Bite is on her R hip -there for about 3 months  It stayed red and it is itchy  At first it looked like a target and was bigger When she pulled it off-put alcohol on it and tea tree oil  Not a tiny tick -not pin head size and she does own dogs Does not think the tick was on her for more than a day  Has a scab in the middle -now down to 1 cm   No rash immediately  Has had a few blister /cyst lesions on back-come and go   Joints are stiff on and off Knees hurt a lot -wake her up at night  Headache on and off and also lightheaded   (so she drank a lot of water) No appetite   Odd numbness -like the R side of face Also arms will go numb at night  Legs will go numb with sitting   Thinks she had a fever 2 weeks ago for a few days - took motrin (thought she had a virus)- did not think much of it - highest 101.1 at night   Patient Active Problem List   Diagnosis Date Noted  . Tick bite 11/28/2013  . Joint pain 11/28/2013  . Dizziness 11/28/2013  . Anxiety disorder 03/19/2013  . Other and unspecified ovarian cyst 03/07/2013  . Pelvic pain in female 01/09/2013  . Heavy menses 01/03/2013  . Constipation, acute 01/03/2013  . Other malaise and fatigue 08/18/2012  . ADD (attention deficit disorder) 02/29/2012  . Fibromyalgia 03/26/2011  . Myofascial pain 03/19/2011  . Gluten intolerance 01/22/2011  . Obesity 11/09/2010  . Routine general medical examination at a health care facility 10/28/2010  . Hypokalemia 05/18/2010  . Fatigue 05/18/2010  . Amenorrhea 05/18/2010  . APHTHOUS ULCERS 03/27/2010  . FIBROCYSTIC BREAST DISEASE 03/27/2010  . CAVERNOUS HEMANGIOMA, LIVER 12/03/2009  . Diarrhea 12/03/2009  . PERSONAL HISTORY OF COLONIC POLYPS 12/03/2009  . ALLERGIC RHINITIS 03/09/2008  . URINARY FREQUENCY, CHRONIC 01/31/2007  . VITAMIN B12  DEFICIENCY 10/28/2006  . GERD 04/27/2006  . DYSPEPSIA 04/27/2006  . ENDOMETRIOSIS 04/27/2006  . RENAL CALCULUS, HX OF 04/27/2006  . MIGRAINES, HX OF 04/27/2006   Past Medical History  Diagnosis Date  . GERD (gastroesophageal reflux disease)   . Endometriosis   . Fatigue   . Kidney stone   . Vitamin B12 deficiency   . Migraine   . Personal history of colonic polyps 06/01/2006    sessil serrated adenoma  . Family history of malignant neoplasm of gastrointestinal tract   . Fibromyalgia   . ADD (attention deficit disorder)    Past Surgical History  Procedure Laterality Date  . Cholecystectomy    . Ovarian cyst surgery    . Laparoscopy  11/2004    endometriosis  . Breast surgery  2004    breast reduction  . Tubal ligation  2008  . Wisdom tooth extraction      with four other teeth as well   History  Substance Use Topics  . Smoking status: Former Smoker -- 0.25 packs/day    Quit date: 01/25/2005  . Smokeless tobacco: Never Used  . Alcohol Use: Yes     Comment: rare   Family History  Problem Relation Age of Onset  . Cervical cancer Mother   .  Hypertension Mother   . Diabetes Mother   . Kidney cancer Mother   . Colon polyps Sister   . Kidney failure Brother   . Breast cancer Maternal Aunt   . Breast cancer Maternal Grandmother   . Colon cancer Paternal Grandfather   . Wilson's disease Father     liver transplant  . Ulcerative colitis Father   . Esophageal cancer Neg Hx   . Stomach cancer Neg Hx   . Rectal cancer Neg Hx    Allergies  Allergen Reactions  . Strawberry Anaphylaxis and Hives  . Clindamycin     REACTION: hives, angioedema  . Gabapentin     Dizzy/ worse headache  . Penicillins     REACTION: hives, angioedema   Current Outpatient Prescriptions on File Prior to Visit  Medication Sig Dispense Refill  . cyanocobalamin (,VITAMIN B-12,) 1000 MCG/ML injection Inject 1 mL (1,000 mcg total) into the muscle every 30 (thirty) days. 1 mL 11   No current  facility-administered medications on file prior to visit.    Review of Systems Review of Systems  Constitutional: Negative for fever, appetite change, and unexpected weight change.  Eyes: Negative for pain and visual disturbance.  Respiratory: Negative for cough and shortness of breath.   Cardiovascular: Negative for cp or palpitations    Gastrointestinal: Negative for nausea, diarrhea and constipation.  Genitourinary: Negative for urgency and frequency.  Skin: Negative for pallor or rash  pos for red spot where tick bite was  MSK pos for stiff and sore joints without redness or swelling  Neurological: Negative for weakness, numbness and pos for headaches.  Hematological: Negative for adenopathy. Does not bruise/bleed easily.  Psychiatric/Behavioral: Negative for dysphoric mood. The patient is not nervous/anxious.         Objective:   Physical Exam  Constitutional: She appears well-developed and well-nourished. No distress.  obese and well appearing   HENT:  Head: Normocephalic and atraumatic.  Right Ear: External ear normal.  Left Ear: External ear normal.  Nose: Nose normal.  Mouth/Throat: Oropharynx is clear and moist.  Eyes: Conjunctivae and EOM are normal. Pupils are equal, round, and reactive to light. Right eye exhibits no discharge. Left eye exhibits no discharge. No scleral icterus.  No nystagmus but pt does get slt dizzy with lateral gaze  Neck: Normal range of motion. Neck supple. No JVD present. No thyromegaly present.  Cardiovascular: Normal rate, regular rhythm, normal heart sounds and intact distal pulses.  Exam reveals no gallop.   Pulmonary/Chest: Effort normal and breath sounds normal. No respiratory distress. She has no wheezes. She has no rales.  Abdominal: Soft. Bowel sounds are normal. She exhibits no distension and no mass. There is no tenderness.  Musculoskeletal: She exhibits no edema or tenderness.  No joint swelling or tenderness  Lymphadenopathy:     She has no cervical adenopathy.  Neurological: She is alert. She has normal reflexes. No cranial nerve deficit. She exhibits normal muscle tone. Coordination normal.  Skin: Skin is warm and dry. No rash noted. No pallor.  Dime sized area of erythema and induration on R hip with tiny scab in the middle and no drainage   Psychiatric: She has a normal mood and affect.          Assessment & Plan:   Problem List Items Addressed This Visit      Musculoskeletal and Integument   Tick bite - Primary    3 mo ago with poss bullseye lesion (pt unsure) and  now symptoms of malaise/joint stiffness Lyme tests today  Disc poss of this or STARI Given 10 d course of doxycycline bid  Update if not starting to improve in a week or if worsening   Pend results     Relevant Orders      B. Burgdorfi Antibodies (Completed)     Other   Dizziness    Fairly nl exam  Some other vague neuro changes incl ha (worse since a tick bite 3 mo ago per pt) Lab today  tx for tick bourne illness     Relevant Orders      B. Burgdorfi Antibodies (Completed)   Joint pain    S/p tick bite 3 mo ago  No joint deformity or swelling or rash Lyme titer today Also empiric tx for tick illness wit doxycycline     Relevant Orders      B. Burgdorfi Antibodies (Completed)

## 2013-11-29 LAB — B. BURGDORFI ANTIBODIES: B burgdorferi Ab IgG+IgM: 0.27 {ISR}

## 2013-11-29 NOTE — Assessment & Plan Note (Signed)
S/p tick bite 3 mo ago  No joint deformity or swelling or rash Lyme titer today Also empiric tx for tick illness wit doxycycline

## 2013-11-29 NOTE — Assessment & Plan Note (Signed)
3 mo ago with poss bullseye lesion (pt unsure) and now symptoms of malaise/joint stiffness Lyme tests today  Disc poss of this or STARI Given 10 d course of doxycycline bid  Update if not starting to improve in a week or if worsening   Pend results

## 2013-11-29 NOTE — Assessment & Plan Note (Signed)
Fairly nl exam  Some other vague neuro changes incl ha (worse since a tick bite 3 mo ago per pt) Lab today  tx for tick bourne illness

## 2013-11-30 ENCOUNTER — Encounter: Payer: Self-pay | Admitting: Family Medicine

## 2013-12-05 ENCOUNTER — Telehealth: Payer: Self-pay | Admitting: Family Medicine

## 2013-12-05 ENCOUNTER — Encounter: Payer: Self-pay | Admitting: Family Medicine

## 2013-12-05 DIAGNOSIS — R1011 Right upper quadrant pain: Secondary | ICD-10-CM | POA: Insufficient documentation

## 2013-12-05 NOTE — Telephone Encounter (Signed)
Ref for [place] for RUQ pain  See mychart email from pt [id number]

## 2013-12-05 NOTE — Telephone Encounter (Signed)
Pt also sent a mychart note but, she is not feeling any better after taking the doxycycline. She is having a lot of pain in Right flank and the right abdomin. Running a fever of 101.5.

## 2013-12-05 NOTE — Telephone Encounter (Signed)
I answered her mychart message and did a referral for abd ultrasound - she should be getting a call about that

## 2013-12-06 ENCOUNTER — Encounter (HOSPITAL_BASED_OUTPATIENT_CLINIC_OR_DEPARTMENT_OTHER): Payer: Self-pay | Admitting: *Deleted

## 2013-12-06 ENCOUNTER — Emergency Department (HOSPITAL_BASED_OUTPATIENT_CLINIC_OR_DEPARTMENT_OTHER)
Admission: EM | Admit: 2013-12-06 | Discharge: 2013-12-07 | Disposition: A | Discharge status: Home / Self Care | Payer: Managed Care, Other (non HMO) | Attending: Emergency Medicine | Admitting: Emergency Medicine

## 2013-12-06 ENCOUNTER — Telehealth: Payer: Self-pay | Admitting: Family Medicine

## 2013-12-06 ENCOUNTER — Emergency Department (HOSPITAL_BASED_OUTPATIENT_CLINIC_OR_DEPARTMENT_OTHER): Payer: Managed Care, Other (non HMO)

## 2013-12-06 DIAGNOSIS — Z8719 Personal history of other diseases of the digestive system: Secondary | ICD-10-CM | POA: Insufficient documentation

## 2013-12-06 DIAGNOSIS — Z9851 Tubal ligation status: Secondary | ICD-10-CM | POA: Diagnosis not present

## 2013-12-06 DIAGNOSIS — Z8679 Personal history of other diseases of the circulatory system: Secondary | ICD-10-CM | POA: Diagnosis not present

## 2013-12-06 DIAGNOSIS — E538 Deficiency of other specified B group vitamins: Secondary | ICD-10-CM | POA: Diagnosis not present

## 2013-12-06 DIAGNOSIS — Z880 Allergy status to penicillin: Secondary | ICD-10-CM | POA: Insufficient documentation

## 2013-12-06 DIAGNOSIS — Z792 Long term (current) use of antibiotics: Secondary | ICD-10-CM | POA: Insufficient documentation

## 2013-12-06 DIAGNOSIS — Z87448 Personal history of other diseases of urinary system: Secondary | ICD-10-CM | POA: Diagnosis not present

## 2013-12-06 DIAGNOSIS — Z3202 Encounter for pregnancy test, result negative: Secondary | ICD-10-CM | POA: Diagnosis not present

## 2013-12-06 DIAGNOSIS — Z8659 Personal history of other mental and behavioral disorders: Secondary | ICD-10-CM | POA: Insufficient documentation

## 2013-12-06 DIAGNOSIS — Z86010 Personal history of colonic polyps: Secondary | ICD-10-CM | POA: Insufficient documentation

## 2013-12-06 DIAGNOSIS — R1031 Right lower quadrant pain: Secondary | ICD-10-CM | POA: Diagnosis not present

## 2013-12-06 DIAGNOSIS — Z9089 Acquired absence of other organs: Secondary | ICD-10-CM | POA: Insufficient documentation

## 2013-12-06 DIAGNOSIS — Z87442 Personal history of urinary calculi: Secondary | ICD-10-CM | POA: Insufficient documentation

## 2013-12-06 DIAGNOSIS — Z87891 Personal history of nicotine dependence: Secondary | ICD-10-CM | POA: Diagnosis not present

## 2013-12-06 DIAGNOSIS — Z8739 Personal history of other diseases of the musculoskeletal system and connective tissue: Secondary | ICD-10-CM | POA: Diagnosis not present

## 2013-12-06 DIAGNOSIS — R1011 Right upper quadrant pain: Secondary | ICD-10-CM

## 2013-12-06 DIAGNOSIS — R109 Unspecified abdominal pain: Secondary | ICD-10-CM | POA: Diagnosis present

## 2013-12-06 LAB — URINALYSIS, ROUTINE W REFLEX MICROSCOPIC
BILIRUBIN URINE: NEGATIVE
GLUCOSE, UA: NEGATIVE mg/dL
HGB URINE DIPSTICK: NEGATIVE
Ketones, ur: NEGATIVE mg/dL
LEUKOCYTES UA: NEGATIVE
NITRITE: NEGATIVE
Protein, ur: NEGATIVE mg/dL
Specific Gravity, Urine: 1.012 (ref 1.005–1.030)
UROBILINOGEN UA: 0.2 mg/dL (ref 0.0–1.0)
pH: 6 (ref 5.0–8.0)

## 2013-12-06 LAB — COMPREHENSIVE METABOLIC PANEL
ALT: 10 U/L (ref 0–35)
AST: 17 U/L (ref 0–37)
Albumin: 4.4 g/dL (ref 3.5–5.2)
Alkaline Phosphatase: 55 U/L (ref 39–117)
Anion gap: 16 — ABNORMAL HIGH (ref 5–15)
BILIRUBIN TOTAL: 0.5 mg/dL (ref 0.3–1.2)
BUN: 7 mg/dL (ref 6–23)
CALCIUM: 9.8 mg/dL (ref 8.4–10.5)
CHLORIDE: 102 meq/L (ref 96–112)
CO2: 22 meq/L (ref 19–32)
Creatinine, Ser: 0.6 mg/dL (ref 0.50–1.10)
GFR calc non Af Amer: >90 mL/min (ref >90)
Glucose, Bld: 97 mg/dL (ref 70–99)
Potassium: 3.7 mEq/L (ref 3.7–5.3)
Sodium: 140 mEq/L (ref 137–147)
Total Protein: 7.7 g/dL (ref 6.0–8.3)

## 2013-12-06 LAB — LIPASE, BLOOD: LIPASE: 23 U/L (ref 11–59)

## 2013-12-06 LAB — CBC
HEMATOCRIT: 39.8 % (ref 36.0–46.0)
HEMOGLOBIN: 13.1 g/dL (ref 12.0–15.0)
MCH: 29.9 pg (ref 26.0–34.0)
MCHC: 32.9 g/dL (ref 30.0–36.0)
MCV: 90.9 fL (ref 78.0–100.0)
PLATELETS: 264 10*3/uL (ref 150–400)
RBC: 4.38 MIL/uL (ref 3.87–5.11)
RDW: 12.8 % (ref 11.5–15.5)
WBC: 9.8 10*3/uL (ref 4.0–10.5)

## 2013-12-06 LAB — PREGNANCY, URINE: PREG TEST UR: NEGATIVE

## 2013-12-06 MED ORDER — MORPHINE SULFATE 4 MG/ML IJ SOLN
4.0000 mg | Freq: Once | INTRAMUSCULAR | Status: DC
  Filled 2013-12-06: qty 1

## 2013-12-06 MED ORDER — ONDANSETRON HCL 4 MG/2ML IJ SOLN
4.0000 mg | Freq: Once | INTRAMUSCULAR | Status: DC
  Filled 2013-12-06: qty 2

## 2013-12-06 NOTE — Telephone Encounter (Signed)
Patient Information:  Caller Name: Tyriana  Phone: [phone]  Patient: [name], [name]  Gender: Female  DOB: 11-30-1980  Age: 33 Years  PCP: [place], [facility], [facility])  Pregnant: No  Office Follow Up:  Does the office need to follow up with this patient?: No  Instructions For The Office: N/A  RN Note:  Instructed by Mearl Latin at office to send to ED for evaluation.  Symptoms  Reason For Call & Symptoms: Patient reports she has [place] scheduled for 12/07/13; she has fever 101.5  and chills and abdominal pain.  Pain rated at 8 of 10 in right upper abdomen since 12/04/13 she is on Doxycycline since 11/28/13.  Emergent symptoms ruled out.   Go to ED Now or to Office with PCP Approval due to Constant abdominal pain lasting > 2 hours.  Called office per standing orders prior to sending to ED.  Reviewed Health History In EMR: Yes  Reviewed Medications In EMR: Yes  Reviewed Allergies In EMR: Yes  Reviewed Surgeries / Procedures: Yes  Date of Onset of Symptoms: 12/04/2013  Treatments Tried: Doxycycline, Motrin - reduces fever, but not pain  Treatments Tried Worked: No  Any Fever: Yes  Fever Taken: Oral  Fever Time Of Reading: 07:00:00  Fever Last Reading: 101.5 OB / GYN:  LMP: 11/28/2013  Guideline(s) Used:  Abdominal Pain - Upper  Disposition Per Guideline:   Go to ED Now (or to Office with PCP Approval)  Reason For Disposition Reached:   Constant abdominal pain lasting > 2 hours  Advice Given:  Call Back If:  You become worse.  RN Overrode Recommendation:  Go To ED  Per approval from [place].

## 2013-12-06 NOTE — ED Provider Notes (Signed)
CSN: [id number]     Arrival date & time 12/06/13  1922 History   First MD Initiated Contact with Patient 12/06/13 2157     This chart was scribed for Susan Rank, MD by Forrestine Him, ED Scribe. This patient was seen in room MH04/MH04 and the patient's care was started 11:36 PM.   Chief Complaint  Patient presents with  . Abdominal Pain   Patient is a 33 y.o. female presenting with abdominal pain. The history is provided by the patient. No language interpreter was used.  Abdominal Pain Pain location:  RLQ and RUQ Pain radiates to:  Does not radiate Pain severity:  Moderate Onset quality:  Gradual Duration:  3 days Timing:  Constant Progression:  Worsening Chronicity:  New Relieved by:  Nothing Worsened by:  Movement and palpation Ineffective treatments:  None tried   HPI Comments: [name] is a 33 y.o. female with a PMHx of GERD who presents to the Emergency Department complaining of constant, moderate RUQ x 3 days that has progressively worsened. Pain is exacerbated with palpation and certain movements. No alleviating factors at this time. Pt also reports recent fever and chills. She denies any fever, chills, vaginal bleeding, CP, or SOB. PCP has scheduled ultrasound for tomorrow morning 11/13. PSHx includes cholecystectomy. Pt with known allergies to penicillins, gabapentin, and clindamycin. No other concerns this visit.   Past Medical History  Diagnosis Date  . GERD (gastroesophageal reflux disease)   . Endometriosis   . Fatigue   . Kidney stone   . Vitamin B12 deficiency   . Migraine   . Personal history of colonic polyps 06/01/2006    sessil serrated adenoma  . Family history of malignant neoplasm of gastrointestinal tract   . Fibromyalgia   . ADD (attention deficit disorder)    Past Surgical History  Procedure Laterality Date  . Cholecystectomy    . Ovarian cyst surgery    . Laparoscopy  11/2004    endometriosis  . Breast surgery  2004    breast reduction  .  Tubal ligation  2008  . Wisdom tooth extraction      with four other teeth as well   Family History  Problem Relation Age of Onset  . Cervical cancer Mother   . Hypertension Mother   . Diabetes Mother   . Kidney cancer Mother   . Colon polyps Sister   . Kidney failure Brother   . Breast cancer Maternal Aunt   . Breast cancer Maternal Grandmother   . Colon cancer Paternal Grandfather   . Wilson's disease Father     liver transplant  . Ulcerative colitis Father   . Esophageal cancer Neg Hx   . Stomach cancer Neg Hx   . Rectal cancer Neg Hx    History  Substance Use Topics  . Smoking status: Former Smoker -- 0.25 packs/day    Quit date: 01/25/2005  . Smokeless tobacco: Never Used  . Alcohol Use: Yes     Comment: rare   OB History    No data available     Review of Systems  Gastrointestinal: Positive for abdominal pain.      Allergies  Strawberry; Clindamycin; Gabapentin; and Penicillins  Home Medications   Prior to Admission medications   Medication Sig Start Date End Date Taking? Authorizing Provider  cyanocobalamin (,VITAMIN B-12,) 1000 MCG/ML injection Inject 1 mL (1,000 mcg total) into the muscle every 30 (thirty) days. 10/15/13   Abner Greenspan, MD  doxycycline (VIBRA-TABS)  100 MG tablet Take 1 tablet (100 mg total) by mouth 2 (two) times daily. 11/28/13   Abner Greenspan, MD   Triage Vitals: BP 114/57 mmHg  Pulse 73  Temp(Src) 97.6 F (36.4 C) (Oral)  Resp 16  Ht 5\' 3"  (1.6 m)  Wt 173 lb (78.472 kg)  BMI 30.65 kg/m2  SpO2 99%  LMP 11/28/2013   Physical Exam  Constitutional: She appears well-developed and well-nourished. No distress.  HENT:  Head: Normocephalic and atraumatic.  Right Ear: External ear normal.  Left Ear: External ear normal.  Eyes: Conjunctivae are normal. Right eye exhibits no discharge. Left eye exhibits no discharge. No scleral icterus.  Neck: Neck supple. No tracheal deviation present.  Cardiovascular: Normal rate, regular rhythm  and intact distal pulses.   Pulmonary/Chest: Effort normal and breath sounds normal. No stridor. No respiratory distress. She has no wheezes. She has no rales.  Abdominal: Soft. Bowel sounds are normal. She exhibits no distension and no mass. There is tenderness in the right upper quadrant. There is guarding. There is no rigidity and no rebound.  Tenderness to palpation to RUQ  Musculoskeletal: She exhibits no edema or tenderness.  Neurological: She is alert. She has normal strength. No cranial nerve deficit (no facial droop, extraocular movements intact, no slurred speech) or sensory deficit. She exhibits normal muscle tone. She displays no seizure activity. Coordination normal.  Skin: Skin is warm and dry. No rash noted.  Psychiatric: She has a normal mood and affect.  Nursing note and vitals reviewed.   ED Course  Procedures (including critical care time)  DIAGNOSTIC STUDIES: Oxygen Saturation is 100% on RA, Normal by my interpretation.    COORDINATION OF CARE: 11:36 PM- Will order CT abdomen pelvis without contrast, CBC, CMP, urinalysis, and pregnancy urine. Will give morphine injection and Zofran. Discussed treatment plan with pt at bedside and pt agreed to plan.       Labs Review Labs Reviewed  COMPREHENSIVE METABOLIC PANEL - Abnormal; Notable for the following:    Anion gap 16 (*)    All other components within normal limits  CBC  URINALYSIS, ROUTINE W REFLEX MICROSCOPIC  PREGNANCY, URINE  LIPASE, BLOOD    Imaging Review Ct Abdomen Pelvis Wo Contrast  12/06/2013   CLINICAL DATA:  Right side abdominal pain.  EXAM: CT ABDOMEN AND PELVIS WITHOUT CONTRAST  TECHNIQUE: Multidetector CT imaging of the abdomen and pelvis was performed following the standard protocol without IV contrast.  COMPARISON:  CT abdomen and pelvis 03/06/2013 and 06/05/2010.  FINDINGS: Lung bases are clear.  No pleural or pericardial effusion.  A few hypo attenuating lesions in the liver are unchanged and  likely represent cysts. The patient is status post cholecystectomy. The spleen, pancreas, adrenal glands, kidneys and biliary tree appear normal. There is no hydronephrosis on the right or left and no ureteral stones are seen. Uterus, adnexa and urinary bladder are unremarkable. The stomach, small and large bowel and appendix appear normal. There is no lymphadenopathy or fluid. No bony abnormality is identified.  IMPRESSION: No acute abnormality or finding to explain the patient's symptoms. Stable compared to prior exams.   Electronically Signed   By: Inge Rise M.D.   On: 12/06/2013 22:28      MDM   Final diagnoses:  Right upper quadrant pain   Labs and CT scan unremarkable.  Etiology of her abdominal pain unclear.  Discussed findings with patient and family.  At this time there does not appear to be  any evidence of an acute emergency medical condition and the patient appears stable for discharge with appropriate outpatient follow up.   I personally performed the services described in this documentation, which was scribed in my presence. The recorded information has been reviewed and is accurate.    Susan Rank, MD 12/06/13 [phone]

## 2013-12-06 NOTE — Telephone Encounter (Signed)
Agree with advisement to send to ED

## 2013-12-06 NOTE — ED Notes (Signed)
Pt c/o right lower abd pain x 3 days  

## 2013-12-06 NOTE — Discharge Instructions (Signed)

## 2013-12-06 NOTE — ED Notes (Signed)
Patient transported to CT 

## 2013-12-06 NOTE — ED Notes (Signed)
In to start pt's IV and give meds per EDP order-pt states she does not want an IV started or meds at this time stating she wanted to wait for CT results-pt advised to let staff know if she changes her mind-EDP notified

## 2013-12-07 ENCOUNTER — Telehealth: Payer: Self-pay | Admitting: Family Medicine

## 2013-12-07 ENCOUNTER — Ambulatory Visit
Admission: RE | Admit: 2013-12-07 | Discharge: 2013-12-07 | Disposition: A | Discharge status: Home / Self Care | Payer: Managed Care, Other (non HMO) | Source: Ambulatory Visit | Attending: Family Medicine | Admitting: Family Medicine

## 2013-12-07 ENCOUNTER — Encounter: Payer: Self-pay | Admitting: Family Medicine

## 2013-12-07 DIAGNOSIS — R1030 Lower abdominal pain, unspecified: Secondary | ICD-10-CM

## 2013-12-07 DIAGNOSIS — R1011 Right upper quadrant pain: Secondary | ICD-10-CM

## 2013-12-07 DIAGNOSIS — R102 Pelvic and perineal pain: Secondary | ICD-10-CM

## 2013-12-07 NOTE — Telephone Encounter (Signed)
[facility] spoke with pt and imaging dpt. and they will try to do both

## 2013-12-07 NOTE — Addendum Note (Signed)
Addended by: Loura Pardon A on: 12/07/2013 09:18 AM   Modules accepted: Orders

## 2013-12-07 NOTE — Telephone Encounter (Signed)
Pt told me at the ER yesterday they recommended a pelvic US with her abd [place] - she has an abd [place] at 9:30 today  I will order it but do not know if they can do it or now  Not a transvaginal - just a pelvic

## 2013-12-14 ENCOUNTER — Encounter: Payer: Self-pay | Admitting: Family Medicine

## 2013-12-14 ENCOUNTER — Ambulatory Visit
Admission: RE | Admit: 2013-12-14 | Discharge: 2013-12-14 | Disposition: A | Discharge status: Home / Self Care | Payer: Managed Care, Other (non HMO) | Source: Ambulatory Visit | Attending: Family Medicine | Admitting: Family Medicine

## 2013-12-14 DIAGNOSIS — R102 Pelvic and perineal pain: Secondary | ICD-10-CM

## 2013-12-14 DIAGNOSIS — R1030 Lower abdominal pain, unspecified: Secondary | ICD-10-CM

## 2013-12-17 ENCOUNTER — Telehealth: Payer: Self-pay | Admitting: Family Medicine

## 2013-12-17 DIAGNOSIS — N83209 Unspecified ovarian cyst, unspecified side: Secondary | ICD-10-CM

## 2013-12-17 NOTE — Telephone Encounter (Signed)
Ref to gyn for ov cyst

## 2014-01-01 ENCOUNTER — Ambulatory Visit (INDEPENDENT_AMBULATORY_CARE_PROVIDER_SITE_OTHER): Payer: Managed Care, Other (non HMO) | Admitting: Family Medicine

## 2014-01-01 ENCOUNTER — Encounter: Payer: Self-pay | Admitting: Family Medicine

## 2014-01-01 VITALS — BP 115/70 | HR 79 | Ht 63.5 in | Wt 171.2 lb

## 2014-01-01 DIAGNOSIS — N83201 Unspecified ovarian cyst, right side: Secondary | ICD-10-CM

## 2014-01-01 DIAGNOSIS — N8320 Unspecified ovarian cysts: Secondary | ICD-10-CM

## 2014-01-01 DIAGNOSIS — A499 Bacterial infection, unspecified: Secondary | ICD-10-CM

## 2014-01-01 DIAGNOSIS — N760 Acute vaginitis: Secondary | ICD-10-CM

## 2014-01-01 DIAGNOSIS — B9689 Other specified bacterial agents as the cause of diseases classified elsewhere: Secondary | ICD-10-CM

## 2014-01-01 DIAGNOSIS — N809 Endometriosis, unspecified: Secondary | ICD-10-CM

## 2014-01-01 MED ORDER — METRONIDAZOLE 500 MG PO TABS: 500.0000 mg | ORAL_TABLET | Freq: Two times a day (BID) | ORAL | Status: DC

## 2014-01-01 NOTE — Progress Notes (Addendum)
    Subjective:    Patient ID: [name] is a 33 y.o. female presenting with Pelvic Pain  on 01/01/2014  HPI: [id number] with h/o endometriosis ablation previously. Has noted some abdominal bloating.  Then developed some RLQ pain, that got more severe and radiating to legs and upper back.  Notes weight fluctuation and dizziness.Having back pain constantly. Pain was worse with cycle. Very painful cycles since tubal ligation. Now desires hysterectomy for treatment of endometriosis. Notes painful periods for quite some time. Previously failed Depo Lupron and OC's. Reports painful intercourse. Periods are getting heavier and and more painful.  Pains were better on BV treatment. (2 treatments in last 6 months)  However has constant smell that is getting worse, and vaginal discharge. Notes she is taking baths.   Review of Systems  Constitutional: Negative for fever and chills.  Respiratory: Negative for shortness of breath.   Cardiovascular: Negative for chest pain.  Gastrointestinal: Negative for nausea, vomiting and abdominal pain.  Genitourinary: Negative for dysuria.  Skin: Negative for rash.      Objective:    BP 115/70 mmHg  Pulse 79  Ht 5' 3.5" (1.613 m)  Wt 171 lb 3.2 oz (77.656 kg)  BMI 29.85 kg/m2  LMP 12/26/2013 (Exact Date) Physical Exam  Constitutional: She is oriented to person, place, and time. She appears well-developed and well-nourished. No distress.  HENT:  Head: Normocephalic and atraumatic.  Eyes: No scleral icterus.  Neck: Neck supple.  Cardiovascular: Normal rate.   Pulmonary/Chest: Effort normal.  Abdominal: Soft.  Neurological: She is alert and oriented to person, place, and time.  Skin: Skin is warm and dry.  Psychiatric: She has a normal mood and affect.    Pelvic sonogram 12/14/13 FINDINGS: Uterus-Measurements: 7.9 x 3.6 x 5.5 cm. Retroverted. No fibroids or other mass visualized.  Endometrium-Thickness: 7 mm. No focal abnormality  visualized.  Right ovary-Measurements: 5.4 x 4.3 x 4.8 cm. 3.6 x 3.8 x 3.9 cm complex/hemorrhagic right ovarian cyst.  Left ovary-Measurements: 2.7 x 1.3 x 1.2 cm. Normal appearance/no adnexal mass.  Other findings-None.  IMPRESSION: 3.9 cm complex/hemorrhagic right ovarian cyst. Otherwise negative pelvic ultrasound.     Assessment & Plan:   Problem List Items Addressed This Visit      Unprioritized   Endometriosis    Given duration of symptoms and issues surrounding chronic pain and no further desire to preserve fertility, think hysterectomy would allow for definitive treatment.  Will schedule for LAVH, with probable right oophorectomy. Risks include but are not limited to bleeding, infection, injury to surrounding structures, including bowel, bladder and ureters, blood clots, and death.  Likelihood of success is moderate. Potential remains for continued pain and possible need for removal of left ovary at time of surgery or in the future. Pt. Asks about risks of ovarian cancer.  Would suspect this is not likely, given size of ovary and age. Decreasing risk with bilateral salpingectomy discussed.     Ovarian cyst    ? Hemorrhagic vs. Endometrioma--definitive treatment with hysterectomy     Other Visit Diagnoses    Bacterial vaginosis    -  Primary    recurrent--trial of prolonged treatment.    Relevant Medications       metroNIDAZOLE (FLAGYL) tablet        Return in about 3 months (around 04/02/2014) for postop check.

## 2014-01-01 NOTE — Patient Instructions (Signed)
Bacterial Vaginosis Bacterial vaginosis is a vaginal infection that occurs when the normal balance of bacteria in the vagina is disrupted. It results from an overgrowth of certain bacteria. This is the most common vaginal infection in women of childbearing age. Treatment is important to prevent complications, especially in pregnant women, as it can cause a premature delivery. CAUSES  Bacterial vaginosis is caused by an increase in harmful bacteria that are normally present in smaller amounts in the vagina. Several different kinds of bacteria can cause bacterial vaginosis. However, the reason that the condition develops is not fully understood. RISK FACTORS Certain activities or behaviors can put you at an increased risk of developing bacterial vaginosis, including:  Having a new sex partner or multiple sex partners.  Douching.  Using an intrauterine device (IUD) for contraception. Women do not get bacterial vaginosis from toilet seats, bedding, swimming pools, or contact with objects around them. SIGNS AND SYMPTOMS  Some women with bacterial vaginosis have no signs or symptoms. Common symptoms include:  Grey vaginal discharge.  A fishlike odor with discharge, especially after sexual intercourse.  Itching or burning of the vagina and vulva.  Burning or pain with urination. DIAGNOSIS  Your health care provider will take a medical history and examine the vagina for signs of bacterial vaginosis. A sample of vaginal fluid may be taken. Your health care provider will look at this sample under a microscope to check for bacteria and abnormal cells. A vaginal pH test may also be done.  TREATMENT  Bacterial vaginosis may be treated with antibiotic medicines. These may be given in the form of a pill or a vaginal cream. A second round of antibiotics may be prescribed if the condition comes back after treatment.  HOME CARE INSTRUCTIONS   Only take over-the-counter or prescription medicines as  directed by your health care provider.  If antibiotic medicine was prescribed, take it as directed. Make sure you finish it even if you start to feel better.  Do not have sex until treatment is completed.  Tell all sexual partners that you have a vaginal infection. They should see their health care provider and be treated if they have problems, such as a mild rash or itching.  Practice safe sex by using condoms and only having one sex partner. SEEK MEDICAL CARE IF:   Your symptoms are not improving after 3 days of treatment.  You have increased discharge or pain.  You have a fever. MAKE SURE YOU:   Understand these instructions.  Will watch your condition.  Will get help right away if you are not doing well or get worse. FOR MORE INFORMATION  Centers for Disease Control and Prevention, Division of STD Prevention: [employer] American Sexual Health Association (ASHA): www.ashastd.org  Document Released: 01/11/2005 Document Revised: 11/01/2012 Document Reviewed: 08/23/2012 [facility] Patient Information 2015 [place], [place]. This information is not intended to replace advice given to you by your health care provider. Make sure you discuss any questions you have with your health care provider. Endometriosis Endometriosis is a condition in which the tissue that lines the uterus (endometrium) grows outside of its normal location. The tissue may grow in many locations close to the uterus, but it commonly grows on the ovaries, fallopian tubes, vagina, or bowel. Because the uterus expels, or sheds, its lining every menstrual cycle, there is bleeding wherever the endometrial tissue is located. This can cause pain because blood is irritating to tissues not normally exposed to it.  CAUSES  The cause of endometriosis  is not known.  SIGNS AND SYMPTOMS  Often, there are no symptoms. When symptoms are present, they can vary with the location of the displaced tissue. Various symptoms can occur at  different times. Although symptoms occur mainly during a woman's menstrual period, they can also occur midcycle and usually stop with menopause. Some people may go months with no symptoms at all. Symptoms may include:   Back or abdominal pain.   Heavier bleeding during periods.   Pain during intercourse.   Painful bowel movements.   Infertility. DIAGNOSIS  Your health care provider will do a physical exam and ask about your symptoms. Various tests may be done, such as:   Blood tests and urine tests. These are done to help rule out other problems.   Ultrasound. This test is done to look for abnormal tissue.   An X-ray of the lower bowel (barium enema).  Laparoscopy. In this procedure, a thin, lighted tube with a tiny camera on the end (laparoscope) is inserted into your abdomen. This helps your health care provider look for abnormal tissue to confirm the diagnosis. The health care provider may also remove a small piece of tissue (biopsy) from any abnormal tissue found. This tissue sample can then be sent to a lab so it can be looked at under a microscope. TREATMENT  Treatment will vary and may include:   Medicines to relieve pain. Nonsteroidal anti-inflammatory drugs (NSAIDs) are a type of pain medicine that can help to relieve the pain caused by endometriosis.  Hormonal therapy. When using hormonal therapy, periods are eliminated. This eliminates the monthly exposure to blood by the displaced endometrial tissue.   Surgery. Surgery may sometimes be done to remove the abnormal endometrial tissue. In severe cases, surgery may be done to remove the fallopian tubes, uterus, and ovaries (hysterectomy). HOME CARE INSTRUCTIONS   Take all medicines as directed by your health care provider. Do not take aspirin because it may increase bleeding when you are not on hormonal therapy.   Avoid activities that produce pain, including sexual activity. SEEK MEDICAL CARE IF:  You have  pelvic pain before, after, or during your periods.  You have pelvic pain between periods that gets worse during your period.  You have pelvic pain during or after sex.  You have pelvic pain with bowel movements or urination, especially during your period.  You have problems getting pregnant.  You have a fever. SEEK IMMEDIATE MEDICAL CARE IF:   Your pain is severe and is not responding to pain medicine.   You have severe nausea and vomiting, or you cannot keep foods down.   You have pain that is limited to the right lower part of your abdomen.   You have swelling or increasing pain in your abdomen.   You see blood in your stool.  MAKE SURE YOU:   Understand these instructions.  Will watch your condition.  Will get help right away if you are not doing well or get worse. Document Released: 01/09/2000 Document Revised: 05/28/2013 Document Reviewed: 09/08/2012 [facility] Patient Information 2015 [place], [place]. This information is not intended to replace advice given to you by your health care provider. Make sure you discuss any questions you have with your health care provider.

## 2014-01-01 NOTE — Addendum Note (Signed)
Addended by: Donnamae Jude on: 01/01/2014 03:05 PM   Modules accepted: Level of Service

## 2014-01-01 NOTE — Assessment & Plan Note (Signed)
?   Hemorrhagic vs. Endometrioma--definitive treatment with hysterectomy

## 2014-01-01 NOTE — Assessment & Plan Note (Signed)
Given duration of symptoms and issues surrounding chronic pain and no further desire to preserve fertility, think hysterectomy would allow for definitive treatment.  Will schedule for LAVH, with probable right oophorectomy. Risks include but are not limited to bleeding, infection, injury to surrounding structures, including bowel, bladder and ureters, blood clots, and death.  Likelihood of success is moderate. Potential remains for continued pain and possible need for removal of left ovary at time of surgery or in the future. Pt. Asks about risks of ovarian cancer.  Would suspect this is not likely, given size of ovary and age. Decreasing risk with bilateral salpingectomy discussed.

## 2014-01-01 NOTE — Progress Notes (Signed)
Pelvic pain, patient was told she had a large complex cyst and needed to follow up with GYN due to the symptoms that she is having which include pelvic pain, pain with intercourse, bloating and dizziness.

## 2014-01-08 ENCOUNTER — Encounter: Payer: Self-pay | Admitting: Family Medicine

## 2014-01-13 NOTE — H&P (Signed)
[name] is an 33 y.o. [id number] Unknown female.   Chief Complaint: Pelvic pain  HPI: [id number] with h/o endometriosis ablation previously. Has noted some abdominal bloating. Then developed some RLQ pain, that got more severe and radiating to legs and upper back. Notes weight fluctuation and dizziness.Having back pain constantly. Pain was worse with cycle. Very painful cycles since tubal ligation. Now desires hysterectomy for treatment of endometriosis. Notes painful periods for quite some time. Previously failed Depo Lupron and OC's. Reports painful intercourse. Periods are getting heavier and and more painful.   Past Medical History  Diagnosis Date  . GERD (gastroesophageal reflux disease)   . Endometriosis   . Fatigue   . Kidney stone   . Vitamin B12 deficiency   . Migraine   . Personal history of colonic polyps 06/01/2006    sessil serrated adenoma  . Family history of malignant neoplasm of gastrointestinal tract   . Fibromyalgia   . ADD (attention deficit disorder)     Past Surgical History  Procedure Laterality Date  . Cholecystectomy    . Ovarian cyst surgery    . Laparoscopy  11/2004    endometriosis  . Breast surgery  2004    breast reduction  . Tubal ligation  2008  . Wisdom tooth extraction      with four other teeth as well    Family History  Problem Relation Age of Onset  . Cervical cancer Mother   . Hypertension Mother   . Diabetes Mother   . Kidney cancer Mother   . Colon polyps Sister   . Kidney failure Brother   . Breast cancer Maternal Aunt   . Breast cancer Maternal Grandmother   . Colon cancer Paternal Grandfather   . Wilson's disease Father     liver transplant  . Ulcerative colitis Father   . Esophageal cancer Neg Hx   . Stomach cancer Neg Hx   . Rectal cancer Neg Hx    Social History:  reports that she quit smoking about 8 years ago. She has never used smokeless tobacco. She reports that she drinks alcohol. She reports that she does not  use illicit drugs.   Allergies  Allergen Reactions  . Strawberry Anaphylaxis and Hives  . Clindamycin     REACTION: hives, angioedema  . Gabapentin     Dizzy/ worse headache  . Penicillins     REACTION: hives, angioedema    No current facility-administered medications on file prior to encounter.   Current Outpatient Prescriptions on File Prior to Encounter  Medication Sig Dispense Refill  . cyanocobalamin (,VITAMIN B-12,) 1000 MCG/ML injection Inject 1 mL (1,000 mcg total) into the muscle every 30 (thirty) days. 1 mL 11  . cyclobenzaprine (FLEXERIL) 10 MG tablet Take 10 mg by mouth.    . diclofenac (CATAFLAM) 50 MG tablet Take 50 mg by mouth.    . metroNIDAZOLE (FLAGYL) 500 MG tablet Take 1 tablet (500 mg total) by mouth 2 (two) times daily. 28 tablet 0    Pertinent items are noted in HPI.  Last menstrual period 12/26/2013. General appearance: alert, cooperative and appears stated age Head: Normocephalic, without obvious abnormality, atraumatic Neck: supple, symmetrical, trachea midline Lungs: normal effort Heart: regular rate and rhythm Abdomen: soft, non-tender; bowel sounds normal; no masses,  no organomegaly Extremities: extremities normal, atraumatic, no cyanosis or edema Skin: Skin color, texture, turgor normal. No rashes or lesions Neurologic: Grossly normal   Lab Results  Component Value Date   WBC  9.8 12/06/2013   HGB 13.1 12/06/2013   HCT 39.8 12/06/2013   MCV 90.9 12/06/2013   PLT 264 12/06/2013   Lab Results  Component Value Date   PREGTESTUR NEGATIVE 12/06/2013   PREGSERUM NEG 08/31/2012   Pelvic sonogram 12/14/13 FINDINGS: Uterus-Measurements: 7.9 x 3.6 x 5.5 cm. Retroverted. No fibroids or other mass visualized.  Endometrium-Thickness: 7 mm. No focal abnormality visualized.  Right ovary-Measurements: 5.4 x 4.3 x 4.8 cm. 3.6 x 3.8 x 3.9 cm complex/hemorrhagic right ovarian cyst.  Left ovary-Measurements: 2.7 x 1.3 x 1.2 cm. Normal  appearance/no adnexal mass.  Other findings-None.  IMPRESSION: 3.9 cm complex/hemorrhagic right ovarian cyst. Otherwise negative pelvic ultrasound.  Assessment/Plan Principal Problem:   Endometriosis Active Problems:   Heavy menses   Pelvic pain in female   Ovarian cyst   Given duration of symptoms and issues surrounding chronic pain and no further desire to preserve fertility, think hysterectomy would allow for definitive treatment. Will schedule for LAVH, with probable right oophorectomy. Risks include but are not limited to bleeding, infection, injury to surrounding structures, including bowel, bladder and ureters, blood clots, and death. Likelihood of success is moderate. Potential remains for continued pain and possible need for removal of left ovary at time of surgery or in the future. Pt. Asks about risks of ovarian cancer. Would suspect this is not likely, given size of ovary and age. Decreasing risk with bilateral salpingectomy discussed.     Kenia Teagarden S 01/13/2014, 3:32 PM

## 2014-01-15 ENCOUNTER — Encounter: Payer: Self-pay | Admitting: Family Medicine

## 2014-01-22 ENCOUNTER — Ambulatory Visit (HOSPITAL_COMMUNITY)
Admission: RE | Admit: 2014-01-22 | Payer: Managed Care, Other (non HMO) | Source: Ambulatory Visit | Admitting: Family Medicine

## 2014-01-22 ENCOUNTER — Encounter (HOSPITAL_COMMUNITY): Admission: RE | Payer: Self-pay | Source: Ambulatory Visit

## 2014-01-22 SURGERY — HYSTERECTOMY, VAGINAL, LAPAROSCOPY-ASSISTED
Anesthesia: Choice | Site: Vagina

## 2014-01-31 ENCOUNTER — Telehealth: Payer: Managed Care, Other (non HMO) | Admitting: Physician Assistant

## 2014-01-31 DIAGNOSIS — J329 Chronic sinusitis, unspecified: Principal | ICD-10-CM

## 2014-01-31 DIAGNOSIS — B9789 Other viral agents as the cause of diseases classified elsewhere: Secondary | ICD-10-CM

## 2014-01-31 MED ORDER — FLUTICASONE PROPIONATE 50 MCG/ACT NA SUSP: 2.0000 | Freq: Every day | NASAL | Status: DC

## 2014-01-31 NOTE — Progress Notes (Signed)
We are sorry that you are not feeling well.  Here is how we plan to help!  Based on what you have shared with me it looks like you have sinusitis.  Sinusitis is inflammation and infection in the sinus cavities of the head.  Based on your presentation I believe you most likely have Acute Viral Sinusitis. This is an infection most likely caused by a virus.  There is not specific treatment for viral sinusitis other than to help you with the symptoms until the infection runs it's course.  You may use an oral decongestant such as Mucinex D or if you have glaucoma or high blood pressure use plain Mucinex.  Saline nasal spray help and can safely be used as often as needed for congestion, I have prescribed fluticason nasal spray. Spray two sprays in each nostril twice a day to help reduce your symptoms.  Some authorities believe that zinc sprays or the use of Echinacea may shorten the course of your symptoms  Sinus infections are not as easily transmitted as other respiratory infection, however we still recommend that you avoid close contact with loved ones, especially the very young and elderly.  Remember to wash your hands thoroughly throughout the day as this is the number one way to prevent the spread of infection!  Home Care:  Only take medications as instructed by your medical team.  Complete the entire course of an antibiotic.  Do not take these medications with alcohol.  A steam or ultrasonic humidifier can help congestion.  You can place a towel over your head and breathe in the steam from hot water coming from a faucet.  Avoid close contacts especially the very young and the elderly.  Cover your mouth when you cough or sneeze.  Always remember to wash your hands.  Get Help Right Away If:  You develop worsening fever or sinus pain.  You develop a severe head ache or visual changes.  Your symptoms persist after you have completed your treatment plan.  Make sure you  Understand these  instructions.  Will watch your condition.  Will get help right away if you are not doing well or get worse.  Your e-visit answers were reviewed by a board certified advanced clinical practitioner to complete your personal care plan.  Depending on the condition, your plan could have included both over the counter or prescription medications.  If there is a problem please reply  once you have received a response from your provider.  Your safety is important to [place].  If you have drug allergies check your prescription carefully.    You can use MyChart to ask questions about today's visit, request a non-urgent call back, or ask for a work or school excuse.  You will get an e-mail in the next two days asking about your experience.  I hope that your e-visit has been valuable and will speed your recovery. Thank you for using e-visits.

## 2014-02-05 ENCOUNTER — Encounter: Payer: Self-pay | Admitting: Family Medicine

## 2014-02-06 ENCOUNTER — Encounter: Payer: Self-pay | Admitting: Family Medicine

## 2014-05-16 ENCOUNTER — Ambulatory Visit (INDEPENDENT_AMBULATORY_CARE_PROVIDER_SITE_OTHER): Payer: Managed Care, Other (non HMO) | Admitting: Family Medicine

## 2014-05-16 ENCOUNTER — Encounter: Payer: Self-pay | Admitting: Family Medicine

## 2014-05-16 VITALS — BP 106/68 | HR 72 | Temp 98.0°F | Ht 63.5 in | Wt 172.8 lb

## 2014-05-16 DIAGNOSIS — Z0000 Encounter for general adult medical examination without abnormal findings: Secondary | ICD-10-CM | POA: Diagnosis not present

## 2014-05-16 DIAGNOSIS — E669 Obesity, unspecified: Secondary | ICD-10-CM | POA: Diagnosis not present

## 2014-05-16 DIAGNOSIS — R319 Hematuria, unspecified: Secondary | ICD-10-CM

## 2014-05-16 DIAGNOSIS — R202 Paresthesia of skin: Secondary | ICD-10-CM | POA: Insufficient documentation

## 2014-05-16 DIAGNOSIS — R2681 Unsteadiness on feet: Secondary | ICD-10-CM

## 2014-05-16 LAB — POCT URINALYSIS DIPSTICK
BILIRUBIN UA: NEGATIVE
GLUCOSE UA: NEGATIVE
Ketones, UA: NEGATIVE
Leukocytes, UA: NEGATIVE
Nitrite, UA: NEGATIVE
Protein, UA: NEGATIVE
RBC UA: NEGATIVE
SPEC GRAV UA: 1.025
Urobilinogen, UA: 0.2
pH, UA: 6

## 2014-05-16 NOTE — Addendum Note (Signed)
Addended by: Josetta Huddle on: 05/16/2014 06:41 PM   Modules accepted: Orders

## 2014-05-16 NOTE — Assessment & Plan Note (Signed)
Preventative protocols reviewed and updated unless pt declined. Discussed healthy diet and lifestyle.  

## 2014-05-16 NOTE — Assessment & Plan Note (Signed)
Endorses hematuria - and printing fume exposure, as well as fmhx kidney disease and cancer. Check UA today. Reviewed latest abd [place] in chart from 11/2013 - normal kidneys at that time

## 2014-05-16 NOTE — Assessment & Plan Note (Addendum)
Noted today - swaying with romberg. Will await labwork. Already receiving B12 shots and oral B12 as well.  Recommended MVI no iron.

## 2014-05-16 NOTE — Addendum Note (Signed)
Addended by: Ria Bush on: 05/16/2014 07:39 PM   Modules accepted: Miquel Dunn

## 2014-05-16 NOTE — Patient Instructions (Addendum)
Urinalysis and labwork today. We will call you with results. Look for multivitamin no iron.  Good to se you today, call us with questions.

## 2014-05-16 NOTE — Progress Notes (Signed)
Pre visit review using our clinic review tool, if applicable. No additional management support is needed unless otherwise documented below in the visit note. 

## 2014-05-16 NOTE — Assessment & Plan Note (Addendum)
Endorses intermittent paresthesias but not consistent with CTS or neuropathy. Strength and sensation and reflexes are intact today. Mildly positive romberg noted today. Will review labs when they arrive.

## 2014-05-16 NOTE — Addendum Note (Signed)
Addended by: Ria Bush on: 05/16/2014 06:38 PM   Modules accepted: Miquel Dunn

## 2014-05-16 NOTE — Progress Notes (Signed)
BP 106/68 mmHg  Pulse 72  Temp(Src) 98 F (36.7 C) (Oral)  Ht 5' 3.5" (1.613 m)  Wt 172 lb 12 oz (78.359 kg)  BMI 30.12 kg/m2  SpO2 97%   CC: CPE  Subjective:    Patient ID: [name], female    DOB: Jul 01, 1980, 34 y.o.   MRN: [id number]  HPI: [name] is a 34 y.o. female presenting on 05/16/2014 for Annual Exam   Patient of Dr. Marliss Coots presents today for physical. She made physical appointment through [facility].  Getting B12 shots monthly. This has not helped fatigue. Also using B12 drops orally daily for last few weeks.   Over last week and again today having paresthesias that wake her up (bilateral arms and legs), progressively worsening. Alternating hot and cold flashes. Trouble sleeping from numbness. Legs feel weak.   Has noticed blood in urine. + fmhx kidney cancer (mother). No radiation exposure, heavy metal exposure, or smoking. + printing fume exposure at work (10/2013).  No recent viral URI.  Has had recurrent BV infections.   Fasting today since 12:30pm.  No vitamins, supplements or OTCs. DHEA didn't help. MVI caused constipation.   Preventative: Well woman with Dr. Kennon Rounds 12/2013 - not due for pap yet. H/o endometriosis. LMP 3 wks ago.  Flu did not receive  Td 2010 Seat belt use discussed. Sunscreen use discussed. No suspicious moles on skin  Lives with husband and son, 1 dog Occupation: Sales promotion account executive Activity: yoga, walks 3x/wk 2 mi Diet: gluten free diet, avoids fried foods, good water 32-64 oz, fruits/vegetables daily, no sodas  Relevant past medical, surgical, family and social history reviewed and updated as indicated. Interim medical history since our last visit reviewed. Allergies and medications reviewed and updated. Current Outpatient Prescriptions on File Prior to Visit  Medication Sig  . cyanocobalamin (,VITAMIN B-12,) 1000 MCG/ML injection Inject 1 mL (1,000 mcg total) into the muscle every 30 (thirty) days.   No current  facility-administered medications on file prior to visit.    Review of Systems  Constitutional: Negative for fever, chills, activity change, appetite change, fatigue and unexpected weight change.  HENT: Negative for hearing loss.   Eyes: Negative for visual disturbance.  Respiratory: Positive for chest tightness (mild). Negative for cough, shortness of breath and wheezing.   Cardiovascular: Negative for chest pain, palpitations and leg swelling.  Gastrointestinal: Positive for nausea (mild). Negative for vomiting, abdominal pain, diarrhea, constipation, blood in stool and abdominal distention.  Endocrine: Positive for cold intolerance and heat intolerance.  Genitourinary: Positive for hematuria (mild recently over last week). Negative for difficulty urinating.  Musculoskeletal: Negative for myalgias, arthralgias and neck pain.  Skin: Negative for rash.  Neurological: Positive for dizziness, numbness and headaches (today). Negative for seizures and syncope.  Hematological: Negative for adenopathy. Bruises/bleeds easily (mild, not spontaneous).  Psychiatric/Behavioral: Negative for dysphoric mood. The patient is nervous/anxious (occasoinally night time).    Per HPI unless specifically indicated above     Objective:    BP 106/68 mmHg  Pulse 72  Temp(Src) 98 F (36.7 C) (Oral)  Ht 5' 3.5" (1.613 m)  Wt 172 lb 12 oz (78.359 kg)  BMI 30.12 kg/m2  SpO2 97%  Wt Readings from Last 3 Encounters:  05/16/14 172 lb 12 oz (78.359 kg)  01/01/14 171 lb 3.2 oz (77.656 kg)  11/28/13 173 lb (78.472 kg)    Physical Exam  Constitutional: She is oriented to person, place, and time. She appears well-developed and well-nourished. No  distress.  HENT:  Head: Normocephalic and atraumatic.  Right Ear: Hearing, tympanic membrane, external ear and ear canal normal.  Left Ear: Hearing, tympanic membrane, external ear and ear canal normal.  Nose: Nose normal.  Mouth/Throat: Uvula is midline, oropharynx is  clear and moist and mucous membranes are normal. No oropharyngeal exudate, posterior oropharyngeal edema or posterior oropharyngeal erythema.  Eyes: Conjunctivae and EOM are normal. Pupils are equal, round, and reactive to light. No scleral icterus.  Neck: Normal range of motion. Neck supple. No thyromegaly present.  Cardiovascular: Normal rate, regular rhythm, normal heart sounds and intact distal pulses.   No murmur heard. Pulses:      Radial pulses are 2+ on the right side, and 2+ on the left side.  Pulmonary/Chest: Effort normal and breath sounds normal. No respiratory distress. She has no wheezes. She has no rales.  Abdominal: Soft. Bowel sounds are normal. She exhibits no distension and no mass. There is no tenderness. There is no rebound and no guarding.  Genitourinary:  GYN - per OBGYN  Musculoskeletal: Normal range of motion. She exhibits no edema.  2+ DP bilaterally Neurovascularly intact Normal skin turgor Normal sensation of feet to light touch, temperature and monofilament.  Lymphadenopathy:    She has no cervical adenopathy.  Neurological: She is alert and oriented to person, place, and time. She has normal strength. No cranial nerve deficit or sensory deficit. Coordination and gait normal.  Reflex Scores:      Bicep reflexes are 2+ on the right side and 2+ on the left side.      Patellar reflexes are 2+ on the right side and 2+ on the left side. CN 2-12 intact FTN intact 5/5 strength BUE and BLE Sensation intact to light touch and temperature. + romberg (unsteady sway with eyes closed). No pronator drift. Gait intact.  Skin: Skin is warm and dry. No rash noted.  Psychiatric: She has a normal mood and affect. Her behavior is normal. Judgment and thought content normal.  Nursing note and vitals reviewed.     Assessment & Plan:   Problem List Items Addressed This Visit    Unsteadiness on feet    Noted today - swaying with romberg. Will await labwork. Already  receiving B12 shots and oral B12 as well.  Recommended MVI no iron.      Paresthesias    Endorses intermittent paresthesias but not consistent with CTS or neuropathy. Strength and sensation and reflexes are intact today. Mildly positive romberg noted today. Will review labs when they arrive.      Relevant Orders   TSH   Folate   Vitamin B12   CBC with Differential/Platelet   T4, free   Sedimentation rate   Obesity    Discussed healthy diet and lifestyle changes to affect sustainable weight loss. Body mass index is 30.12 kg/(m^2).      Relevant Orders   Comprehensive metabolic panel   Hematuria    Endorses hematuria - and printing fume exposure, as well as fmhx kidney disease and cancer. Check UA today. Reviewed latest abd [place] in chart from 11/2013 - normal kidneys at that time      Health maintenance examination - Primary    Preventative protocols reviewed and updated unless pt declined. Discussed healthy diet and lifestyle.           Follow up plan: Return in about 1 year (around 05/16/2015), or as needed, for annual exam, prior fasting for blood work.

## 2014-05-16 NOTE — Assessment & Plan Note (Signed)
Discussed healthy diet and lifestyle changes to affect sustainable weight loss. Body mass index is 30.12 kg/(m^2).

## 2014-05-17 LAB — COMPREHENSIVE METABOLIC PANEL
ALK PHOS: 56 U/L (ref 39–117)
ALT: 11 U/L (ref 0–35)
AST: 14 U/L (ref 0–37)
Albumin: 4.3 g/dL (ref 3.5–5.2)
BILIRUBIN TOTAL: 0.6 mg/dL (ref 0.2–1.2)
BUN: 12 mg/dL (ref 6–23)
CALCIUM: 9.6 mg/dL (ref 8.4–10.5)
CO2: 24 mEq/L (ref 19–32)
Chloride: 102 mEq/L (ref 96–112)
Creatinine, Ser: 0.64 mg/dL (ref 0.40–1.20)
GFR: 111.46 mL/min (ref >60.00)
Glucose, Bld: 87 mg/dL (ref 70–99)
Potassium: 3.7 mEq/L (ref 3.5–5.1)
Sodium: 136 mEq/L (ref 135–145)
Total Protein: 7.4 g/dL (ref 6.0–8.3)

## 2014-05-17 LAB — FOLATE: Folate: 7.5 ng/mL (ref >5.9)

## 2014-05-17 LAB — CBC WITH DIFFERENTIAL/PLATELET
BASOS PCT: 1.4 % (ref 0.0–3.0)
Basophils Absolute: 0.1 10*3/uL (ref 0.0–0.1)
Eosinophils Absolute: 0.1 10*3/uL (ref 0.0–0.7)
Eosinophils Relative: 1 % (ref 0.0–5.0)
HCT: 39.3 % (ref 36.0–46.0)
Hemoglobin: 13.3 g/dL (ref 12.0–15.0)
LYMPHS PCT: 28.5 % (ref 12.0–46.0)
Lymphs Abs: 2.4 10*3/uL (ref 0.7–4.0)
MCHC: 33.8 g/dL (ref 30.0–36.0)
MCV: 88.9 fl (ref 78.0–100.0)
Monocytes Absolute: 0.9 10*3/uL (ref 0.1–1.0)
Monocytes Relative: 10.4 % (ref 3.0–12.0)
NEUTROS ABS: 4.8 10*3/uL (ref 1.4–7.7)
Neutrophils Relative %: 58.7 % (ref 43.0–77.0)
Platelets: 249 10*3/uL (ref 150.0–400.0)
RBC: 4.42 Mil/uL (ref 3.87–5.11)
RDW: 13.8 % (ref 11.5–15.5)
WBC: 8.3 10*3/uL (ref 4.0–10.5)

## 2014-05-17 LAB — SEDIMENTATION RATE: Sed Rate: 12 mm/hr (ref 0–22)

## 2014-05-17 LAB — T4, FREE: FREE T4: 0.93 ng/dL (ref 0.60–1.60)

## 2014-05-17 LAB — TSH: TSH: 2.63 u[IU]/mL (ref 0.35–4.50)

## 2014-05-17 LAB — VITAMIN B12: Vitamin B-12: 426 pg/mL (ref 211–911)

## 2014-05-20 ENCOUNTER — Encounter: Payer: Self-pay | Admitting: Family Medicine

## 2014-05-22 ENCOUNTER — Telehealth: Payer: Self-pay | Admitting: Family

## 2014-05-22 DIAGNOSIS — L259 Unspecified contact dermatitis, unspecified cause: Secondary | ICD-10-CM

## 2014-05-22 NOTE — Progress Notes (Signed)
Based on what you shared with me it looks like you have a serious condition that should be evaluated in a face to face office visit.  You need to follow up to face to face. It may be some type of contact dermatis.   If you are having a true medical emergency please call 911.  If you need an urgent face to face visit, [facility] has four urgent care centers for your convenience.  . [facility] a Provider at this Location  [address] [place], [place] [address] . 8 am to 8 pm Monday-Friday . 9 am to 7 pm Saturday-Sunday  . [facility] [place] a Provider at this Location  [address], [place], [place] [address] . 8 am to 8 pm Monday-Friday . 9 am to 6 pm Saturday . 11 am to 6 pm Sunday   . [facility] at [place] Get Driving Directions  [address] [place], [place] [address] . 8 am to 8 pm Monday-Friday . 9 am to 4 pm Saturday-Sunday   . [facility] (a walk in primary care provider)  [place] a Provider at this Location  [place],  [address] . 8 am to 8:30 pm Monday-Thursday . 8 am to 6 pm Friday . 8 am to 4 pm Saturday-Sunday   Your e-visit answers were reviewed by a board certified advanced clinical practitioner to complete your personal care plan.  Depending on the condition, your plan could have included both over the counter or prescription medications.  You will get an e-mail in the next two days asking about your experience.  I hope that your e-visit has been valuable and will speed your recovery . Thank you for choosing an e-visit.

## 2014-06-12 ENCOUNTER — Encounter: Payer: Self-pay | Admitting: Family Medicine

## 2014-06-17 ENCOUNTER — Telehealth: Payer: Self-pay | Admitting: Family Medicine

## 2014-06-17 MED ORDER — ESCITALOPRAM OXALATE 10 MG PO TABS: 10.0000 mg | ORAL_TABLET | Freq: Every day | ORAL | Status: DC

## 2014-06-17 NOTE — Telephone Encounter (Signed)
lexapro trial

## 2014-07-19 ENCOUNTER — Encounter: Payer: Self-pay | Admitting: Family Medicine

## 2014-07-19 NOTE — Telephone Encounter (Signed)
I sent pt a mychart message saying Dr. Glori Bickers will not be back till 07/23/14 and she may want to schedule an appt with a provider or wait until next week for a response from Dr. Glori Bickers

## 2014-07-22 ENCOUNTER — Telehealth: Payer: Self-pay | Admitting: Family

## 2014-07-22 DIAGNOSIS — N939 Abnormal uterine and vaginal bleeding, unspecified: Secondary | ICD-10-CM

## 2014-07-22 DIAGNOSIS — R1084 Generalized abdominal pain: Secondary | ICD-10-CM

## 2014-07-22 NOTE — Progress Notes (Signed)
Based on what you shared with me it looks like you have a serious condition that should be evaluated in a face to face office visit.  If you are having a true medical emergency please call 911.  If you need an urgent face to face visit, [place] has four urgent care centers for your convenience.  . [facility] a Provider at this Location  [address], [place] [address] . 8 am to 8 pm Monday-Friday . 9 am to 7 pm Saturday-Sunday  . [facility] at [place] a Provider at this Location  [address] [place], [place] [address] . 8 am to 8 pm Monday-Friday . 9 am to 6 pm Saturday . 11 am to 6 pm Sunday   . [facility] at [place] Get Driving Directions  [address] [place], [place] [address] . 8 am to 8 pm Monday-Friday . 9 am to 4 pm Saturday-Sunday   . [facility] (a walk in primary care provider)  [place] a Provider at this Location  [place], [place] [address] . 8 am to 8:30 pm Monday-Thursday . 8 am to 6 pm Friday . 8 am to 4 pm Saturday-Sunday   Your e-visit answers were reviewed by a board certified advanced clinical practitioner to complete your personal care plan.  Depending on the condition, your plan could have included both over the counter or prescription medications.  You will get an e-mail in the next two days asking about your experience.  I hope that your e-visit has been valuable and will speed your recovery . Thank you for choosing an e-visit.

## 2014-07-25 ENCOUNTER — Encounter: Payer: Self-pay | Admitting: Family Medicine

## 2014-08-09 ENCOUNTER — Ambulatory Visit: Payer: Managed Care, Other (non HMO) | Admitting: Obstetrics & Gynecology

## 2014-08-12 ENCOUNTER — Ambulatory Visit (INDEPENDENT_AMBULATORY_CARE_PROVIDER_SITE_OTHER): Payer: Managed Care, Other (non HMO) | Admitting: Obstetrics and Gynecology

## 2014-08-12 ENCOUNTER — Encounter: Payer: Self-pay | Admitting: Obstetrics and Gynecology

## 2014-08-12 VITALS — BP 113/74 | HR 71 | Resp 16 | Ht 64.0 in | Wt 172.0 lb

## 2014-08-12 DIAGNOSIS — R102 Pelvic and perineal pain: Secondary | ICD-10-CM

## 2014-08-12 NOTE — Progress Notes (Signed)
   Subjective:    Patient ID: [name], female    DOB: 1980/08/30, 35 y.o.   MRN: [id number]  HPI  34 yo G2P1102 with LMP 07/21/2014 presenting today for the evaluation of dyspareunia and dysmenorrhea. Patient has a history of endometriosis and reports that her pain has been getting worst. She had a cyst on her right ovary in November 2015 which is where the pain is located. She feels that her pain has been getting worst over the past 2 months. She reports monthly menses lasting 5 days which are heavier in flow. Patient reports controlling her pain with massage of her abdomen with essential oils. She doesn't like taking pain medications.   Past Medical History  Diagnosis Date  . GERD (gastroesophageal reflux disease)   . Endometriosis   . Fatigue   . Kidney stone   . Vitamin B12 deficiency   . Migraine   . Personal history of colonic polyps 06/01/2006    sessil serrated adenoma  . Family history of malignant neoplasm of gastrointestinal tract   . Fibromyalgia   . ADD (attention deficit disorder)    Past Surgical History  Procedure Laterality Date  . Cholecystectomy    . Ovarian cyst surgery    . Laparoscopy  11/2004    endometriosis  . Breast surgery  2004    breast reduction  . Tubal ligation  2008  . Wisdom tooth extraction      with four other teeth as well   Family History  Problem Relation Age of Onset  . Cervical cancer Mother 20  . Hypertension Mother   . Diabetes Mother   . Kidney cancer Mother 86  . Colon polyps Sister   . Kidney failure Brother 42    s/p transplant ?due to PCKD or renal stones  . Breast cancer Maternal Aunt   . Breast cancer Maternal Grandmother   . Colon cancer Paternal Grandfather   . Wilson's disease Father     liver transplant  . Ulcerative colitis Father   . Esophageal cancer Neg Hx   . Stomach cancer Neg Hx   . Rectal cancer Neg Hx    History  Substance Use Topics  . Smoking status: Former Smoker -- 0.25 packs/day    Quit  date: 01/25/2005  . Smokeless tobacco: Never Used  . Alcohol Use: Yes     Comment: rare     Review of Systems See pertinent in HPI     Objective:   Physical Exam  GENERAL: Well-developed, well-nourished female in no acute distress.  ABDOMEN: Soft, nontender, nondistended. No organomegaly. PELVIC: Normal external female genitalia. Vagina is pink and rugated.  Normal discharge. Normal appearing cervix. Uterus is normal in size, tenderness to palpation. No adnexal mass or tenderness. EXTREMITIES: No cyanosis, clubbing, or edema, 2+ distal pulses.       Assessment & Plan:  34 yo with pelvic pain - Will order pelvic ultrasound to rule out ovarian cyst or fibroid uterus - Discussed medical management of endometriosis with birth control- patient remains undecided at this time - Patient will be contacted with a any abnormal results

## 2014-08-12 NOTE — Patient Instructions (Signed)
Contraception Choices Contraception (birth control) is the use of any methods or devices to prevent pregnancy. Below are some methods to help avoid pregnancy. HORMONAL METHODS   Contraceptive implant. This is a thin, plastic tube containing progesterone hormone. It does not contain estrogen hormone. Your health care provider inserts the tube in the inner part of the upper arm. The tube can remain in place for up to 3 years. After 3 years, the implant must be removed. The implant prevents the ovaries from releasing an egg (ovulation), thickens the cervical mucus to prevent sperm from entering the uterus, and thins the lining of the inside of the uterus.  Progesterone-only injections. These injections are given every 3 months by your health care provider to prevent pregnancy. This synthetic progesterone hormone stops the ovaries from releasing eggs. It also thickens cervical mucus and changes the uterine lining. This makes it harder for sperm to survive in the uterus.  Birth control pills. These pills contain estrogen and progesterone hormone. They work by preventing the ovaries from releasing eggs (ovulation). They also cause the cervical mucus to thicken, preventing the sperm from entering the uterus. Birth control pills are prescribed by a health care provider.Birth control pills can also be used to treat heavy periods.  Minipill. This type of birth control pill contains only the progesterone hormone. They are taken every day of each month and must be prescribed by your health care provider.  Birth control patch. The patch contains hormones similar to those in birth control pills. It must be changed once a week and is prescribed by a health care provider.  Vaginal ring. The ring contains hormones similar to those in birth control pills. It is left in the vagina for 3 weeks, removed for 1 week, and then a new one is put back in place. The patient must be comfortable inserting and removing the ring  from the vagina.A health care provider's prescription is necessary.  Emergency contraception. Emergency contraceptives prevent pregnancy after unprotected sexual intercourse. This pill can be taken right after sex or up to 5 days after unprotected sex. It is most effective the sooner you take the pills after having sexual intercourse. Most emergency contraceptive pills are available without a prescription. Check with your pharmacist. Do not use emergency contraception as your only form of birth control. BARRIER METHODS   Female condom. This is a thin sheath (latex or rubber) that is worn over the penis during sexual intercourse. It can be used with spermicide to increase effectiveness.  Female condom. This is a soft, loose-fitting sheath that is put into the vagina before sexual intercourse.  Diaphragm. This is a soft, latex, dome-shaped barrier that must be fitted by a health care provider. It is inserted into the vagina, along with a spermicidal jelly. It is inserted before intercourse. The diaphragm should be left in the vagina for 6 to 8 hours after intercourse.  Cervical cap. This is a round, soft, latex or plastic cup that fits over the cervix and must be fitted by a health care provider. The cap can be left in place for up to 48 hours after intercourse.  Sponge. This is a soft, circular piece of polyurethane foam. The sponge has spermicide in it. It is inserted into the vagina after wetting it and before sexual intercourse.  Spermicides. These are chemicals that kill or block sperm from entering the cervix and uterus. They come in the form of creams, jellies, suppositories, foam, or tablets. They do not require a   prescription. They are inserted into the vagina with an applicator before having sexual intercourse. The process must be repeated every time you have sexual intercourse. INTRAUTERINE CONTRACEPTION  Intrauterine device (IUD). This is a T-shaped device that is put in a woman's uterus  during a menstrual period to prevent pregnancy. There are 2 types:  Copper IUD. This type of IUD is wrapped in copper wire and is placed inside the uterus. Copper makes the uterus and fallopian tubes produce a fluid that kills sperm. It can stay in place for 10 years.  Hormone IUD. This type of IUD contains the hormone progestin (synthetic progesterone). The hormone thickens the cervical mucus and prevents sperm from entering the uterus, and it also thins the uterine lining to prevent implantation of a fertilized egg. The hormone can weaken or kill the sperm that get into the uterus. It can stay in place for 3-5 years, depending on which type of IUD is used. PERMANENT METHODS OF CONTRACEPTION  Female tubal ligation. This is when the woman's fallopian tubes are surgically sealed, tied, or blocked to prevent the egg from traveling to the uterus.  Hysteroscopic sterilization. This involves placing a small coil or insert into each fallopian tube. Your doctor uses a technique called hysteroscopy to do the procedure. The device causes scar tissue to form. This results in permanent blockage of the fallopian tubes, so the sperm cannot fertilize the egg. It takes about 3 months after the procedure for the tubes to become blocked. You must use another form of birth control for these 3 months.  Female sterilization. This is when the female has the tubes that carry sperm tied off (vasectomy).This blocks sperm from entering the vagina during sexual intercourse. After the procedure, the man can still ejaculate fluid (semen). NATURAL PLANNING METHODS  Natural family planning. This is not having sexual intercourse or using a barrier method (condom, diaphragm, cervical cap) on days the woman could become pregnant.  Calendar method. This is keeping track of the length of each menstrual cycle and identifying when you are fertile.  Ovulation method. This is avoiding sexual intercourse during ovulation.  Symptothermal  method. This is avoiding sexual intercourse during ovulation, using a thermometer and ovulation symptoms.  Post-ovulation method. This is timing sexual intercourse after you have ovulated. Regardless of which type or method of contraception you choose, it is important that you use condoms to protect against the transmission of sexually transmitted infections (STIs). Talk with your health care provider about which form of contraception is most appropriate for you. Document Released: 01/11/2005 Document Revised: 01/16/2013 Document Reviewed: 07/06/2012 ExitCare Patient Information 2015 [employer]. This information is not intended to replace advice given to you by your health care provider. Make sure you discuss any questions you have with your health care provider.  

## 2014-08-14 ENCOUNTER — Ambulatory Visit (HOSPITAL_COMMUNITY)
Admission: RE | Admit: 2014-08-14 | Discharge: 2014-08-14 | Disposition: A | Discharge status: Home / Self Care | Payer: Managed Care, Other (non HMO) | Source: Ambulatory Visit | Attending: Obstetrics and Gynecology | Admitting: Obstetrics and Gynecology

## 2014-08-14 DIAGNOSIS — N939 Abnormal uterine and vaginal bleeding, unspecified: Secondary | ICD-10-CM | POA: Diagnosis not present

## 2014-08-14 DIAGNOSIS — R102 Pelvic and perineal pain: Secondary | ICD-10-CM

## 2014-08-15 ENCOUNTER — Encounter: Payer: Self-pay | Admitting: Obstetrics and Gynecology

## 2014-08-18 ENCOUNTER — Encounter: Payer: Self-pay | Admitting: Family Medicine

## 2014-08-19 ENCOUNTER — Encounter: Payer: Self-pay | Admitting: *Deleted

## 2014-08-19 ENCOUNTER — Encounter: Payer: Self-pay | Admitting: Family Medicine

## 2014-08-20 ENCOUNTER — Telehealth: Payer: Self-pay | Admitting: *Deleted

## 2014-08-20 NOTE — Telephone Encounter (Signed)
-----   Message from Mora Bellman, MD sent at 08/18/2014 11:14 AM EDT ----- Please inform patient of the presence of 2 small cysts on the left ovary which contains a small amount of blood in them. The plan is to repeat ultrasound in 3 months (please schedule follow up ultrasound). In the meantime, pain management with heating pad and ibuprofen  Thanks  [employer]

## 2014-08-20 NOTE — Telephone Encounter (Signed)
Pt aware of results and plan.  Patient is unhappy with her options.  She says the pain is constant and Ibuprofen is not working.  She called and our receptionist has scheduled her with Dr. Kennon Rounds to discuss the pain and possible surgery because patient says that Dr. Kennon Rounds had offered her a hysterectomy in the past.   I told patient she should stick with Dr. Elly Modena who she has been seeing and she wanted to see another doctor within the practice.

## 2014-08-26 ENCOUNTER — Encounter: Payer: Self-pay | Admitting: Family Medicine

## 2014-08-28 ENCOUNTER — Telehealth: Payer: Self-pay | Admitting: Family Medicine

## 2014-08-28 MED ORDER — VENLAFAXINE HCL ER 37.5 MG PO CP24: 37.5000 mg | ORAL_CAPSULE | Freq: Every day | ORAL | Status: DC

## 2014-08-28 NOTE — Telephone Encounter (Signed)
effexor to [place] [facility]  See [employer] note

## 2014-09-01 ENCOUNTER — Encounter: Payer: Self-pay | Admitting: Family Medicine

## 2014-09-03 ENCOUNTER — Ambulatory Visit: Payer: Managed Care, Other (non HMO) | Admitting: Family Medicine

## 2014-09-03 DIAGNOSIS — R102 Pelvic and perineal pain: Secondary | ICD-10-CM

## 2014-09-04 ENCOUNTER — Ambulatory Visit: Payer: Self-pay | Admitting: Family Medicine

## 2014-10-03 ENCOUNTER — Telehealth: Payer: Self-pay | Admitting: Family Medicine

## 2014-10-03 ENCOUNTER — Encounter: Payer: Self-pay | Admitting: Family Medicine

## 2014-10-03 MED ORDER — VENLAFAXINE HCL ER 75 MG PO CP24: 75.0000 mg | ORAL_CAPSULE | Freq: Every day | ORAL | Status: DC

## 2014-10-03 NOTE — Telephone Encounter (Signed)
Inc effexor xr to 73

## 2014-10-18 ENCOUNTER — Encounter: Payer: Self-pay | Admitting: Family Medicine

## 2014-10-18 ENCOUNTER — Ambulatory Visit (INDEPENDENT_AMBULATORY_CARE_PROVIDER_SITE_OTHER): Payer: Managed Care, Other (non HMO) | Admitting: Family Medicine

## 2014-10-18 VITALS — BP 128/80 | HR 70 | Temp 98.3°F | Ht 64.0 in | Wt 168.5 lb

## 2014-10-18 DIAGNOSIS — J019 Acute sinusitis, unspecified: Secondary | ICD-10-CM | POA: Diagnosis not present

## 2014-10-18 MED ORDER — DOXYCYCLINE HYCLATE 100 MG PO TABS: 100.0000 mg | ORAL_TABLET | Freq: Two times a day (BID) | ORAL | Status: DC

## 2014-10-18 MED ORDER — FLUTICASONE PROPIONATE 50 MCG/ACT NA SUSP: 2.0000 | Freq: Every day | NASAL | Status: DC

## 2014-10-18 NOTE — Assessment & Plan Note (Signed)
Treating with doxycycline and Flonase. Follow-up as needed.

## 2014-10-18 NOTE — Patient Instructions (Signed)
Take the antibiotic as prescribed.  Use the flonase daily as prescribed.  Follow up with Dr. Glori Bickers as indicated.  Take care  Dr. Lacinda Axon

## 2014-10-18 NOTE — Progress Notes (Signed)
Pre visit review using our clinic review tool, if applicable. No additional management support is needed unless otherwise documented below in the visit note. 

## 2014-10-18 NOTE — Progress Notes (Signed)
   Subjective:  Patient ID: [name], female    DOB: [birth year]  Age: 34 y.o. MRN: [id number]  CC: Sinus pain/pressure  HPI: 34 year old female presents to clinic today for an acute visit complaints of sinus pain/pressure.  Patient reports a two-week history of the above complaints. She reports associated headache and fatigue. She has been using over-the-counter nose spray and Sudafed with little relief. No known exacerbating or relieving factors. She has had recent fever; last night temperature was 101.6. She denies any recent sick contacts.  Social Hx   Social History   Social History  . Marital Status: Married    Spouse Name: N/A  . Number of Children: 2  . Years of Education: N/A   Occupational History  . accounting    Social History Main Topics  . Smoking status: Former Smoker -- 0.25 packs/day    Quit date: 01/25/2005  . Smokeless tobacco: Never Used  . Alcohol Use: Yes     Comment: rare  . Drug Use: No  . Sexual Activity: Yes    Birth Control/ Protection: Surgical     Comment: tubal ligation   Other Topics Concern  . None   Social History Narrative   Lives with husband and son, 1 dog   Occupation: Sales promotion account executive   Activity: yoga, pilates, walks 3x/wk 2 mi   Diet: gluten free diet, avoids fried foods, good water 32-64 oz, fruits/vegetables daily   Review of Systems  Constitutional: Positive for fever and fatigue.  HENT: Positive for sinus pressure.   Neurological: Positive for headaches.   Objective:  BP 128/80 mmHg  Pulse 70  Temp(Src) 98.3 F (36.8 C) (Oral)  Ht 5\' 4"  (1.626 m)  Wt 168 lb 8 oz (76.431 kg)  BMI 28.91 kg/m2  SpO2 96%  LMP   BP/Weight 10/18/2014 08/12/2014 7/47/1855  Systolic BP 015 868 257  Diastolic BP 80 74 68  Wt. (Lbs) 168.5 172 172.75  BMI 28.91 29.51 30.12   Physical Exam  Constitutional: She is oriented to person, place, and time. She appears well-developed and well-nourished. No distress.  HENT:  Head:  Normocephalic and atraumatic.  Mouth/Throat: Oropharynx is clear and moist. No oropharyngeal exudate.  Normal TMs bilaterally. Maxillary and frontal sinuses tender to palpation.  Cardiovascular: Normal rate and regular rhythm.   No murmur heard. Pulmonary/Chest: Effort normal and breath sounds normal. No respiratory distress. She has no wheezes. She has no rales.  Neurological: She is alert and oriented to person, place, and time.  Psychiatric:  Flat affect.  Vitals reviewed.  Assessment & Plan:   Problem List Items Addressed This Visit    Acute sinusitis - Primary    Treating with doxycycline and Flonase. Follow-up as needed.      Relevant Medications   doxycycline (VIBRA-TABS) 100 MG tablet   fluticasone (FLONASE) 50 MCG/ACT nasal spray     Follow-up: PRN.  Thersa Salt, DO

## 2014-12-06 ENCOUNTER — Encounter: Payer: Self-pay | Admitting: Family Medicine

## 2014-12-09 ENCOUNTER — Telehealth: Payer: Self-pay | Admitting: Family Medicine

## 2014-12-09 NOTE — Telephone Encounter (Signed)
Will see her then 

## 2014-12-09 NOTE — Telephone Encounter (Signed)
See below my chart message Pt made appointment for 11/16 is it ok to wait  Appointment For: [facility] ([id number])   Visit Type: MYCHART OFFICE VISIT (1064)      12/11/2014  5:15 PM 15 mins. Abner Greenspan, MD     [facility]      Patient Comments:   Office Visit   Leg pain and numbness

## 2014-12-11 ENCOUNTER — Encounter: Payer: Self-pay | Admitting: Family Medicine

## 2014-12-11 ENCOUNTER — Ambulatory Visit (INDEPENDENT_AMBULATORY_CARE_PROVIDER_SITE_OTHER): Payer: Managed Care, Other (non HMO) | Admitting: Family Medicine

## 2014-12-11 VITALS — BP 122/70 | HR 76 | Temp 98.0°F | Wt 167.0 lb

## 2014-12-11 DIAGNOSIS — E538 Deficiency of other specified B group vitamins: Secondary | ICD-10-CM

## 2014-12-11 DIAGNOSIS — M797 Fibromyalgia: Secondary | ICD-10-CM

## 2014-12-11 DIAGNOSIS — R5382 Chronic fatigue, unspecified: Secondary | ICD-10-CM

## 2014-12-11 DIAGNOSIS — R202 Paresthesia of skin: Secondary | ICD-10-CM | POA: Diagnosis not present

## 2014-12-11 NOTE — Patient Instructions (Signed)
Lab today Let's see what they look like for thyroid and other things  Keep exercising We may consider Lyrica for pain and numbness in the future

## 2014-12-11 NOTE — Progress Notes (Signed)
Pre visit review using our clinic review tool, if applicable. No additional management support is needed unless otherwise documented below in the visit note. 

## 2014-12-11 NOTE — Progress Notes (Signed)
Subjective:    Patient ID: [name], female    DOB: May 08, 1980, 34 y.o.   MRN: [id number]  HPI Here with pain and numbness  Legs hurt all the time- wakes her up in the middle of the night  Tries hot/cold compresses Tries yoga and stretching    Hands and feet go numb often   Thirsty- but drinks lots of water (also urinating more)  Lab Results  Component Value Date   HGBA1C 5.1 08/31/2012    Skin is dry -more on fingers and hands  Not in water a lot/ avoids hot water (tepid) and uses gentle cleansers  Also hypoallergenic lotion /moisturizer   Lab Results  Component Value Date   TSH 2.63 05/16/2014  on levothyroxine - energy level is much better -also less aches and pains   Lab Results  Component Value Date   VITAMINB12 426 05/16/2014  has not had B12 in a while   Is fatigued chronically as well -but improved effexor helps a lot ! Better sleep also    Wt is stable  Vitals are stable   Periods are better with the pill   Numbness-fingers and toes  Sometimes whole foot   occ cramps in feet   Patient Active Problem List   Diagnosis Date Noted  . Paresthesia 12/11/2014  . Health maintenance examination 05/16/2014  . Paresthesias 05/16/2014  . Hematuria 05/16/2014  . RUQ abdominal pain 12/05/2013  . Unsteadiness on feet 11/28/2013  . Anxiety disorder 03/19/2013  . Ovarian cyst 03/07/2013  . Pelvic pain in female 01/09/2013  . Heavy menses 01/03/2013  . ADD (attention deficit disorder) 02/29/2012  . Fibromyalgia 03/26/2011  . Myofascial pain 03/19/2011  . Gluten intolerance 01/22/2011  . Obesity 11/09/2010  . Fatigue 05/18/2010  . FIBROCYSTIC BREAST DISEASE 03/27/2010  . CAVERNOUS HEMANGIOMA, LIVER 12/03/2009  . PERSONAL HISTORY OF COLONIC POLYPS 12/03/2009  . ALLERGIC RHINITIS 03/09/2008  . URINARY FREQUENCY, CHRONIC 01/31/2007  . B12 deficiency 10/28/2006  . GERD 04/27/2006  . Endometriosis 04/27/2006  . RENAL CALCULUS, HX OF 04/27/2006  .  MIGRAINES, HX OF 04/27/2006   Past Medical History  Diagnosis Date  . GERD (gastroesophageal reflux disease)   . Endometriosis   . Fatigue   . Kidney stone   . Vitamin B12 deficiency   . Migraine   . Personal history of colonic polyps 06/01/2006    sessil serrated adenoma  . Family history of malignant neoplasm of gastrointestinal tract   . Fibromyalgia   . ADD (attention deficit disorder)    Past Surgical History  Procedure Laterality Date  . Cholecystectomy    . Ovarian cyst surgery    . Laparoscopy  11/2004    endometriosis  . Breast surgery  2004    breast reduction  . Tubal ligation  2008  . Wisdom tooth extraction      with four other teeth as well   Social History  Substance Use Topics  . Smoking status: Former Smoker -- 0.25 packs/day    Quit date: 01/25/2005  . Smokeless tobacco: Never Used  . Alcohol Use: Yes     Comment: rare   Family History  Problem Relation Age of Onset  . Cervical cancer Mother 84  . Hypertension Mother   . Diabetes Mother   . Kidney cancer Mother 51  . Colon polyps Sister   . Kidney failure Brother 47    s/p transplant ?due to PCKD or renal stones  . Breast cancer Maternal Aunt   .  Breast cancer Maternal Grandmother   . Colon cancer Paternal Grandfather   . Wilson's disease Father     liver transplant  . Ulcerative colitis Father   . Esophageal cancer Neg Hx   . Stomach cancer Neg Hx   . Rectal cancer Neg Hx    Allergies  Allergen Reactions  . Strawberry Extract Anaphylaxis and Hives  . Clindamycin     REACTION: hives, angioedema  . Gabapentin     Dizzy/ worse headache  . Penicillins     REACTION: hives, angioedema   Current Outpatient Prescriptions on File Prior to Visit  Medication Sig Dispense Refill  . levothyroxine (SYNTHROID, LEVOTHROID) 50 MCG tablet   11  . TRI-SPRINTEC 0.18/0.215/0.25 MG-35 MCG tablet   4  . venlafaxine XR (EFFEXOR-XR) 75 MG 24 hr capsule Take 1 capsule (75 mg total) by mouth daily with  breakfast. 30 capsule 11  . cholecalciferol (VITAMIN D) 1000 UNITS tablet Take 1,000 Units by mouth daily.    . fluticasone (FLONASE) 50 MCG/ACT nasal spray Place 2 sprays into both nostrils daily. (Patient not taking: Reported on 12/11/2014) 16 g 6  . IODINE, KELP, PO Take 1,000 mcg by mouth daily.     No current facility-administered medications on file prior to visit.     Review of Systems Review of Systems  Constitutional: Negative for fever, appetite change,  and unexpected weight change.  ENT neg for sinus pain or st  Eyes: Negative for pain and visual disturbance.  Respiratory: Negative for cough and shortness of breath.   Cardiovascular: Negative for cp or palpitations    Gastrointestinal: Negative for nausea, diarrhea and constipation.  Genitourinary: Negative for urgency and pos for frequency along with increased thirst  Skin: Negative for pallor or rash   MSK pos for leg pain and general myofascial pain , neg for joint swelling pos for nocturnal cramps  Neurological: Negative for weakness, light-headedness, and pos for headaches.  Hematological: Negative for adenopathy. Does not bruise/bleed easily.  Psychiatric/Behavioral: Negative for dysphoric mood. The patient is  nervous/anxious.         Objective:   Physical Exam  Constitutional: She appears well-developed and well-nourished. No distress.  overwt and well app  HENT:  Head: Normocephalic and atraumatic.  Mouth/Throat: Oropharynx is clear and moist.  Eyes: Conjunctivae and EOM are normal. Pupils are equal, round, and reactive to light. Right eye exhibits no discharge. Left eye exhibits no discharge. No scleral icterus.  Neck: Normal range of motion. Neck supple. No JVD present. Carotid bruit is not present. No thyromegaly present.  Cardiovascular: Normal rate, regular rhythm, normal heart sounds and intact distal pulses.  Exam reveals no gallop.   Pulmonary/Chest: Effort normal and breath sounds normal. No  respiratory distress. She has no wheezes. She has no rales.  No crackles  Abdominal: Soft. Bowel sounds are normal. She exhibits no distension, no abdominal bruit and no mass. There is no tenderness.  Musculoskeletal: She exhibits tenderness. She exhibits no edema.  Some myofacial tenderness worse in LEs  No spinal tenderness No joint swelling   Lymphadenopathy:    She has no cervical adenopathy.  Neurological: She is alert. She has normal reflexes. She displays no atrophy and no tremor. No cranial nerve deficit or sensory deficit. She exhibits normal muscle tone. Coordination and gait normal.  No sensory deficits on exam today  Skin: Skin is warm and dry. No rash noted.  Psychiatric: She has a normal mood and affect.  Not tearful  Assessment & Plan:   Problem List Items Addressed This Visit      Digestive   B12 deficiency    Check level Pt has neuropathy symptoms and leg/foot cramping as well as fatigue       Relevant Orders   Vitamin B12 (Completed)     Musculoskeletal and Integument   Fibromyalgia - Primary    Current medicines are helping - but still a lot of leg breakthrough pain  Intol of gabapentin - perhaps lyrica would be an option  Will check labs first         Other   Fatigue    Improved with thyroid and B12 tx - but still problematic Also fibromyalgia Lab today  Enc continued exercise-doing great with that       Relevant Orders   CBC with Differential/Platelet (Completed)   Comprehensive metabolic panel (Completed)   TSH (Completed)   Vitamin B12 (Completed)   Paresthesia    Hands and feet  Pt has fibromyalgia and a lot of chronic issues  Reassuring exam Check lab today incl tsh and B12 - tx if needed  If all nl consider lyrica (she does not tolerate gabapentin)      Relevant Orders   TSH (Completed)   Vitamin B12 (Completed)

## 2014-12-12 LAB — CBC WITH DIFFERENTIAL/PLATELET
BASOS ABS: 0.1 10*3/uL (ref 0.0–0.1)
Basophils Relative: 1.3 % (ref 0.0–3.0)
EOS ABS: 0 10*3/uL (ref 0.0–0.7)
Eosinophils Relative: 0.5 % (ref 0.0–5.0)
HEMATOCRIT: 36.6 % (ref 36.0–46.0)
HEMOGLOBIN: 12.3 g/dL (ref 12.0–15.0)
LYMPHS PCT: 27.7 % (ref 12.0–46.0)
Lymphs Abs: 2 10*3/uL (ref 0.7–4.0)
MCHC: 33.6 g/dL (ref 30.0–36.0)
MCV: 87.1 fl (ref 78.0–100.0)
Monocytes Absolute: 0.8 10*3/uL (ref 0.1–1.0)
Monocytes Relative: 11.1 % (ref 3.0–12.0)
NEUTROS ABS: 4.3 10*3/uL (ref 1.4–7.7)
Neutrophils Relative %: 59.4 % (ref 43.0–77.0)
Platelets: 247 10*3/uL (ref 150.0–400.0)
RBC: 4.2 Mil/uL (ref 3.87–5.11)
RDW: 13.6 % (ref 11.5–15.5)
WBC: 7.3 10*3/uL (ref 4.0–10.5)

## 2014-12-12 LAB — COMPREHENSIVE METABOLIC PANEL
ALT: 11 U/L (ref 0–35)
AST: 13 U/L (ref 0–37)
Albumin: 4 g/dL (ref 3.5–5.2)
Alkaline Phosphatase: 39 U/L (ref 39–117)
BILIRUBIN TOTAL: 0.4 mg/dL (ref 0.2–1.2)
BUN: 10 mg/dL (ref 6–23)
CALCIUM: 9.2 mg/dL (ref 8.4–10.5)
CO2: 26 meq/L (ref 19–32)
CREATININE: 0.71 mg/dL (ref 0.40–1.20)
Chloride: 102 mEq/L (ref 96–112)
GFR: 98.57 mL/min (ref >60.00)
Glucose, Bld: 81 mg/dL (ref 70–99)
Potassium: 4.2 mEq/L (ref 3.5–5.1)
Sodium: 136 mEq/L (ref 135–145)
TOTAL PROTEIN: 7 g/dL (ref 6.0–8.3)

## 2014-12-12 LAB — TSH: TSH: 0.93 u[IU]/mL (ref 0.35–4.50)

## 2014-12-12 LAB — VITAMIN B12: VITAMIN B 12: 188 pg/mL — AB (ref 211–911)

## 2014-12-12 NOTE — Assessment & Plan Note (Signed)
Improved with thyroid and B12 tx - but still problematic Also fibromyalgia Lab today  Enc continued exercise-doing great with that

## 2014-12-12 NOTE — Assessment & Plan Note (Signed)
Check level Pt has neuropathy symptoms and leg/foot cramping as well as fatigue

## 2014-12-12 NOTE — Assessment & Plan Note (Signed)
Current medicines are helping - but still a lot of leg breakthrough pain  Intol of gabapentin - perhaps lyrica would be an option  Will check labs first

## 2014-12-12 NOTE — Assessment & Plan Note (Signed)
Hands and feet  Pt has fibromyalgia and a lot of chronic issues  Reassuring exam Check lab today incl tsh and B12 - tx if needed  If all nl consider lyrica (she does not tolerate gabapentin)

## 2014-12-24 ENCOUNTER — Other Ambulatory Visit: Payer: Self-pay | Admitting: Family Medicine

## 2014-12-24 ENCOUNTER — Other Ambulatory Visit: Payer: Self-pay | Admitting: *Deleted

## 2014-12-24 MED ORDER — CYANOCOBALAMIN 1000 MCG/ML IJ SOLN: 1000.0000 ug | INTRAMUSCULAR | Status: DC

## 2014-12-25 ENCOUNTER — Encounter: Payer: Self-pay | Admitting: Family Medicine

## 2015-01-29 ENCOUNTER — Encounter: Payer: Self-pay | Admitting: Family Medicine

## 2015-01-29 ENCOUNTER — Ambulatory Visit (INDEPENDENT_AMBULATORY_CARE_PROVIDER_SITE_OTHER): Payer: Managed Care, Other (non HMO) | Admitting: Family Medicine

## 2015-01-29 VITALS — BP 120/72 | HR 71 | Temp 97.7°F | Ht 64.0 in | Wt 171.8 lb

## 2015-01-29 DIAGNOSIS — M797 Fibromyalgia: Secondary | ICD-10-CM | POA: Diagnosis not present

## 2015-01-29 DIAGNOSIS — R202 Paresthesia of skin: Secondary | ICD-10-CM

## 2015-01-29 DIAGNOSIS — E538 Deficiency of other specified B group vitamins: Secondary | ICD-10-CM

## 2015-01-29 DIAGNOSIS — L309 Dermatitis, unspecified: Secondary | ICD-10-CM | POA: Insufficient documentation

## 2015-01-29 NOTE — Progress Notes (Signed)
Pre visit review using our clinic review tool, if applicable. No additional management support is needed unless otherwise documented below in the visit note. 

## 2015-01-29 NOTE — Progress Notes (Signed)
Subjective:    Patient ID: [name], female    DOB: 1980-08-07, 35 y.o.   MRN: [id number]  HPI Here for f/u of chronic problems  Had to go to UC last week for viral uri Did not end up needing an abx   Sinuses are very dry - for 2 months and it gives her a headache 2-3 times per week  Worse on sundays (sleeps later)  Uses simply saline  Does not use flonase out of season - may need to start it back   Has been getting her b12 shots weekly- but none since xmas  Lab Results  Component Value Date   VITAMINB12 188* 12/11/2014   Still having numbness in hands and feet - not as bad but still there  Had 4 shots in 4 weeks   Needs level checked today  Fibromyalgia - does not want to try lyrica yet- but may consider lately Still has a lot of achey joints  Sleep is not the best - hard to sleep all the way through the night  Some sweats at night and stays hot   Lab Results  Component Value Date   TSH 0.93 12/11/2014     Has more energy with her current medications   Patient Active Problem List   Diagnosis Date Noted  . Eczema 01/29/2015  . Paresthesia 12/11/2014  . Health maintenance examination 05/16/2014  . Paresthesias 05/16/2014  . Hematuria 05/16/2014  . RUQ abdominal pain 12/05/2013  . Unsteadiness on feet 11/28/2013  . Anxiety disorder 03/19/2013  . Ovarian cyst 03/07/2013  . Pelvic pain in female 01/09/2013  . Heavy menses 01/03/2013  . ADD (attention deficit disorder) 02/29/2012  . Fibromyalgia 03/26/2011  . Myofascial pain 03/19/2011  . Gluten intolerance 01/22/2011  . Obesity 11/09/2010  . Fatigue 05/18/2010  . FIBROCYSTIC BREAST DISEASE 03/27/2010  . CAVERNOUS HEMANGIOMA, LIVER 12/03/2009  . PERSONAL HISTORY OF COLONIC POLYPS 12/03/2009  . ALLERGIC RHINITIS 03/09/2008  . URINARY FREQUENCY, CHRONIC 01/31/2007  . B12 deficiency 10/28/2006  . GERD 04/27/2006  . Endometriosis 04/27/2006  . RENAL CALCULUS, HX OF 04/27/2006  . MIGRAINES, HX OF  04/27/2006   Past Medical History  Diagnosis Date  . GERD (gastroesophageal reflux disease)   . Endometriosis   . Fatigue   . Kidney stone   . Vitamin B12 deficiency   . Migraine   . Personal history of colonic polyps 06/01/2006    sessil serrated adenoma  . Family history of malignant neoplasm of gastrointestinal tract   . Fibromyalgia   . ADD (attention deficit disorder)    Past Surgical History  Procedure Laterality Date  . Cholecystectomy    . Ovarian cyst surgery    . Laparoscopy  11/2004    endometriosis  . Breast surgery  2004    breast reduction  . Tubal ligation  2008  . Wisdom tooth extraction      with four other teeth as well   Social History  Substance Use Topics  . Smoking status: Former Smoker -- 0.25 packs/day    Quit date: 01/25/2005  . Smokeless tobacco: Never Used  . Alcohol Use: Yes     Comment: rare   Family History  Problem Relation Age of Onset  . Cervical cancer Mother 48  . Hypertension Mother   . Diabetes Mother   . Kidney cancer Mother 29  . Colon polyps Sister   . Kidney failure Brother 37    s/p transplant ?due to PCKD or  renal stones  . Breast cancer Maternal Aunt   . Breast cancer Maternal Grandmother   . Colon cancer Paternal Grandfather   . Wilson's disease Father     liver transplant  . Ulcerative colitis Father   . Esophageal cancer Neg Hx   . Stomach cancer Neg Hx   . Rectal cancer Neg Hx    Allergies  Allergen Reactions  . Strawberry Extract Anaphylaxis and Hives  . Clindamycin     REACTION: hives, angioedema  . Gabapentin     Dizzy/ worse headache  . Penicillins     REACTION: hives, angioedema   Current Outpatient Prescriptions on File Prior to Visit  Medication Sig Dispense Refill  . cholecalciferol (VITAMIN D) 1000 UNITS tablet Take 1,000 Units by mouth daily. Reported on 01/29/2015    . cyanocobalamin (,VITAMIN B-12,) 1000 MCG/ML injection Inject 1 mL (1,000 mcg total) into the muscle once a week. 1 mL 4  .  IODINE, KELP, PO Take 1,000 mcg by mouth daily.    Marland Kitchen levothyroxine (SYNTHROID, LEVOTHROID) 50 MCG tablet   11  . TRI-SPRINTEC 0.18/0.215/0.25 MG-35 MCG tablet   4  . venlafaxine XR (EFFEXOR-XR) 75 MG 24 hr capsule Take 1 capsule (75 mg total) by mouth daily with breakfast. 30 capsule 11  . fluticasone (FLONASE) 50 MCG/ACT nasal spray Place 2 sprays into both nostrils daily. (Patient not taking: Reported on 12/11/2014) 16 g 6   No current facility-administered medications on file prior to visit.    Review of Systems Review of Systems  Constitutional: Negative for fever, appetite change,  and unexpected weight change. pos for fatigue that is improved  Eyes: Negative for pain and visual disturbance.  Respiratory: Negative for cough and shortness of breath.   Cardiovascular: Negative for cp or palpitations    Gastrointestinal: Negative for nausea, diarrhea and constipation.  Genitourinary: Negative for urgency and frequency.  Skin: Negative for pallor and pos for rash on L hand  Neurological: Negative for weakness, light-headedness, and pos for headaches.  Hematological: Negative for adenopathy. Does not bruise/bleed easily.  Psychiatric/Behavioral: Negative for dysphoric mood. The patient is not nervous/anxious.         Objective:   Physical Exam  Constitutional: She appears well-developed and well-nourished. No distress.  obese and well appearing   HENT:  Head: Normocephalic and atraumatic.  Mouth/Throat: Oropharynx is clear and moist.  Eyes: Conjunctivae and EOM are normal. Pupils are equal, round, and reactive to light.  Neck: Normal range of motion. Neck supple. No JVD present. Carotid bruit is not present. No thyromegaly present.  Cardiovascular: Normal rate, regular rhythm, normal heart sounds and intact distal pulses.  Exam reveals no gallop.   Pulmonary/Chest: Effort normal and breath sounds normal. No respiratory distress. She has no wheezes. She has no rales.  No crackles    Abdominal: Soft. Bowel sounds are normal. She exhibits no distension, no abdominal bruit and no mass. There is no tenderness.  Musculoskeletal: She exhibits tenderness. She exhibits no edema.  Pos myofascial trigger points   Lymphadenopathy:    She has no cervical adenopathy.  Neurological: She is alert. She has normal reflexes. No cranial nerve deficit. She exhibits normal muscle tone. Coordination normal.  Skin: Skin is warm and dry. Rash noted. No pallor.  Mild scale and erythema in ring area of L hand   Dry skin generally  Psychiatric: She has a normal mood and affect.  pleasant          Assessment &  Plan:   Problem List Items Addressed This Visit      Digestive   B12 deficiency - Primary    Has had 4 B12 shots Some improvement in paresthesias and fatigue  Lab today for level  Has 1000 mcg drop to take daily      Relevant Orders   Vitamin B12     Musculoskeletal and Integument   Eczema    Suspect from getting moisture caught under ring with frequent hand washing  Will keep ring off until healed Continue eucerin and cortisone-it is improving Avoid hot water       Fibromyalgia    Stable Pt does not desire trial of lyrica yet -may in the future  Enc low impact exercise         Other   Paresthesia    Some imp with b12 shots and also thyroid tx  Level today Continue to follow

## 2015-01-29 NOTE — Patient Instructions (Addendum)
Use your Eucerin cream on dry skin  Use humidifier through winter and keep it clean Start back with flonase  Also nasal saline at the opposite time of day Lab today for B12  Take your under the tongue B12 every day - and we will advise you further    Use cortisone cream daily on ring finger and eucerin until healed Then - when better -wear your ring but try not to collect moisture under it

## 2015-01-30 LAB — VITAMIN B12: Vitamin B-12: 522 pg/mL (ref 211–911)

## 2015-01-30 NOTE — Assessment & Plan Note (Signed)
Has had 4 B12 shots Some improvement in paresthesias and fatigue  Lab today for level  Has 1000 mcg drop to take daily

## 2015-01-30 NOTE — Assessment & Plan Note (Signed)
Some imp with b12 shots and also thyroid tx  Level today Continue to follow

## 2015-01-30 NOTE — Assessment & Plan Note (Signed)
Suspect from getting moisture caught under ring with frequent hand washing  Will keep ring off until healed Continue eucerin and cortisone-it is improving Avoid hot water

## 2015-01-30 NOTE — Assessment & Plan Note (Signed)
Stable Pt does not desire trial of lyrica yet -may in the future  Enc low impact exercise

## 2015-02-10 ENCOUNTER — Telehealth: Payer: Self-pay | Admitting: Family Medicine

## 2015-02-10 ENCOUNTER — Encounter: Payer: Self-pay | Admitting: Family Medicine

## 2015-02-10 MED ORDER — VENLAFAXINE HCL ER 75 MG PO CP24: 75.0000 mg | ORAL_CAPSULE | Freq: Every day | ORAL | Status: DC

## 2015-02-10 MED ORDER — LEVOTHYROXINE SODIUM 50 MCG PO TABS: 50.0000 ug | ORAL_TABLET | Freq: Every day | ORAL | Status: DC

## 2015-02-10 NOTE — Telephone Encounter (Signed)
Refills

## 2015-03-15 ENCOUNTER — Other Ambulatory Visit: Payer: Self-pay | Admitting: Family Medicine

## 2015-03-17 NOTE — Telephone Encounter (Signed)
See your last mychart message with pt. I'm not sure which Rx do you want me to approve and which one do you want me to decline, please advise

## 2015-03-17 NOTE — Telephone Encounter (Signed)
Please deny the weekly shots Approve the monthly shots with refill for a year

## 2015-03-18 NOTE — Telephone Encounter (Signed)
done

## 2015-04-15 ENCOUNTER — Encounter: Payer: Self-pay | Admitting: Family Medicine

## 2015-05-07 ENCOUNTER — Encounter: Payer: Self-pay | Admitting: Family Medicine

## 2015-05-21 ENCOUNTER — Other Ambulatory Visit: Payer: Managed Care, Other (non HMO)

## 2015-06-03 ENCOUNTER — Encounter: Payer: Self-pay | Admitting: Family Medicine

## 2015-06-06 ENCOUNTER — Other Ambulatory Visit (INDEPENDENT_AMBULATORY_CARE_PROVIDER_SITE_OTHER): Payer: Managed Care, Other (non HMO) | Admitting: *Deleted

## 2015-06-06 DIAGNOSIS — Z3201 Encounter for pregnancy test, result positive: Secondary | ICD-10-CM

## 2015-06-06 LAB — HCG, QUANTITATIVE, PREGNANCY: hCG, Beta Chain, Quant, S: <2 m[IU]/mL

## 2015-06-06 NOTE — Progress Notes (Signed)
Pt here for HCG. Positive home UPT 06/05/15. LMP 04/22/15. BTL in 2007. Current complaints are nausea, smell sensitivity, food cravings, and lower abd cramping. Explained to pt to be cautious of ectopic pregnancy, severe abdominal pain, and to report to [facility]U immediately if pain is persistent.   Aneth N Demir   Chief Complaint:  Positive HPT after BTL  EDIS DRACH is  35 y.o. [id number] LMP 04/22/15. Patient is here for follow up of quantitative HCG and ongoing surveillance of pregnancy status.   She is Unknown weeks gestation    Assessment:  Unknown weeks gestation here for ongoing surveillance of pregnancy.  Plan: The patient is instructed to follow up if HCG quant if positive HCG quant drawn today Ectopic precautions

## 2015-06-09 ENCOUNTER — Encounter: Payer: Self-pay | Admitting: Family Medicine

## 2015-06-09 DIAGNOSIS — N83209 Unspecified ovarian cyst, unspecified side: Secondary | ICD-10-CM

## 2015-06-10 ENCOUNTER — Telehealth: Payer: Self-pay | Admitting: *Deleted

## 2015-06-10 NOTE — Telephone Encounter (Signed)
Called pt to adv that Dr Kennon Rounds authorized follow up US. LM for her to rtn call to schedule that.

## 2015-06-10 NOTE — Telephone Encounter (Signed)
Order placed for pelvic US to follow up on cyst.

## 2015-06-12 ENCOUNTER — Encounter: Payer: Self-pay | Admitting: Family Medicine

## 2015-06-12 ENCOUNTER — Telehealth: Payer: Managed Care, Other (non HMO) | Admitting: Physician Assistant

## 2015-06-12 ENCOUNTER — Telehealth: Payer: Self-pay | Admitting: *Deleted

## 2015-06-12 DIAGNOSIS — R109 Unspecified abdominal pain: Secondary | ICD-10-CM

## 2015-06-12 DIAGNOSIS — L255 Unspecified contact dermatitis due to plants, except food: Secondary | ICD-10-CM

## 2015-06-12 MED ORDER — PREDNISONE 10 MG (21) PO TBPK: ORAL_TABLET | ORAL | Status: DC

## 2015-06-12 MED ORDER — DICLOFENAC SODIUM 75 MG PO TBEC: 75.0000 mg | DELAYED_RELEASE_TABLET | Freq: Two times a day (BID) | ORAL | Status: DC

## 2015-06-12 NOTE — Telephone Encounter (Signed)
Pt called in stating she has started her cycle and it is very heavy flow with clots and causing abdominal pain with no relief after alternating ibuprofen and tylenol. Sent Diclofenac to pharmacy and advised pt that she could take that to see if it will help with the pain and possibly decrease menstrual flow. Pt expressed understanding.

## 2015-06-12 NOTE — Progress Notes (Signed)

## 2015-06-13 ENCOUNTER — Telehealth: Payer: Managed Care, Other (non HMO) | Admitting: Family

## 2015-06-13 DIAGNOSIS — J069 Acute upper respiratory infection, unspecified: Secondary | ICD-10-CM

## 2015-06-13 DIAGNOSIS — B9689 Other specified bacterial agents as the cause of diseases classified elsewhere: Secondary | ICD-10-CM

## 2015-06-13 MED ORDER — LEVOFLOXACIN 500 MG PO TABS: 500.0000 mg | ORAL_TABLET | Freq: Every day | ORAL | Status: DC

## 2015-06-13 NOTE — Progress Notes (Signed)

## 2015-06-17 ENCOUNTER — Ambulatory Visit (HOSPITAL_COMMUNITY)
Admission: RE | Admit: 2015-06-17 | Discharge: 2015-06-17 | Disposition: A | Discharge status: Home / Self Care | Payer: Managed Care, Other (non HMO) | Source: Ambulatory Visit | Attending: Family Medicine | Admitting: Family Medicine

## 2015-06-17 DIAGNOSIS — N83209 Unspecified ovarian cyst, unspecified side: Secondary | ICD-10-CM

## 2015-06-17 DIAGNOSIS — N854 Malposition of uterus: Secondary | ICD-10-CM | POA: Diagnosis not present

## 2015-06-17 DIAGNOSIS — N939 Abnormal uterine and vaginal bleeding, unspecified: Secondary | ICD-10-CM | POA: Insufficient documentation

## 2015-06-17 DIAGNOSIS — R109 Unspecified abdominal pain: Secondary | ICD-10-CM | POA: Diagnosis not present

## 2015-06-17 DIAGNOSIS — N83202 Unspecified ovarian cyst, left side: Secondary | ICD-10-CM | POA: Diagnosis not present

## 2015-06-19 ENCOUNTER — Encounter: Payer: Self-pay | Admitting: Family Medicine

## 2015-06-24 ENCOUNTER — Ambulatory Visit: Payer: Managed Care, Other (non HMO) | Admitting: Family Medicine

## 2015-06-25 ENCOUNTER — Ambulatory Visit: Payer: Self-pay | Admitting: Family Medicine

## 2015-06-25 ENCOUNTER — Ambulatory Visit: Payer: Managed Care, Other (non HMO) | Admitting: Family Medicine

## 2015-07-02 ENCOUNTER — Encounter: Payer: Self-pay | Admitting: Family Medicine

## 2015-07-07 NOTE — Telephone Encounter (Signed)
Left message on pt phone to call to "tweak" appointment time she scheduled on myChart.  Dr. Glori Bickers would like her to come in at 10:00 on 07/16/15 if possible, PCP would like 30.   / lt

## 2015-07-15 ENCOUNTER — Ambulatory Visit: Payer: Self-pay | Admitting: Family Medicine

## 2015-07-18 ENCOUNTER — Ambulatory Visit: Payer: Managed Care, Other (non HMO) | Admitting: Family Medicine

## 2015-07-21 ENCOUNTER — Ambulatory Visit (INDEPENDENT_AMBULATORY_CARE_PROVIDER_SITE_OTHER): Payer: Managed Care, Other (non HMO) | Admitting: Primary Care

## 2015-07-21 ENCOUNTER — Encounter: Payer: Self-pay | Admitting: Primary Care

## 2015-07-21 VITALS — BP 122/74 | HR 74 | Temp 98.0°F | Ht 64.0 in | Wt 182.0 lb

## 2015-07-21 DIAGNOSIS — E538 Deficiency of other specified B group vitamins: Secondary | ICD-10-CM

## 2015-07-21 DIAGNOSIS — E669 Obesity, unspecified: Secondary | ICD-10-CM | POA: Diagnosis not present

## 2015-07-21 DIAGNOSIS — E559 Vitamin D deficiency, unspecified: Secondary | ICD-10-CM

## 2015-07-21 LAB — VITAMIN D 25 HYDROXY (VIT D DEFICIENCY, FRACTURES): VITD: 64.78 ng/mL (ref 30.00–100.00)

## 2015-07-21 LAB — VITAMIN B12: VITAMIN B 12: 719 pg/mL (ref 211–911)

## 2015-07-21 NOTE — Patient Instructions (Signed)
Complete lab work prior to leaving today. I will notify you of your results once received.   Take a look at the App called "My Fitness Pal" to help track calories.  You should be getting 150 minutes of moderate intensity exercise in a 7 day period. Walking is not enough at this point.  Continue your efforts towards a healthy diet by limiting junk food, sweets, starchy vegetables.  Ensure you are consuming 64 ounces of water daily.  I will be in touch with you soon.  It was a pleasure meeting you!

## 2015-07-21 NOTE — Assessment & Plan Note (Addendum)
Struggled with for the past 4 years, interested in weight loss medication. Overall fair diet, but does not get enough exercise. Discussed recommendations for 150 minutes of moderate exercise daily and that the walking she does is not enough. Also discussed phone App's such as My Fitness Pal for calorie tracking.  Will need to discuss this with her PCP prior to medication initiation as these are high risk medications requiring frequent monitoring. She is agreeable to this plan and understands I will be in touch with her later this week.  Check Vitamin B12 and D today given history of deficiency.

## 2015-07-21 NOTE — Assessment & Plan Note (Signed)
Due for labs today, pending. Continue drops for now.

## 2015-07-21 NOTE — Progress Notes (Signed)
Pre visit review using our clinic review tool, if applicable. No additional management support is needed unless otherwise documented below in the visit note. 

## 2015-07-21 NOTE — Progress Notes (Signed)
Subjective:    Patient ID: KANISE GLACE, female    DOB: [birth year], 35 y.o.   MRN: [id number]  HPI  Ms. Hover is a 35 year old female who presents today with a chief complaint of weight gain. She has struggled with obesity and weight gain for the past 4 years. She exercises and eats well for the most part but continues to gain weight. She has a history of Vitamin B12 deficiency and has been using drops and is due for her injection. She has gone from 140 pounds to 180 pounds in the past 6-7 years. She has a family history of obesity and attributes her lack of success to this.   She is interested in weight loss medication as she has been successful on this in the past.  She endorses a healthy diet which consists of: Breakfast: Oatmeal with fruit, hard boiled eggs with [place] bacon Lunch: Salads  Dinner: Grilled chicken, steak, vegetable (corn, greens, sweet potatoes) legumes Snacks: Popcorn, fruit, cookies Desserts: Occasionally Beverages: Water, coffee  Exercise: Walks 4 times weekly, 20 minute intervals. Also does jumping jacks, yoga, sit ups throughout the week.  Review of Systems  Constitutional: Negative for unexpected weight change.  Respiratory: Negative for shortness of breath.   Cardiovascular: Negative for chest pain and palpitations.  Neurological: Negative for dizziness.       Past Medical History  Diagnosis Date  . GERD (gastroesophageal reflux disease)   . Endometriosis   . Fatigue   . Kidney stone   . Vitamin B12 deficiency   . Migraine   . Personal history of colonic polyps 06/01/2006    sessil serrated adenoma  . Family history of malignant neoplasm of gastrointestinal tract   . Fibromyalgia   . ADD (attention deficit disorder)      Social History   Social History  . Marital Status: Married    Spouse Name: N/A  . Number of Children: 2  . Years of Education: N/A   Occupational History  . accounting    Social History Main Topics  . Smoking  status: Former Smoker -- 0.25 packs/day    Quit date: 01/25/2005  . Smokeless tobacco: Never Used  . Alcohol Use: Yes     Comment: rare  . Drug Use: No  . Sexual Activity: Yes    Birth Control/ Protection: Surgical     Comment: tubal ligation   Other Topics Concern  . Not on file   Social History Narrative   Lives with husband and son, 1 dog   Occupation: Sales promotion account executive   Activity: yoga, pilates, walks 3x/wk 2 mi   Diet: gluten free diet, avoids fried foods, good water 32-64 oz, fruits/vegetables daily    Past Surgical History  Procedure Laterality Date  . Cholecystectomy    . Ovarian cyst surgery    . Laparoscopy  11/2004    endometriosis  . Breast surgery  2004    breast reduction  . Tubal ligation  2008  . Wisdom tooth extraction      with four other teeth as well    Family History  Problem Relation Age of Onset  . Cervical cancer Mother 18  . Hypertension Mother   . Diabetes Mother   . Kidney cancer Mother 57  . Colon polyps Sister   . Kidney failure Brother 104    s/p transplant ?due to PCKD or renal stones  . Breast cancer Maternal Aunt   . Breast cancer Maternal Grandmother   .  Colon cancer Paternal Grandfather   . Wilson's disease Father     liver transplant  . Ulcerative colitis Father   . Esophageal cancer Neg Hx   . Stomach cancer Neg Hx   . Rectal cancer Neg Hx     Allergies  Allergen Reactions  . Strawberry Extract Anaphylaxis and Hives  . Clindamycin     REACTION: hives, angioedema  . Gabapentin     Dizzy/ worse headache  . Penicillins     REACTION: hives, angioedema    Current Outpatient Prescriptions on File Prior to Visit  Medication Sig Dispense Refill  . cholecalciferol (VITAMIN D) 1000 UNITS tablet Take 1,000 Units by mouth daily. Reported on 07/21/2015    . cyanocobalamin (,VITAMIN B-12,) 1000 MCG/ML injection INJECT 1 ML (1,000 MCG TOTAL) INTO THE MUSCLE EVERY 30 DAYS. (Patient not taking: Reported on 07/21/2015) 1 mL 11  .  fluticasone (FLONASE) 50 MCG/ACT nasal spray Place 2 sprays into both nostrils daily. (Patient not taking: Reported on 12/11/2014) 16 g 6  . IODINE, KELP, PO Take 1,000 mcg by mouth daily. Reported on 07/21/2015     No current facility-administered medications on file prior to visit.    BP 122/74 mmHg  Pulse 74  Temp(Src) 98 F (36.7 C) (Oral)  Ht 5\' 4"  (1.626 m)  Wt 182 lb (82.555 kg)  BMI 31.22 kg/m2  SpO2 98%  LMP 07/05/2015    Objective:   Physical Exam  Constitutional: She appears well-nourished.  Neck: Neck supple.  Cardiovascular: Normal rate, regular rhythm and normal heart sounds.   No murmur heard. Pulmonary/Chest: Effort normal and breath sounds normal.  Skin: Skin is warm and dry.  Psychiatric: She has a normal mood and affect.          Assessment & Plan:

## 2015-07-30 ENCOUNTER — Encounter: Payer: Self-pay | Admitting: Primary Care

## 2015-07-30 ENCOUNTER — Encounter: Payer: Self-pay | Admitting: Family Medicine

## 2015-08-04 ENCOUNTER — Encounter: Payer: Self-pay | Admitting: Family Medicine

## 2015-08-05 ENCOUNTER — Telehealth: Payer: Self-pay | Admitting: Family Medicine

## 2015-08-05 MED ORDER — LORCASERIN HCL 10 MG PO TABS: 10.0000 mg | ORAL_TABLET | Freq: Two times a day (BID) | ORAL | Status: DC

## 2015-08-05 NOTE — Telephone Encounter (Signed)
Please call this in to [facility] Thanks

## 2015-08-06 NOTE — Telephone Encounter (Signed)
Rx called in as prescribed 

## 2015-08-07 ENCOUNTER — Encounter: Payer: Self-pay | Admitting: Family Medicine

## 2015-08-08 NOTE — Telephone Encounter (Signed)
Received fax saying PA was declined because coverage of this medication is excluded from pt's pharmacy plan. It says it's not medical necessary. Fax placed in your inbox for review

## 2015-08-08 NOTE — Telephone Encounter (Signed)
I am waiting for a PA on pt's med but she asked is there any weight loss med that's affordable if her insurance doesn't cover the belviq (see mychart message)

## 2015-08-28 ENCOUNTER — Encounter: Payer: Self-pay | Admitting: Family Medicine

## 2015-09-01 NOTE — Telephone Encounter (Signed)
See pt's mychart message

## 2015-09-09 ENCOUNTER — Encounter: Payer: Self-pay | Admitting: Family Medicine

## 2015-09-11 ENCOUNTER — Ambulatory Visit: Payer: Managed Care, Other (non HMO) | Admitting: Primary Care

## 2015-09-17 ENCOUNTER — Ambulatory Visit: Payer: Managed Care, Other (non HMO) | Admitting: Family Medicine

## 2015-09-17 DIAGNOSIS — Z0289 Encounter for other administrative examinations: Secondary | ICD-10-CM

## 2015-09-18 ENCOUNTER — Telehealth: Payer: Self-pay | Admitting: Family Medicine

## 2015-09-18 NOTE — Telephone Encounter (Signed)
Patient did not come in for their appointment today for numbness in arm/hand   Please let me know if patient needs to be contacted immediately for follow up or no follow up needed.

## 2015-09-24 ENCOUNTER — Encounter: Payer: Self-pay | Admitting: Primary Care

## 2015-09-24 ENCOUNTER — Ambulatory Visit (INDEPENDENT_AMBULATORY_CARE_PROVIDER_SITE_OTHER): Payer: Managed Care, Other (non HMO) | Admitting: Primary Care

## 2015-09-24 ENCOUNTER — Ambulatory Visit (INDEPENDENT_AMBULATORY_CARE_PROVIDER_SITE_OTHER)
Admission: RE | Admit: 2015-09-24 | Discharge: 2015-09-24 | Disposition: A | Discharge status: Home / Self Care | Payer: Managed Care, Other (non HMO) | Source: Ambulatory Visit | Attending: Primary Care | Admitting: Primary Care

## 2015-09-24 VITALS — BP 122/70 | HR 70 | Temp 97.5°F | Ht 64.0 in | Wt 178.1 lb

## 2015-09-24 DIAGNOSIS — E538 Deficiency of other specified B group vitamins: Secondary | ICD-10-CM

## 2015-09-24 DIAGNOSIS — R202 Paresthesia of skin: Secondary | ICD-10-CM

## 2015-09-24 DIAGNOSIS — R631 Polydipsia: Secondary | ICD-10-CM | POA: Diagnosis not present

## 2015-09-24 DIAGNOSIS — R600 Localized edema: Secondary | ICD-10-CM

## 2015-09-24 LAB — COMPREHENSIVE METABOLIC PANEL
ALBUMIN: 4.2 g/dL (ref 3.5–5.2)
ALK PHOS: 44 U/L (ref 39–117)
ALT: 14 U/L (ref 0–35)
AST: 16 U/L (ref 0–37)
BUN: 6 mg/dL (ref 6–23)
CALCIUM: 9 mg/dL (ref 8.4–10.5)
CO2: 27 mEq/L (ref 19–32)
Chloride: 105 mEq/L (ref 96–112)
Creatinine, Ser: 0.67 mg/dL (ref 0.40–1.20)
GFR: 104.94 mL/min (ref >60.00)
Glucose, Bld: 91 mg/dL (ref 70–99)
POTASSIUM: 3.8 meq/L (ref 3.5–5.1)
Sodium: 138 mEq/L (ref 135–145)
TOTAL PROTEIN: 6.9 g/dL (ref 6.0–8.3)
Total Bilirubin: 0.6 mg/dL (ref 0.2–1.2)

## 2015-09-24 LAB — POC URINALSYSI DIPSTICK (AUTOMATED)
Bilirubin, UA: NEGATIVE
GLUCOSE UA: NEGATIVE
Ketones, UA: NEGATIVE
Leukocytes, UA: NEGATIVE
NITRITE UA: NEGATIVE
Protein, UA: NEGATIVE
RBC UA: NEGATIVE
Spec Grav, UA: 1.025
UROBILINOGEN UA: NEGATIVE
pH, UA: 6

## 2015-09-24 LAB — CBC
HCT: 37.2 % (ref 36.0–46.0)
Hemoglobin: 12.6 g/dL (ref 12.0–15.0)
MCHC: 33.9 g/dL (ref 30.0–36.0)
MCV: 88.7 fl (ref 78.0–100.0)
PLATELETS: 218 10*3/uL (ref 150.0–400.0)
RBC: 4.2 Mil/uL (ref 3.87–5.11)
RDW: 13 % (ref 11.5–15.5)
WBC: 5.3 10*3/uL (ref 4.0–10.5)

## 2015-09-24 LAB — TSH: TSH: 1.33 u[IU]/mL (ref 0.35–4.50)

## 2015-09-24 LAB — T4, FREE: FREE T4: 0.85 ng/dL (ref 0.60–1.60)

## 2015-09-24 LAB — HEMOGLOBIN A1C: HEMOGLOBIN A1C: 5 % (ref 4.6–6.5)

## 2015-09-24 LAB — VITAMIN B12: Vitamin B-12: 1223 pg/mL — ABNORMAL HIGH (ref 211–911)

## 2015-09-24 NOTE — Assessment & Plan Note (Signed)
Normal levels in June 2017, however patient would like this rechecked today. Repeat levels above goal, will have her reduce consumption of vitamin B12.

## 2015-09-24 NOTE — Progress Notes (Signed)
Pre visit review using our clinic review tool, if applicable. No additional management support is needed unless otherwise documented below in the visit note. 

## 2015-09-24 NOTE — Assessment & Plan Note (Signed)
Positive Phalen's sign. Left upper extremity symptoms could be related to previous neck injury. Will rule out any changes with x-ray today. Long discussion regarding history of normal labs in the past. We'll recheck TSH, B12, CMP. Will obtain A1c given polydipsia and obesity. Carpal tunnel likely part of the cause. Will await labs. May need EMG testing.

## 2015-09-24 NOTE — Patient Instructions (Signed)
Complete lab work and xray prior to leaving today.   Ensure to elevate your legs when at home. This will help to reduce swelling.   The Belviq could also be causing the swelling to your legs.  Ensure you are getting up from your desk at work once every hour to prevent swelling.  Compression socks can also help to reduce swelling.  You need about 64 ounces of water daily. Drinking too much water can cause water retention.   I will be in touch with you later today or tomorrow regarding your results.  It was a pleasure to see you today!

## 2015-09-24 NOTE — Assessment & Plan Note (Signed)
Located bilaterally for the past 2 weeks. Leads a sedentary lifestyle, does not get up often at work. Discussed to get up from her desk every hour, reduce consumption of bottle water to 64 ounces daily, elevate extremities in the evening, consider compression stockings.  UA today: Without evidence of protein. Will obtain renal function panel to rule out other causes.  Lower extremity edema is a side effect of Lorcaserin, discussed this with patient. Likely the cause.

## 2015-09-24 NOTE — Progress Notes (Signed)
Subjective:    Patient ID: [name], female    DOB: 1980/09/10, 35 y.o.   MRN: [id number]  HPI  [name] is a 35 year old female with a history of fibromyalgia, vitamin B 12 deficiency who presents today with a chief complaint of paresthesias and lower extremity edema.   1) Paresthesias:  Her symptoms are present in her hands, left upper extremity, and also her feet. Over the past month she's noticed left arm numbness while sleeping at night that wakes her from sleep. She does experience cramping to her fingers while at work but works on a computer for most of the day. She does have chronic neck pain from an injury 15 years ago. No recent trauma.  She was previously managed on levothyroxine per GYN for 1 year, but was removed from this medication given stable TSH. She has been off of her medicatoin for 3 months. She has noticed hair loss. Vitamin B 12 normal in June 2017, TSH, electrolytes, and renal function normal in November 2016.  2) Lower Extremity Edema: She's noticed lower extremity edema to her bilateral lower extremities for the past 2 weeks, with more swelling to her left lower extremity. She will notice this as her pants will feel tight during the day. She was recently on vacation in [place] and was walking much more than usual. She does not wear compression hose.   She's been drinking 10 bottles of water daily for the past several weeks as she has experienced increased thirst. She's noticed her urine smelling sweeter for the past 10 days. Denies hematuria, pelvic pressure, dysuria, polyphagia, shortness of breath, cough.  Review of Systems  Constitutional: Negative for unexpected weight change.  Eyes: Negative for visual disturbance.  Cardiovascular: Positive for leg swelling. Negative for palpitations.  Endocrine: Positive for polydipsia. Negative for polyphagia.  Genitourinary: Negative for dysuria, hematuria and pelvic pain.  Musculoskeletal: Positive for neck pain.  Negative for back pain.  Neurological: Positive for numbness. Negative for dizziness.  Psychiatric/Behavioral: Positive for sleep disturbance.       Past Medical History:  Diagnosis Date  . ADD (attention deficit disorder)   . Endometriosis   . Family history of malignant neoplasm of gastrointestinal tract   . Fatigue   . Fibromyalgia   . GERD (gastroesophageal reflux disease)   . Kidney stone   . Migraine   . Personal history of colonic polyps 06/01/2006   sessil serrated adenoma  . Vitamin B12 deficiency      Social History   Social History  . Marital status: Married    Spouse name: N/A  . Number of children: 2  . Years of education: N/A   Occupational History  . accounting [employer] History Main Topics  . Smoking status: Former Smoker    Packs/day: 0.25    Quit date: 01/25/2005  . Smokeless tobacco: Never Used  . Alcohol use Yes     Comment: rare  . Drug use: No  . Sexual activity: Yes    Birth control/ protection: Surgical     Comment: tubal ligation   Other Topics Concern  . Not on file   Social History Narrative   Lives with husband and son, 1 dog   Occupation: Sales promotion account executive   Activity: yoga, pilates, walks 3x/wk 2 mi   Diet: gluten free diet, avoids fried foods, good water 32-64 oz, fruits/vegetables daily    Past Surgical History:  Procedure Laterality Date  . BREAST SURGERY  2004   breast reduction  . CHOLECYSTECTOMY    . LAPAROSCOPY  11/2004   endometriosis  . OVARIAN CYST SURGERY    . TUBAL LIGATION  2008  . WISDOM TOOTH EXTRACTION     with four other teeth as well    Family History  Problem Relation Age of Onset  . Cervical cancer Mother 74  . Hypertension Mother   . Diabetes Mother   . Kidney cancer Mother 61  . Colon polyps Sister   . Kidney failure Brother 84    s/p transplant ?due to PCKD or renal stones  . Breast cancer Maternal Aunt   . Breast cancer Maternal Grandmother   . Colon cancer Paternal  Grandfather   . Wilson's disease Father     liver transplant  . Ulcerative colitis Father   . Esophageal cancer Neg Hx   . Stomach cancer Neg Hx   . Rectal cancer Neg Hx     Allergies  Allergen Reactions  . Strawberry Extract Anaphylaxis and Hives  . Clindamycin     REACTION: hives, angioedema  . Gabapentin     Dizzy/ worse headache  . Penicillins     REACTION: hives, angioedema    Current Outpatient Prescriptions on File Prior to Visit  Medication Sig Dispense Refill  . cholecalciferol (VITAMIN D) 1000 UNITS tablet Take 1,000 Units by mouth daily. Reported on 07/21/2015    . Lorcaserin HCl 10 MG TABS Take 10 mg by mouth 2 (two) times daily. 60 tablet 3  . fluticasone (FLONASE) 50 MCG/ACT nasal spray Place 2 sprays into both nostrils daily. (Patient not taking: Reported on 12/11/2014) 16 g 6   No current facility-administered medications on file prior to visit.     BP 122/70   Pulse 70   Temp 97.5 F (36.4 C) (Oral)   Ht 5\' 4"  (1.626 m)   Wt 178 lb 1.9 oz (80.8 kg)   LMP 09/19/2015   SpO2 98%   BMI 30.57 kg/m    Objective:   Physical Exam  Constitutional: She appears well-nourished.  Eyes: EOM are normal. Pupils are equal, round, and reactive to light.  Neck: Neck supple. No thyromegaly present.  Cardiovascular: Normal rate and regular rhythm.   No lower extremity edema noted bilaterally.  Pulmonary/Chest: Effort normal and breath sounds normal.  Neurological: She displays normal reflexes. No cranial nerve deficit.  Positive Phalen sign  Skin: Skin is warm and dry.  Psychiatric: She has a normal mood and affect.          Assessment & Plan:

## 2015-09-25 ENCOUNTER — Encounter: Payer: Self-pay | Admitting: Primary Care

## 2015-09-25 NOTE — Progress Notes (Signed)
Thanks for the heads up - she has hx of a lot of unexplained symptoms in the past -whether stress related or other is hard to tell.  Everything looks good- next step would be neurology eval if not improved.  Thanks for seeing her.

## 2015-10-10 ENCOUNTER — Encounter: Payer: Self-pay | Admitting: Primary Care

## 2015-10-14 ENCOUNTER — Telehealth: Payer: Self-pay | Admitting: Family Medicine

## 2015-10-14 ENCOUNTER — Ambulatory Visit: Payer: Self-pay | Admitting: Primary Care

## 2015-10-14 DIAGNOSIS — Z0289 Encounter for other administrative examinations: Secondary | ICD-10-CM

## 2015-10-14 NOTE — Telephone Encounter (Signed)
No immediate followup needed 

## 2015-10-14 NOTE — Telephone Encounter (Signed)
Patient did not come in for their appointment today for my chart appointment  Please let me know if patient needs to be contacted immediately for follow up or no follow up needed.

## 2016-01-21 ENCOUNTER — Encounter: Payer: Self-pay | Admitting: Family Medicine

## 2016-01-21 NOTE — Telephone Encounter (Signed)
Called pt to discuss problem with her via phone, no answer, left message and recommended her to schedule appt for re-evaluation and to discuss treatment options with the physician.

## 2016-02-10 ENCOUNTER — Ambulatory Visit (INDEPENDENT_AMBULATORY_CARE_PROVIDER_SITE_OTHER): Payer: Managed Care, Other (non HMO) | Admitting: Family Medicine

## 2016-02-10 ENCOUNTER — Encounter: Payer: Self-pay | Admitting: Family Medicine

## 2016-02-10 DIAGNOSIS — N921 Excessive and frequent menstruation with irregular cycle: Secondary | ICD-10-CM

## 2016-02-10 DIAGNOSIS — R102 Pelvic and perineal pain: Secondary | ICD-10-CM

## 2016-02-10 NOTE — Assessment & Plan Note (Signed)
Has h/o endometriosis, but pain increased post BTL.

## 2016-02-10 NOTE — Patient Instructions (Signed)
Endometrial Ablation Endometrial ablation removes the lining of the uterus (endometrium). It is usually a same-day, outpatient treatment. Ablation helps avoid major surgery, such as surgery to remove the cervix and uterus (hysterectomy). After endometrial ablation, you will have little or no menstrual bleeding and may not be able to have children. However, if you are premenopausal, you will need to use a reliable method of birth control following the procedure because of the small chance that pregnancy can occur. There are different reasons to have this procedure. These reasons include:  Heavy periods.  Bleeding that is causing anemia.  Irregular bleeding.  Bleeding fibroids on the lining inside the uterus if they are smaller than 3 centimeters. This procedure may not be possible for you if:   You want to have children in the future.   You have severe cramps with your menstrual period.   You have precancerous or cancerous cells in your uterus.   You were recently pregnant.   You have gone through menopause.   You have had major surgery on your uterus, resulting in thinning of the uterine wall. Surgeries may include:  The removal of one or more uterine fibroids (myomectomy).  A cesarean section with a classic (vertical) incision on your uterus. Ask your health care provider what type of cesarean you had. Sometimes the scar on your skin is different than the scar on your uterus. Even if you have had surgery on your uterus, certain types of ablation may still be safe for you. Talk with your health care provider. LET YOUR HEALTH CARE PROVIDER KNOW ABOUT:  Any allergies you have.  All medicines you are taking, including vitamins, herbs, eye drops, creams, and over-the-counter medicines.  Previous problems you or members of your family have had with the use of anesthetics.  Any blood disorders you have.  Previous surgeries you have had.  Medical conditions you have. RISKS AND  COMPLICATIONS  Generally, this is a safe procedure. However, as with any procedure, complications can occur. Possible complications include:  Perforation of the uterus.  Bleeding.  Infection of the uterus, bladder, or vagina.  Injury to surrounding organs.  An air bubble to the lung (air embolus).  Pregnancy following the procedure.  Failure of the procedure to help the problem, requiring hysterectomy.  Decreased ability to diagnose cancer in the lining of the uterus. BEFORE THE PROCEDURE  The lining of the uterus must be tested to make sure there is no pre-cancerous or cancer cells present.  An ultrasound may be performed to look at the size of the uterus and to check for abnormalities.  Medicines may be given to thin the lining of the uterus. PROCEDURE  During the procedure, your health care provider will use a tool called a resectoscope to help see inside your uterus. There are different ways to remove the lining of your uterus.   Radiofrequency - This method uses a radiofrequency-alternating electric current to remove the lining of the uterus.  Cryotherapy - This method uses extreme cold to freeze the lining of the uterus.  Heated-Free Liquid - This method uses heated salt (saline) solution to remove the lining of the uterus.  Microwave - This method uses high-energy microwaves to heat up the lining of the uterus to remove it.  Thermal balloon - This method involves inserting a catheter with a balloon tip into the uterus. The balloon tip is filled with heated fluid to remove the lining of the uterus. AFTER THE PROCEDURE  After your procedure, do   not have sexual intercourse or insert anything into your vagina until permitted by your health care provider. After the procedure, you may experience:  Cramps.  Vaginal discharge.  Frequent urination. This information is not intended to replace advice given to you by your health care provider. Make sure you discuss any  questions you have with your health care provider. Document Released: 11/21/2003 Document Revised: 10/02/2014 Document Reviewed: 06/14/2012 Elsevier Interactive Patient Education  2017 [employer]  

## 2016-02-10 NOTE — Progress Notes (Signed)
   Subjective:    Patient ID: [name] is a 36 y.o. female presenting with Irregular Menstrual Cycles  on 02/10/2016  HPI: Has long h/o endometriosis pain, and this pain has not continued. She now is having more pain with BM's and painful intercourse. She is having more cycles, which are occurring 2x/month. 1 is 1-2 days and then has one for 3 days. Then she has one several weeks later and is heavier. Has failed OC's and Depo Lupron and Mirena IUD. Notes nausea and hormonal migraines. Sensitivities to food around cycle. Wonders if she might be in early menopause. Has h/o abnormal thyroid, on Synthroid for a year--but has been regular. Last check normal in 08/2015. Reports right sided pelvic pain.  Review of Systems  Constitutional: Negative for chills and fever.  Respiratory: Negative for shortness of breath.   Cardiovascular: Negative for chest pain.  Gastrointestinal: Negative for abdominal pain, nausea and vomiting.  Genitourinary: Positive for pelvic pain and vaginal bleeding. Negative for dysuria.  Skin: Negative for rash.      Objective:    BP 120/77 (BP Location: Left Arm, Patient Position: Sitting, Cuff Size: Normal)   Pulse 75   Resp 18   Ht 5\' 4"  (1.626 m)   Wt 164 lb (74.4 kg)   LMP 01/14/2016   BMI 28.15 kg/m  Physical Exam  Constitutional: She is oriented to person, place, and time. She appears well-developed and well-nourished. No distress.  HENT:  Head: Normocephalic and atraumatic.  Eyes: No scleral icterus.  Neck: Neck supple.  Cardiovascular: Normal rate.   Pulmonary/Chest: Effort normal.  Abdominal: Soft.  Neurological: She is alert and oriented to person, place, and time.  Skin: Skin is warm and dry.  Psychiatric: She has a normal mood and affect.        Assessment & Plan:   Problem List Items Addressed This Visit      Unprioritized   Heavy menses    Have previously scheduled patient for LAVH, however, she canceled due to work. Options  reviewed including OC's, IUD, endometrial ablation, hysterectomy. Leaning toward endometrial ablation due to failure of other options. When ready to book for surgery, would need Megace 40 mg bid. As we continued talking about BTL role in her complaints, she asked about BTL reversal. She will discuss with her husband and if they really want another child refer to REI.      Pelvic pain in female    Has h/o endometriosis, but pain increased post BTL.         Total face-to-face time with patient: 25 minutes. Over 50% of encounter was spent on counseling and coordination of care.  Return in about 4 weeks (around 03/09/2016) for repeat pap.  Donnamae Jude 02/10/2016 2:57 PM

## 2016-02-10 NOTE — Assessment & Plan Note (Signed)
Have previously scheduled patient for LAVH, however, she canceled due to work. Options reviewed including OC's, IUD, endometrial ablation, hysterectomy. Leaning toward endometrial ablation due to failure of other options. When ready to book for surgery, would need Megace 40 mg bid. As we continued talking about BTL role in her complaints, she asked about BTL reversal. She will discuss with her husband and if they really want another child refer to REI.

## 2016-02-10 NOTE — Progress Notes (Signed)
Pt here today c/o irregular menstrual cycles where she is having a period 2-3 times a month over the past 9 months.   Last pap-09-05-2012

## 2016-03-15 ENCOUNTER — Ambulatory Visit (INDEPENDENT_AMBULATORY_CARE_PROVIDER_SITE_OTHER): Payer: Managed Care, Other (non HMO) | Admitting: Primary Care

## 2016-03-15 ENCOUNTER — Encounter: Payer: Self-pay | Admitting: Primary Care

## 2016-03-15 VITALS — BP 122/76 | HR 57 | Temp 97.5°F | Ht 64.0 in | Wt 165.1 lb

## 2016-03-15 DIAGNOSIS — M5442 Lumbago with sciatica, left side: Secondary | ICD-10-CM | POA: Diagnosis not present

## 2016-03-15 MED ORDER — METHOCARBAMOL 500 MG PO TABS: 500.0000 mg | ORAL_TABLET | Freq: Three times a day (TID) | ORAL | 0 refills | Status: DC | PRN

## 2016-03-15 NOTE — Progress Notes (Signed)
Pre visit review using our clinic review tool, if applicable. No additional management support is needed unless otherwise documented below in the visit note. 

## 2016-03-15 NOTE — Progress Notes (Signed)
Subjective:    Patient ID: [name], female    DOB: 1980-10-19, 36 y.o.   MRN: [id number]  HPI  [name] is a 36 year old female who presents today with a chief complaint of back pain and upper extremity numbness.  Her back pain is located to the left lower back that began yesterday. She's been remodeling her house and was scrapeing old paint off of doors and floors. She was sitting, twisting to get up towards her left side and has noticed the pain since. She's noticed radiation of pain down her left lower extremity to about her knee. She's taken tylenol (500 mg 4-6 hours) and motrin (400 mg 4-6 hours), and applied heat and ice with some improvement. She's been climbing stairs which makes it worse.   She noticed numbness to her left arm this morning after waking. She had return of sensation within 10 minutes after waking. She denies neck pain. She had to sleep in an odd position last night given low back pain.  Review of Systems  Musculoskeletal: Positive for back pain.  Skin: Negative for color change and wound.  Neurological: Negative for weakness and numbness.       Past Medical History:  Diagnosis Date  . ADD (attention deficit disorder)   . Endometriosis   . Family history of malignant neoplasm of gastrointestinal tract   . Fatigue   . Fibromyalgia   . GERD (gastroesophageal reflux disease)   . Kidney stone   . Migraine   . Personal history of colonic polyps 06/01/2006   sessil serrated adenoma  . Vitamin B12 deficiency      Social History   Social History  . Marital status: Married    Spouse name: N/A  . Number of children: 2  . Years of education: N/A   Occupational History  . accounting [employer] History Main Topics  . Smoking status: Former Smoker    Packs/day: 0.25    Quit date: 01/25/2005  . Smokeless tobacco: Never Used  . Alcohol use Yes     Comment: rare  . Drug use: No  . Sexual activity: Yes    Birth control/ protection:  Surgical     Comment: tubal ligation   Other Topics Concern  . Not on file   Social History Narrative   Lives with husband and son, 1 dog   Occupation: Sales promotion account executive   Activity: yoga, pilates, walks 3x/wk 2 mi   Diet: gluten free diet, avoids fried foods, good water 32-64 oz, fruits/vegetables daily    Past Surgical History:  Procedure Laterality Date  . BREAST SURGERY  2004   breast reduction  . CHOLECYSTECTOMY    . LAPAROSCOPY  11/2004   endometriosis  . OVARIAN CYST SURGERY    . TUBAL LIGATION  2008  . WISDOM TOOTH EXTRACTION     with four other teeth as well    Family History  Problem Relation Age of Onset  . Cervical cancer Mother 69  . Hypertension Mother   . Diabetes Mother   . Kidney cancer Mother 74  . Cancer Mother     Melanoma  . Kidney failure Brother 31    s/p transplant ?due to PCKD or renal stones  . Wilson's disease Father     liver transplant  . Ulcerative colitis Father   . Liver disease Father   . Colon polyps Sister   . Breast cancer Maternal Aunt   . Breast cancer Maternal  Grandmother   . Colon cancer Paternal Grandfather   . Esophageal cancer Neg Hx   . Stomach cancer Neg Hx   . Rectal cancer Neg Hx     Allergies  Allergen Reactions  . Strawberry Extract Anaphylaxis and Hives  . Clindamycin     REACTION: hives, angioedema  . Gabapentin     Dizzy/ worse headache  . Penicillins     REACTION: hives, angioedema    No current outpatient prescriptions on file prior to visit.   No current facility-administered medications on file prior to visit.     BP 122/76   Pulse (!) 57   Temp 97.5 F (36.4 C) (Oral)   Ht 5\' 4"  (1.626 m)   Wt 165 lb 1.9 oz (74.9 kg)   LMP 03/11/2016   SpO2 98%   BMI 28.34 kg/m  ' Objective:   Physical Exam  Constitutional: She appears well-nourished.  Neck: Neck supple.  Cardiovascular: Normal rate and regular rhythm.   Pulmonary/Chest: Effort normal and breath sounds normal.  Musculoskeletal:         Lumbar back: She exhibits decreased range of motion, tenderness, pain and spasm. She exhibits no bony tenderness.  Tenderness and pain to left lower back. No spinal tenderness. Negative straight leg raise.  Skin: Skin is warm and dry.          Assessment & Plan:  Acute Low Back Pain:  Left lower back since twisting to get up from the floor. Some improvement with OTC treatment and heat. Exam today consistent for muscle spasm, also sciatica secondary to MSK involvement. Good ROM. Suspect left upper extremity numbness secondary to sleeping in odd position. Reassurance provided, especially since symptoms resolved within 10 minutes of waking. Will add in low dose muscle relaxer. Continue NSAIDs, heat, stretching. Avoid from prolonged sitting. Work note provided. Follow up PRN.  Sheral Flow, NP

## 2016-03-15 NOTE — Patient Instructions (Signed)
Your symptoms resemble a muscle spasm to the lower back.   You may take methocarbamol 500 mg every 8 hours as needed for muscle spasms and pain.   Continue application of heat and refrain from sitting still for too long.  It was a pleasure to see you today!   Back Pain, Adult Back pain is very common in adults.The cause of back pain is rarely dangerous and the pain often gets better over time.The cause of your back pain may not be known. Some common causes of back pain include:  Strain of the muscles or ligaments supporting the spine.  Wear and tear (degeneration) of the spinal disks.  Arthritis.  Direct injury to the back. For many people, back pain may return. Since back pain is rarely dangerous, most people can learn to manage this condition on their own. Follow these instructions at home: Watch your back pain for any changes. The following actions may help to lessen any discomfort you are feeling:  Remain active. It is stressful on your back to sit or stand in one place for long periods of time. Do not sit, drive, or stand in one place for more than 30 minutes at a time. Take short walks on even surfaces as soon as you are able.Try to increase the length of time you walk each day.  Exercise regularly as directed by your health care provider. Exercise helps your back heal faster. It also helps avoid future injury by keeping your muscles strong and flexible.  Do not stay in bed.Resting more than 1-2 days can delay your recovery.  Pay attention to your body when you bend and lift. The most comfortable positions are those that put less stress on your recovering back. Always use proper lifting techniques, including:  Bending your knees.  Keeping the load close to your body.  Avoiding twisting.  Find a comfortable position to sleep. Use a firm mattress and lie on your side with your knees slightly bent. If you lie on your back, put a pillow under your knees.  Avoid feeling  anxious or stressed.Stress increases muscle tension and can worsen back pain.It is important to recognize when you are anxious or stressed and learn ways to manage it, such as with exercise.  Take medicines only as directed by your health care provider. Over-the-counter medicines to reduce pain and inflammation are often the most helpful.Your health care provider may prescribe muscle relaxant drugs.These medicines help dull your pain so you can more quickly return to your normal activities and healthy exercise.  Apply ice to the injured area:  Put ice in a plastic bag.  Place a towel between your skin and the bag.  Leave the ice on for 20 minutes, 2-3 times a day for the first 2-3 days. After that, ice and heat may be alternated to reduce pain and spasms.  Maintain a healthy weight. Excess weight puts extra stress on your back and makes it difficult to maintain good posture. Contact a health care provider if:  You have pain that is not relieved with rest or medicine.  You have increasing pain going down into the legs or buttocks.  You have pain that does not improve in one week.  You have night pain.  You lose weight.  You have a fever or chills. Get help right away if:  You develop new bowel or bladder control problems.  You have unusual weakness or numbness in your arms or legs.  You develop nausea or vomiting.  You develop abdominal pain.  You feel faint. This information is not intended to replace advice given to you by your health care provider. Make sure you discuss any questions you have with your health care provider. Document Released: 01/11/2005 Document Revised: 05/22/2015 Document Reviewed: 05/15/2013 Elsevier Interactive Patient Education  2017 [employer].

## 2016-04-22 ENCOUNTER — Encounter: Payer: Self-pay | Admitting: Primary Care

## 2016-04-22 ENCOUNTER — Ambulatory Visit: Payer: Self-pay | Admitting: Primary Care

## 2016-04-22 ENCOUNTER — Ambulatory Visit (INDEPENDENT_AMBULATORY_CARE_PROVIDER_SITE_OTHER): Payer: Managed Care, Other (non HMO) | Admitting: Primary Care

## 2016-04-22 VITALS — BP 122/82 | HR 69 | Temp 98.0°F | Ht 64.0 in | Wt 165.1 lb

## 2016-04-22 DIAGNOSIS — Z3202 Encounter for pregnancy test, result negative: Secondary | ICD-10-CM | POA: Diagnosis not present

## 2016-04-22 DIAGNOSIS — R350 Frequency of micturition: Secondary | ICD-10-CM

## 2016-04-22 LAB — POC URINALSYSI DIPSTICK (AUTOMATED)
BILIRUBIN UA: NEGATIVE
GLUCOSE UA: NEGATIVE
KETONES UA: NEGATIVE
Leukocytes, UA: NEGATIVE
Nitrite, UA: NEGATIVE
Protein, UA: NEGATIVE
SPEC GRAV UA: 1.015 (ref 1.030–1.035)
Urobilinogen, UA: NEGATIVE (ref ?–2.0)
pH, UA: 6.5 (ref 5.0–8.0)

## 2016-04-22 LAB — POCT URINE PREGNANCY: Preg Test, Ur: NEGATIVE

## 2016-04-22 NOTE — Progress Notes (Signed)
Pre visit review using our clinic review tool, if applicable. No additional management support is needed unless otherwise documented below in the visit note. 

## 2016-04-22 NOTE — Progress Notes (Addendum)
Subjective:    Patient ID: [name], female    DOB: 18-Jan-1981, 36 y.o.   MRN: [id number]  HPI  [name] is a 36 year old female with a a history of endometriosis and tubal ligation who presents today with a chief complaint of late menstrual cycle. Her LMP was 03/11/16. She typically has regular menses monthly and has noticed her periods have lightened up over the years.   She also reports light headache, urinary frequency, lower back pain, softer stools 2-3 times daily after eating this week, some fatigue, mild "pinching" to her right groin. Her symptoms have been present for the past 1-2 weeks.   Overall she feels well but is concerned about her late menstrual cycle. She's not had endometriosis symptoms for years. She completed a urine pregnancy test 1 week ago which was negative. She did notice mild brownish vaginal bleeding yesterday and today. She denies vomiting, abdominal pain, fevers, vaginal discharge/itching,   Review of Systems  Constitutional: Positive for fatigue. Negative for fever.  Genitourinary: Positive for frequency, menstrual problem and pelvic pain. Negative for hematuria, vaginal bleeding and vaginal discharge.       Past Medical History:  Diagnosis Date  . ADD (attention deficit disorder)   . Endometriosis   . Family history of malignant neoplasm of gastrointestinal tract   . Fatigue   . Fibromyalgia   . GERD (gastroesophageal reflux disease)   . Kidney stone   . Migraine   . Personal history of colonic polyps 06/01/2006   sessil serrated adenoma  . Vitamin B12 deficiency      Social History   Social History  . Marital status: Married    Spouse name: N/A  . Number of children: 2  . 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 Smoking status: Former Smoker    Packs/day: 0.25    Quit date: 01/25/2005  . Smokeless tobacco: Never Used  . Alcohol use Yes     Comment: rare  .  Drug use: No  . Sexual activity: Yes    Birth control/ protection: Surgical     Comment: tubal ligation   Other Topics Concern  . Not on file   Social History Narrative   Lives with husband and son, 1 dog   Occupation: Sales promotion account executive   Activity: yoga, pilates, walks 3x/wk 2 mi   Diet: gluten free diet, avoids fried foods, good water 32-64 oz, fruits/vegetables daily    Past Surgical History:  Procedure Laterality Date  . BREAST SURGERY  2004   breast reduction  . CHOLECYSTECTOMY    . LAPAROSCOPY  11/2004   endometriosis  . OVARIAN CYST SURGERY    . TUBAL LIGATION  2008  . WISDOM TOOTH EXTRACTION     with four other teeth as well    Family History  Problem Relation Age of Onset  . Cervical cancer Mother 29  . Hypertension Mother   . Diabetes Mother   . Kidney cancer Mother 34  . Cancer Mother     Melanoma  . Kidney failure Brother 64    s/p transplant ?due to PCKD or renal stones  . Wilson's disease Father     liver transplant  . Ulcerative colitis Father   . Liver disease Father   . Colon polyps Sister   . Breast cancer Maternal Aunt   . Breast cancer Maternal Grandmother   . Colon cancer Paternal  Grandfather   . Esophageal cancer Neg Hx   . Stomach cancer Neg Hx   . Rectal cancer Neg Hx     Allergies  Allergen Reactions  . Strawberry Extract Anaphylaxis and Hives  . Clindamycin     REACTION: hives, angioedema  . Gabapentin     Dizzy/ worse headache  . Penicillins     REACTION: hives, angioedema    No current outpatient prescriptions on file prior to visit.   No current facility-administered medications on file prior to visit.     BP 122/82   Pulse 69   Temp 98 F (36.7 C) (Oral)   Ht 5\' 4"  (1.626 m)   Wt 165 lb 1.9 oz (74.9 kg)   LMP 03/11/2016   SpO2 99%   BMI 28.34 kg/m    Objective:   Physical Exam  Constitutional: She appears well-nourished. She does not appear ill.  Neck: Neck supple.  Cardiovascular: Normal rate and regular  rhythm.   Pulmonary/Chest: Effort normal and breath sounds normal.  Abdominal: Soft. Normal appearance and bowel sounds are normal. There is tenderness in the epigastric area and suprapubic area. There is no CVA tenderness.  Skin: Skin is warm and dry.          Assessment & Plan:  Irregular Menses/Premenstrual Symptoms:  Soft stools, low back pain, right groin pinching, recent spotting. LMP 03/11/16. Urine Preg: negative UA: 1+ blood, negative leuks/nitrities Suspect recent symptoms are premenstrual, especially given recent spotting and blood in UA. Low suspicion for renal stone or GI involvement. Will have her wait this out until mid next week, if no menses then she will follow up with GYN.  Sheral Flow, NP

## 2016-04-22 NOTE — Addendum Note (Signed)
Addended by: Jacqualin Combes on: 04/22/2016 01:06 PM   Modules accepted: Orders

## 2016-04-22 NOTE — Patient Instructions (Signed)
Your urine doesn't show evidence of pregnancy or infection. You do have blood in the urine which could very well be coming from the vagina.  You are likely about to start your menstrual cycle soon given your symptoms with the presence of blood in your urine and also the vaginal spotting.   Please call your GYN if no menstrual cycle by mid next week.  It was a pleasure to see you today!

## 2016-09-23 ENCOUNTER — Telehealth: Payer: Self-pay | Admitting: Radiology

## 2016-09-23 NOTE — Telephone Encounter (Signed)
Left voicemail to call [email or web address] to schedule appointment for her Annual Exam

## 2016-10-11 ENCOUNTER — Telehealth: Payer: Self-pay | Admitting: Family Medicine

## 2016-10-11 NOTE — Telephone Encounter (Signed)
Pt made appointment through my chart see below message is it ok to wait  Appointment For: [facility] ([id number])  Visit Type: MYCHART OFFICE VISIT (1064)    10/13/2016  6:00 PM 15 mins. Tower, Wynelle Fanny, MD    [facility]    Patient Comments:  Office Visit  Issues with numbness in hands and arms, fatigue and   finding it hard to concentrate as well as high resting   heart rate.

## 2016-10-11 NOTE — Telephone Encounter (Signed)
See Robin's message below, this usually should be a 5min appt given her multiple issues but she is scheduled for your 6pm appt (last appt of the day) so I didn't know if it's okay to leave her there or have Robin reschedule to a 30 min slot

## 2016-10-11 NOTE — Telephone Encounter (Signed)
6 pm will work [employer]

## 2016-10-13 ENCOUNTER — Encounter: Payer: Self-pay | Admitting: Family Medicine

## 2016-10-13 ENCOUNTER — Ambulatory Visit (INDEPENDENT_AMBULATORY_CARE_PROVIDER_SITE_OTHER): Payer: BLUE CROSS/BLUE SHIELD | Admitting: Family Medicine

## 2016-10-13 VITALS — BP 120/74 | HR 69 | Temp 97.9°F | Ht 64.0 in | Wt 169.8 lb

## 2016-10-13 DIAGNOSIS — M791 Myalgia: Secondary | ICD-10-CM

## 2016-10-13 DIAGNOSIS — R5382 Chronic fatigue, unspecified: Secondary | ICD-10-CM

## 2016-10-13 DIAGNOSIS — R202 Paresthesia of skin: Secondary | ICD-10-CM

## 2016-10-13 DIAGNOSIS — G4700 Insomnia, unspecified: Secondary | ICD-10-CM | POA: Diagnosis not present

## 2016-10-13 DIAGNOSIS — E538 Deficiency of other specified B group vitamins: Secondary | ICD-10-CM

## 2016-10-13 DIAGNOSIS — M7918 Myalgia, other site: Secondary | ICD-10-CM

## 2016-10-13 MED ORDER — MELOXICAM 15 MG PO TABS: 15.0000 mg | ORAL_TABLET | Freq: Every day | ORAL | 0 refills | Status: DC

## 2016-10-13 MED ORDER — TRAZODONE HCL 50 MG PO TABS: 25.0000 mg | ORAL_TABLET | Freq: Every evening | ORAL | 3 refills | Status: DC | PRN

## 2016-10-13 NOTE — Patient Instructions (Addendum)
Get a carpal tunnel wrist splint from the drug store to wear on left wrist at night  Take the meloxicam 15 mg once daily with food  We will refer you to a neurologist to evaluate the arm/hand issues - it seems like your ulnar nerve seems affected   Labs today for fatigue and concentration problems   Try the trazadone for sleep as well

## 2016-10-13 NOTE — Progress Notes (Signed)
Subjective:    Patient ID: [name], female    DOB: [birth year], 36 y.o.   MRN: [id number]  HPI Here for multiple complaints incl numbness/ inc HR/fatigue and brain fog   Numbness in her L arm/ it feels heavy (all the way to her shoulder) Feels weak/cannot lift with it  On and off =started in July but worse over the past 2 weeks  4,5th fingers feel numb  Elbow does not hurt  Works on a computer  Is R handed  R hand gets numb occas at night   Some mild headaches - not severe  Some times one sided - and occ nausea   Feels occ flutter in her chest  Mostly at night when she lies down  Watches HR on her fitbit - notes her HR is at or above 90 occ but resting in the 70s Normal today   Changed jobs  Stress level is much better   Some trouble focusing (vision and concentration) Eyes feel dry all the time  Sees floaters? Spots- went to the eye doctor and they said her eyes were fine  She has computer glasses now and they help a bit   Had mri of head in 2010- normal for similar symptoms   No joint swelling  Legs hurt  Low back tends to hurt  Everything is worse on the L  Gets stiff after inactivity    Wt Readings from Last 3 Encounters:  10/13/16 169 lb 12 oz (77 kg)  04/22/16 165 lb 1.9 oz (74.9 kg)  03/15/16 165 lb 1.9 oz (74.9 kg)   BP Readings from Last 3 Encounters:  10/13/16 120/74  04/22/16 122/82  03/15/16 122/76   Pulse Readings from Last 3 Encounters:  10/13/16 69  04/22/16 69  03/15/16 (!) 57    Has hx of myofascial pain syndrome  Also endometriosis and low B12  Lab Results  Component Value Date   VITAMINB12 1,223 (H) 09/24/2015   Lab Results  Component Value Date   WBC 5.3 09/24/2015   HGB 12.6 09/24/2015   HCT 37.2 09/24/2015   MCV 88.7 09/24/2015   PLT 218.0 09/24/2015   Lab Results  Component Value Date   TSH 1.33 09/24/2015   Lab Results  Component Value Date   CREATININE 0.67 09/24/2015   BUN 6 09/24/2015   NA 138  09/24/2015   K 3.8 09/24/2015   CL 105 09/24/2015   CO2 27 09/24/2015   Lab Results  Component Value Date   ALT 14 09/24/2015   AST 16 09/24/2015   ALKPHOS 44 09/24/2015   BILITOT 0.6 09/24/2015    Has had a diet changes  Eats fish but no other meat  Eats fish 3 times per week  More green veg and black beans  No exercise in several months   Mother has MS- diagnosed in her 78s  This worries her  Pt had MRI head in the past that was re assuring    Patient Active Problem List   Diagnosis Date Noted  . Paresthesia of arm 10/13/2016  . Insomnia 10/13/2016  . Lower extremity edema 09/24/2015  . Eczema 01/29/2015  . Paresthesia 12/11/2014  . Health maintenance examination 05/16/2014  . Paresthesias 05/16/2014  . Hematuria 05/16/2014  . RUQ abdominal pain 12/05/2013  . Unsteadiness on feet 11/28/2013  . Anxiety disorder 03/19/2013  . Pelvic pain in female 01/09/2013  . Heavy menses 01/03/2013  . ADD (attention deficit disorder) 02/29/2012  . Fibromyalgia  03/26/2011  . Myofascial pain 03/19/2011  . Gluten intolerance 01/22/2011  . Obesity 11/09/2010  . Fatigue 05/18/2010  . FIBROCYSTIC BREAST DISEASE 03/27/2010  . CAVERNOUS HEMANGIOMA, LIVER 12/03/2009  . PERSONAL HISTORY OF COLONIC POLYPS 12/03/2009  . ALLERGIC RHINITIS 03/09/2008  . URINARY FREQUENCY, CHRONIC 01/31/2007  . B12 deficiency 10/28/2006  . GERD 04/27/2006  . Endometriosis 04/27/2006  . RENAL CALCULUS, HX OF 04/27/2006  . MIGRAINES, HX OF 04/27/2006   Past Medical History:  Diagnosis Date  . ADD (attention deficit disorder)   . Endometriosis   . Family history of malignant neoplasm of gastrointestinal tract   . Fatigue   . Fibromyalgia   . GERD (gastroesophageal reflux disease)   . Kidney stone   . Migraine   . Personal history of colonic polyps 06/01/2006   sessil serrated adenoma  . Vitamin B12 deficiency    Past Surgical History:  Procedure Laterality Date  . BREAST SURGERY  2004   breast  reduction  . CHOLECYSTECTOMY    . LAPAROSCOPY  11/2004   endometriosis  . OVARIAN CYST SURGERY    . TUBAL LIGATION  2008  . WISDOM TOOTH EXTRACTION     with four other teeth as well   Social History  Substance Use Topics  . Smoking status: Former Smoker    Packs/day: 0.25    Quit date: 01/25/2005  . Smokeless tobacco: Never Used  . Alcohol use Yes     Comment: rare   Family History  Problem Relation Age of Onset  . Cervical cancer Mother 71  . Hypertension Mother   . Diabetes Mother   . Kidney cancer Mother 59  . Cancer Mother        Melanoma  . Kidney failure Brother 1       s/p transplant ?due to PCKD or renal stones  . Wilson's disease Father        liver transplant  . Ulcerative colitis Father   . Liver disease Father   . Colon polyps Sister   . Breast cancer Maternal Aunt   . Breast cancer Maternal Grandmother   . Colon cancer Paternal Grandfather   . Esophageal cancer Neg Hx   . Stomach cancer Neg Hx   . Rectal cancer Neg Hx    Allergies  Allergen Reactions  . Strawberry Extract Anaphylaxis and Hives  . Clindamycin     REACTION: hives, angioedema  . Gabapentin     Dizzy/ worse headache  . Penicillins     REACTION: hives, angioedema   No current outpatient prescriptions on file prior to visit.   No current facility-administered medications on file prior to visit.     Review of Systems  Constitutional: Positive for fatigue. Negative for activity change, appetite change, fever and unexpected weight change.  HENT: Negative for congestion, ear pain, rhinorrhea, sinus pressure and sore throat.   Eyes: Negative for pain, redness and visual disturbance.  Respiratory: Negative for cough, shortness of breath and wheezing.   Cardiovascular: Positive for palpitations. Negative for chest pain.  Gastrointestinal: Negative for abdominal pain, blood in stool, constipation, diarrhea and nausea.  Endocrine: Negative for polydipsia and polyuria.  Genitourinary:  Negative for dysuria, frequency and urgency.  Musculoskeletal: Positive for arthralgias, myalgias and neck pain. Negative for back pain, joint swelling and neck stiffness.  Skin: Negative for pallor and rash.  Allergic/Immunologic: Negative for environmental allergies.  Neurological: Positive for numbness and headaches. Negative for dizziness, tremors and syncope.  Hematological: Negative for adenopathy. Does  not bruise/bleed easily.  Psychiatric/Behavioral: Positive for decreased concentration. Negative for dysphoric mood. The patient is not nervous/anxious.        Objective:   Physical Exam  Constitutional: She is oriented to person, place, and time. She appears well-developed and well-nourished. No distress.  Well appearing   HENT:  Head: Normocephalic and atraumatic.  Right Ear: External ear normal.  Left Ear: External ear normal.  Nose: Nose normal.  Mouth/Throat: Oropharynx is clear and moist. No oropharyngeal exudate.  No sinus tenderness No temporal tenderness  No TMJ tenderness  Eyes: Pupils are equal, round, and reactive to light. Conjunctivae and EOM are normal. Right eye exhibits no discharge. Left eye exhibits no discharge. No scleral icterus.  No nystagmus  Neck: Normal range of motion and full passive range of motion without pain. Neck supple. No JVD present. Carotid bruit is not present. No tracheal deviation present. No thyromegaly present.  Cardiovascular: Normal rate, regular rhythm and normal heart sounds.   No murmur heard. Pulmonary/Chest: Effort normal and breath sounds normal. No respiratory distress. She has no wheezes. She has no rales.  Abdominal: Soft. Bowel sounds are normal. She exhibits no distension and no mass. There is no tenderness.  Musculoskeletal: She exhibits no edema or tenderness.  No joint swelling or tenderness No trigger points   Lymphadenopathy:    She has no cervical adenopathy.  Neurological: She is alert and oriented to person, place,  and time. She has normal strength and normal reflexes. She displays no atrophy and no tremor. No cranial nerve deficit or sensory deficit. She exhibits normal muscle tone. She displays a negative Romberg sign. Coordination and gait normal.  No focal cerebellar signs   Grip 4/5 in L hand  Pos tinel and phalen tests L wrist for 4,5th finger tingling  Some tenderness over ulnar groove in the elbow  Nl sens to temp/lt touch /sharp in all fingers   No tremor   Skin: Skin is warm and dry. No rash noted. No pallor.  Psychiatric: She has a normal mood and affect. Her behavior is normal. Thought content normal.          Assessment & Plan:   Problem List Items Addressed This Visit      Other   B12 deficiency    Level today  Experiencing more parethesias      Relevant Orders   Vitamin B12   Fatigue    Suspect multi factorial in pt with multiple somatic c/o and myofacial pain syndrome and insomnia  Lab today      Relevant Orders   CBC with Differential/Platelet   Comprehensive metabolic panel   TSH   VITAMIN D 25 Hydroxy (Vit-D Deficiency, Fractures)   Vitamin B12   Insomnia    In pt with myofasical pain syndrome  Trial of trazadone Disc poss side eff        Myofascial pain    With insomnia and paresthesias Trial of trazadone for sleep to see if this helps       Paresthesia of arm - Primary    L arm  With pos tinel/phalen  Also tender over ulnar nerve groove in elbow Ref to neuro for further testing and tx  Will use carpal tunnel splint at night  meloxicam prn with food        Relevant Orders   Ambulatory referral to Neurology

## 2016-10-14 LAB — COMPREHENSIVE METABOLIC PANEL
ALK PHOS: 42 U/L (ref 39–117)
ALT: 12 U/L (ref 0–35)
AST: 14 U/L (ref 0–37)
Albumin: 4.3 g/dL (ref 3.5–5.2)
BUN: 7 mg/dL (ref 6–23)
CHLORIDE: 102 meq/L (ref 96–112)
CO2: 29 mEq/L (ref 19–32)
Calcium: 9.6 mg/dL (ref 8.4–10.5)
Creatinine, Ser: 0.66 mg/dL (ref 0.40–1.20)
GFR: 106.18 mL/min (ref >60.00)
GLUCOSE: 87 mg/dL (ref 70–99)
POTASSIUM: 3.8 meq/L (ref 3.5–5.1)
SODIUM: 137 meq/L (ref 135–145)
Total Bilirubin: 0.7 mg/dL (ref 0.2–1.2)
Total Protein: 7 g/dL (ref 6.0–8.3)

## 2016-10-14 LAB — CBC WITH DIFFERENTIAL/PLATELET
BASOS PCT: 0.7 % (ref 0.0–3.0)
Basophils Absolute: 0.1 10*3/uL (ref 0.0–0.1)
Eosinophils Absolute: 0.1 10*3/uL (ref 0.0–0.7)
Eosinophils Relative: 1.3 % (ref 0.0–5.0)
HCT: 37.5 % (ref 36.0–46.0)
Hemoglobin: 12.5 g/dL (ref 12.0–15.0)
LYMPHS ABS: 2.7 10*3/uL (ref 0.7–4.0)
Lymphocytes Relative: 32.8 % (ref 12.0–46.0)
MCHC: 33.4 g/dL (ref 30.0–36.0)
MCV: 91.2 fl (ref 78.0–100.0)
Monocytes Absolute: 0.9 10*3/uL (ref 0.1–1.0)
Monocytes Relative: 10.9 % (ref 3.0–12.0)
NEUTROS ABS: 4.5 10*3/uL (ref 1.4–7.7)
NEUTROS PCT: 54.3 % (ref 43.0–77.0)
Platelets: 244 10*3/uL (ref 150.0–400.0)
RBC: 4.11 Mil/uL (ref 3.87–5.11)
RDW: 13 % (ref 11.5–15.5)
WBC: 8.2 10*3/uL (ref 4.0–10.5)

## 2016-10-14 LAB — TSH: TSH: 4.19 u[IU]/mL (ref 0.35–4.50)

## 2016-10-14 LAB — VITAMIN D 25 HYDROXY (VIT D DEFICIENCY, FRACTURES): VITD: 37.1 ng/mL (ref 30.00–100.00)

## 2016-10-14 LAB — VITAMIN B12: VITAMIN B 12: 320 pg/mL (ref 211–911)

## 2016-10-14 NOTE — Assessment & Plan Note (Signed)
In pt with myofasical pain syndrome  Trial of trazadone Disc poss side eff

## 2016-10-14 NOTE — Assessment & Plan Note (Signed)
Suspect multi factorial in pt with multiple somatic c/o and myofacial pain syndrome and insomnia  Lab today

## 2016-10-14 NOTE — Assessment & Plan Note (Signed)
L arm  With pos tinel/phalen  Also tender over ulnar nerve groove in elbow Ref to neuro for further testing and tx  Will use carpal tunnel splint at night  meloxicam prn with food

## 2016-10-14 NOTE — Assessment & Plan Note (Signed)
With insomnia and paresthesias Trial of trazadone for sleep to see if this helps

## 2016-10-14 NOTE — Assessment & Plan Note (Signed)
Level today  Experiencing more parethesias

## 2016-11-02 ENCOUNTER — Encounter: Payer: Self-pay | Admitting: Family Medicine

## 2016-11-02 DIAGNOSIS — R202 Paresthesia of skin: Secondary | ICD-10-CM

## 2016-11-02 NOTE — Telephone Encounter (Signed)
Pt is interested in changing her neuro ref to [place] See mychart message- she called them  Will route to [facility]

## 2016-11-08 ENCOUNTER — Encounter: Payer: Self-pay | Admitting: Family Medicine

## 2016-11-08 DIAGNOSIS — R202 Paresthesia of skin: Secondary | ICD-10-CM

## 2016-11-08 DIAGNOSIS — M542 Cervicalgia: Secondary | ICD-10-CM

## 2016-11-09 ENCOUNTER — Ambulatory Visit
Admission: RE | Admit: 2016-11-09 | Discharge: 2016-11-09 | Disposition: A | Discharge status: Home / Self Care | Payer: BLUE CROSS/BLUE SHIELD | Source: Ambulatory Visit | Attending: Family Medicine | Admitting: Family Medicine

## 2016-11-09 ENCOUNTER — Other Ambulatory Visit (HOSPITAL_BASED_OUTPATIENT_CLINIC_OR_DEPARTMENT_OTHER): Payer: BLUE CROSS/BLUE SHIELD

## 2016-11-09 DIAGNOSIS — R202 Paresthesia of skin: Secondary | ICD-10-CM

## 2016-11-09 DIAGNOSIS — M542 Cervicalgia: Secondary | ICD-10-CM | POA: Insufficient documentation

## 2016-11-09 NOTE — Telephone Encounter (Signed)
Pt will be calling to let us know when she will come in for this neck xr  Thanks

## 2016-11-10 ENCOUNTER — Ambulatory Visit (HOSPITAL_BASED_OUTPATIENT_CLINIC_OR_DEPARTMENT_OTHER)
Admission: RE | Admit: 2016-11-10 | Discharge: 2016-11-10 | Disposition: A | Discharge status: Home / Self Care | Payer: BLUE CROSS/BLUE SHIELD | Source: Ambulatory Visit | Attending: Family Medicine | Admitting: Family Medicine

## 2016-11-10 ENCOUNTER — Encounter (HOSPITAL_BASED_OUTPATIENT_CLINIC_OR_DEPARTMENT_OTHER): Payer: Self-pay

## 2016-11-10 DIAGNOSIS — M542 Cervicalgia: Secondary | ICD-10-CM | POA: Diagnosis not present

## 2016-11-11 ENCOUNTER — Other Ambulatory Visit: Payer: Self-pay | Admitting: Family Medicine

## 2016-11-11 ENCOUNTER — Encounter: Payer: Self-pay | Admitting: Family Medicine

## 2016-11-11 ENCOUNTER — Ambulatory Visit (HOSPITAL_BASED_OUTPATIENT_CLINIC_OR_DEPARTMENT_OTHER)
Admission: RE | Admit: 2016-11-11 | Discharge: 2016-11-11 | Disposition: A | Discharge status: Home / Self Care | Payer: Self-pay | Source: Ambulatory Visit | Attending: Family Medicine | Admitting: Family Medicine

## 2016-11-11 DIAGNOSIS — R52 Pain, unspecified: Secondary | ICD-10-CM

## 2016-11-11 DIAGNOSIS — M542 Cervicalgia: Secondary | ICD-10-CM

## 2016-11-11 NOTE — Telephone Encounter (Signed)
Please refer to PT

## 2016-11-11 NOTE — Telephone Encounter (Signed)
-----   Message from Modena Nunnery, [place] sent at 11/11/2016 12:32 PM EDT ----- Spoke to pt and advised. States she prefers a PT in [place]

## 2016-11-15 ENCOUNTER — Telehealth: Payer: Self-pay | Admitting: Family Medicine

## 2016-11-15 NOTE — Telephone Encounter (Signed)
Received a message from patient that she is declining PT at this time b/c her insurance will not pay anything. I am cancelling the order for PT.

## 2016-11-15 NOTE — Telephone Encounter (Signed)
Aware, thanks!

## 2016-12-13 ENCOUNTER — Ambulatory Visit (INDEPENDENT_AMBULATORY_CARE_PROVIDER_SITE_OTHER): Payer: BLUE CROSS/BLUE SHIELD | Admitting: Neurology

## 2016-12-13 ENCOUNTER — Encounter: Payer: Self-pay | Admitting: Neurology

## 2016-12-13 VITALS — BP 140/92 | HR 75 | Ht 64.0 in | Wt 169.0 lb

## 2016-12-13 DIAGNOSIS — R202 Paresthesia of skin: Secondary | ICD-10-CM | POA: Diagnosis not present

## 2016-12-13 DIAGNOSIS — R3915 Urgency of urination: Secondary | ICD-10-CM | POA: Insufficient documentation

## 2016-12-13 DIAGNOSIS — M542 Cervicalgia: Secondary | ICD-10-CM | POA: Diagnosis not present

## 2016-12-13 NOTE — Progress Notes (Signed)
PATIENT: [name] DOB: 16-Feb-1980  Chief Complaint  Patient presents with  . New Patient (Initial Visit)    PCP and referring: Dr. Rise Mu, alone, new room. vision 20/30 with correction in each eye  . Numbness    numbness in left hand, feet and arms. Stiffness when getting up. noticed symptoms for 3 months.     HISTORICAL  [name] is Page 36 years old female, seen in refer by  her primary care doctor Tower, [name] for evaluation of numbness of left hand, feet and arm, initial evaluation was on December 13 2016.  She noticed numbness tingling in her hands and feet is 2015, gradually getting worse, especially since August 2018, she had constant left fourth and fifth finger numbness, sometimes woke up with right arm numbness, she felt stiffness while walking, could no longer walk fast, knees give out underneath her sometimes, also developed urinary urgency, frequency, her feet feels numb, toes forceful flexion,  I reviewed the laboratory evaluation in 2018, B12 320, normal CBC, TSH, CMP,  REVIEW OF SYSTEMS: Full 14 system review of systems performed and notable only for headache, numbness weakness difficulty swallowing insomnia, snoring, restless leg, easy bruising, achy muscles, skin sensitivity, eye pain, ringing ears, fatigue  ALLERGIES: Allergies  Allergen Reactions  . Strawberry Extract Anaphylaxis and Hives  . Clindamycin     REACTION: hives, angioedema  . Gabapentin     Dizzy/ worse headache  . Penicillins     REACTION: hives, angioedema    HOME MEDICATIONS: Current Outpatient Medications  Medication Sig Dispense Refill  . meloxicam (MOBIC) 15 MG tablet Take 1 tablet (15 mg total) by mouth daily. With food 30 tablet 0  . traZODone (DESYREL) 50 MG tablet Take 0.5-1 tablets (25-50 mg total) by mouth at bedtime as needed for sleep. 30 tablet 3   No current facility-administered medications for this visit.     PAST MEDICAL HISTORY: Past Medical  History:  Diagnosis Date  . ADD (attention deficit disorder)   . Endometriosis   . Family history of malignant neoplasm of gastrointestinal tract   . Fatigue   . Fibromyalgia   . GERD (gastroesophageal reflux disease)   . Kidney stone   . Migraine   . Personal history of colonic polyps 06/01/2006   sessil serrated adenoma  . Vitamin B12 deficiency     PAST SURGICAL HISTORY: Past Surgical History:  Procedure Laterality Date  . BREAST SURGERY  2004   breast reduction  . CHOLECYSTECTOMY    . LAPAROSCOPY  11/2004   endometriosis  . OVARIAN CYST SURGERY    . TUBAL LIGATION  2008  . WISDOM TOOTH EXTRACTION     with four other teeth as well    FAMILY HISTORY: Family History  Problem Relation Age of Onset  . Cervical cancer Mother 72  . Hypertension Mother   . Diabetes Mother   . Kidney cancer Mother 73  . Cancer Mother        Melanoma  . Kidney failure Brother 79       s/p transplant ?due to PCKD or renal stones  . Wilson's disease Father        liver transplant  . Ulcerative colitis Father   . Liver disease Father   . Colon polyps Sister   . Breast cancer Maternal Aunt   . Breast cancer Maternal Grandmother   . Colon cancer Paternal Grandfather   . Esophageal cancer Neg Hx   .  Stomach cancer Neg Hx   . Rectal cancer Neg Hx     SOCIAL HISTORY:  Social History   Socioeconomic History  . Marital status: Married    Spouse name: Not on file  . Number of children: 2  . Years of education: Not on file  . Highest education level: Not on file  Social Needs  . Financial resource strain: Not on file  . Food insecurity - worry: Not on file  . Food insecurity - inability: Not on file  . Transportation needs - medical: Not on file  . Transportation needs - non-medical: Not on file  Occupational History  . Occupation: Product/process development scientist: [employer]  Tobacco Use  . Smoking status: Former Smoker    Packs/day: 0.25    Last attempt to quit: 01/25/2005     Years since quitting: 11.8  . Smokeless tobacco: Never Used  Substance and Sexual Activity  . Alcohol use: Yes    Comment: rare  . Drug use: No  . Sexual activity: Yes    Birth control/protection: Surgical    Comment: tubal ligation  Other Topics Concern  . Not on file  Social History Narrative   Lives with husband and son, 1 dog   Occupation: Sales promotion account executive   Activity: yoga, pilates, walks 3x/wk 2 mi   Diet: gluten free diet, avoids fried foods, good water 32-64 oz, fruits/vegetables daily     PHYSICAL EXAM   Vitals:   12/13/16 0753  BP: (!) 140/92  Pulse: 75  Weight: 169 lb (76.7 kg)  Height: 5\' 4"  (1.626 m)    Not recorded      Body mass index is 29.01 kg/m.  PHYSICAL EXAMNIATION:  Gen: NAD, conversant, well nourised, obese, well groomed                     Cardiovascular: Regular rate rhythm, no peripheral edema, warm, nontender. Eyes: Conjunctivae clear without exudates or hemorrhage Neck: Supple, no carotid bruits. Pulmonary: Clear to auscultation bilaterally   NEUROLOGICAL EXAM:  MENTAL STATUS: Speech:    Speech is normal; fluent and spontaneous with normal comprehension.  Cognition:     Orientation to time, place and person     Normal recent and remote memory     Normal Attention span and concentration     Normal Language, naming, repeating,spontaneous speech     Fund of knowledge   CRANIAL NERVES: CN II: Visual fields are full to confrontation. Fundoscopic exam is normal with sharp discs and no vascular changes. Pupils are round equal and briskly reactive to light. CN III, IV, VI: extraocular movement are normal. No ptosis. CN V: Facial sensation is intact to pinprick in all 3 divisions bilaterally. Corneal responses are intact.  CN VII: Face is symmetric with normal eye closure and smile. CN VIII: Hearing is normal to rubbing fingers CN IX, X: Palate elevates symmetrically. Phonation is normal. CN XI: Head turning and shoulder shrug are  intact CN XII: Tongue is midline with normal movements and no atrophy.  MOTOR: There is no pronator drift of out-stretched arms. Muscle bulk and tone are normal. Muscle strength is normal.  REFLEXES: Reflexes are 2+ and symmetric at the biceps, triceps, knees, and ankles. Plantar responses are flexor.  SENSORY: Intact to light touch, pinprick, positional sensation and vibratory sensation are intact in fingers and toes.  COORDINATION: Rapid alternating movements and fine finger movements are intact. There is no dysmetria on finger-to-nose and heel-knee-shin.  GAIT/STANCE: Posture is normal. Gait is steady with normal steps, base, arm swing, and turning. Heel and toe walking are normal. Tandem gait is normal.  Romberg is absent.   DIAGNOSTIC DATA (LABS, IMAGING, TESTING) - I reviewed patient records, labs, notes, testing and imaging myself where available.   ASSESSMENT AND PLAN  MAYZIE CAUGHLIN is Page 36 y.o. female   4 extremity paresthesia, urinary urgency, brisk reflexes,  Need to rule out cervical pathology, differentiation diagnosis also include peripheral neuropathy  EMG nerve conduction study,  MRI of cervical spine   Marcial Pacas, M.D. Ph.D.  [facility] [address], [place], [place] [address] Ph: [phone] Fax: [phone]  CC: Tower, Wynelle Fanny, MD

## 2016-12-14 ENCOUNTER — Encounter: Payer: Self-pay | Admitting: Neurology

## 2016-12-14 NOTE — Addendum Note (Signed)
Addended by: Marcial Pacas on: 12/14/2016 12:04 PM   Modules accepted: Orders

## 2016-12-29 ENCOUNTER — Telehealth: Payer: Self-pay

## 2016-12-29 ENCOUNTER — Other Ambulatory Visit: Payer: BLUE CROSS/BLUE SHIELD

## 2016-12-29 NOTE — Telephone Encounter (Signed)
Left a voicemail stating that I canceled the mri and that I will send the order to [place]. If she hasn't heard from them in the next day to call them at [phone].

## 2016-12-29 NOTE — Telephone Encounter (Signed)
Pt call to cancel MRi cervical spine on mobile unit. She would like it at [facility] due to her insurance in network. Please send to [place].

## 2017-01-04 ENCOUNTER — Ambulatory Visit: Payer: BLUE CROSS/BLUE SHIELD | Admitting: Family Medicine

## 2017-01-05 DIAGNOSIS — H60592 Other noninfective acute otitis externa, left ear: Secondary | ICD-10-CM | POA: Diagnosis not present

## 2017-01-05 DIAGNOSIS — J029 Acute pharyngitis, unspecified: Secondary | ICD-10-CM | POA: Diagnosis not present

## 2017-01-07 ENCOUNTER — Ambulatory Visit (INDEPENDENT_AMBULATORY_CARE_PROVIDER_SITE_OTHER): Payer: BLUE CROSS/BLUE SHIELD | Admitting: Neurology

## 2017-01-07 DIAGNOSIS — R3915 Urgency of urination: Secondary | ICD-10-CM

## 2017-01-07 DIAGNOSIS — G5622 Lesion of ulnar nerve, left upper limb: Secondary | ICD-10-CM

## 2017-01-07 DIAGNOSIS — M542 Cervicalgia: Secondary | ICD-10-CM

## 2017-01-07 DIAGNOSIS — R202 Paresthesia of skin: Secondary | ICD-10-CM

## 2017-01-07 DIAGNOSIS — Z0289 Encounter for other administrative examinations: Secondary | ICD-10-CM

## 2017-01-07 NOTE — Procedures (Signed)
Full Name: [name] Gender: Female MRN #: [id number] Date of Birth: 1981/01/06    Visit Date: 01/07/17 12:31 Age: 36 Years 82 Months Old Examining Physician: Marcial Pacas, MD  Referring Physician: Krista Blue, MD History: 36 year old right-handed female, presented with intermittent left fourth and fifth finger paresthesia,  Summary of the tests:  Nerve conduction study: Bilateral median sensory and motor responses were normal.  Left ulnar sensory responses showed 50% snap amplitude drop in comparison with the right side, but still within normal limit.  Right ulnar motor responses were normal.  Left ulnar motor responses showed 28% C map amplitude drop at above elbow site, with 15 m/s velocity drop across the left elbow  Right sural, superficial peroneal sensory responses were normal.  Right peroneal EDB and tibial motor responses were normal.   Electromyography: Selective needle examinations were performed of left upper extremity muscles, left cervical paraspinal muscles, that was normal, right lower extremity muscles, right lumbosacral paraspinal muscles that was normal.  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  There is no evidence of large fiber peripheral neuropathy, or right lumbosacral radiculopathy.    ------------------------------- Marcial Pacas, M.D.  [facility] [place], [place] [address] Tel: [phone] Fax: [phone]        [facility]    Nerve / Sites Muscle Latency Ref. Amplitude Ref. Rel Amp Segments Distance Velocity Ref. Area    ms ms mV mV %  cm m/s m/s mVms  L Median - APB     Wrist APB 3.1 ?4.4 5.2 ?4.0 100 Wrist - APB 7   18.0     Upper arm APB 6.4  5.3  103 Upper arm - Wrist 19 58 ?49 18.9  R Median - APB     Wrist APB 3.5 ?4.4 4.9 ?4.0 100 Wrist - APB 7   17.4     Upper arm APB 7.0  4.6   92.4 Upper arm - Wrist 19 55 ?49 16.0  L Ulnar - ADM     Wrist ADM 2.4 ?3.3 9.6 ?6.0 100 Wrist - ADM 7   27.8     B.Elbow ADM 5.1  8.8  91.9 B.Elbow - Wrist 16 60 ?49 27.3     A.Elbow ADM 7.3  6.3  72 A.Elbow - B.Elbow 10 45 ?49 20.4         A.Elbow - Wrist      R Ulnar - ADM     Wrist ADM 2.8 ?3.3 7.5 ?6.0 100 Wrist - ADM 7   17.3     B.Elbow ADM 5.4  7.3  97.9 B.Elbow - Wrist 16 61 ?49 18.2     A.Elbow ADM 7.0  7.0  95.2 A.Elbow - B.Elbow 10 62 ?49 17.5         A.Elbow - Wrist      R Peroneal - EDB     Ankle EDB 5.0 ?6.5 4.4 ?2.0 100 Ankle - EDB 9   14.8     Fib head EDB 10.3  4.5  103 Fib head - Ankle 27 51 ?44 15.6     Pop fossa EDB 12.3  4.1  90.4 Pop fossa - Fib head 10 49 ?44 13.2         Pop fossa - Ankle      R Tibial - AH  Ankle AH 3.8 ?5.8 10.8 ?4.0 100 Ankle - AH 9   31.7     Pop fossa AH 11.1  9.5  88.3 Pop fossa - Ankle 32 43 ?41 26.6                 SNC    Nerve / Sites Rec. Site Peak Lat Ref.  Amp Ref. Segments Distance    ms ms V V  cm  R Sural - Ankle (Calf)     Calf Ankle 3.4 ?4.4 25 ?6 Calf - Ankle 14  R Superficial peroneal - Ankle     Lat leg Ankle 4.0 ?4.4 8 ?6 Lat leg - Ankle 14  L Median - Orthodromic (Dig II, Mid palm)     Dig II Wrist 2.8 ?3.4 14 ?10 Dig II - Wrist 13  R Median - Orthodromic (Dig II, Mid palm)     Dig II Wrist 2.8 ?3.4 12 ?10 Dig II - Wrist 13  L Ulnar - Orthodromic, (Dig V, Mid palm)     Dig V Wrist 2.7 ?3.1 7 ?5 Dig V - Wrist 11  R Ulnar - Orthodromic, (Dig V, Mid palm)     Dig V Wrist 2.6 ?3.1 14 ?5 Dig V - Wrist 7                 F  Wave    Nerve F Lat Ref.   ms ms  R Tibial - AH 44.6 ?56.0  L Ulnar - ADM 26.1 ?32.0  R Ulnar - ADM 25.3 ?32.0           EMG full       EMG Summary Table    Spontaneous MUAP Recruitment  Muscle IA Fib PSW Fasc Other Amp Dur. Poly Pattern  L. First dorsal interosseous Normal None None None _______ Normal Normal Normal Normal  L. Pronator teres Normal None None None _______ Normal  Normal Normal Normal  L. Flexor carpi ulnaris Normal None None None _______ Normal Normal Normal Normal  L. Biceps brachii Normal None None None _______ Normal Normal Normal Normal  L. Deltoid Normal None None None _______ Normal Normal Normal Normal  L. Triceps brachii Normal None None None _______ Normal Normal Normal Normal  L. Cervical paraspinals Normal None None None _______ Normal Normal Normal Normal  R. Tibialis  Anterior Normal None None None _______ Normal Normal Normal Normal  R. Medial Gastrocneums Normal None None None _______ Normal Normal Normal Normal  R. Vastus Lateralis Normal None None None _______ Normal Normal Normal Normal  R. Lumbar  Paraspinals Normal None None None _______ Normal Normal Normal Normal

## 2017-01-10 ENCOUNTER — Telehealth: Payer: BLUE CROSS/BLUE SHIELD | Admitting: Family

## 2017-01-10 DIAGNOSIS — J019 Acute sinusitis, unspecified: Secondary | ICD-10-CM

## 2017-01-10 MED ORDER — DOXYCYCLINE HYCLATE 100 MG PO TABS: 100.0000 mg | ORAL_TABLET | Freq: Two times a day (BID) | ORAL | 0 refills | Status: DC

## 2017-01-10 NOTE — Progress Notes (Signed)

## 2017-01-12 ENCOUNTER — Ambulatory Visit: Payer: Self-pay | Admitting: Family Medicine

## 2017-01-12 ENCOUNTER — Ambulatory Visit (INDEPENDENT_AMBULATORY_CARE_PROVIDER_SITE_OTHER): Payer: BLUE CROSS/BLUE SHIELD | Admitting: Medical

## 2017-01-12 ENCOUNTER — Encounter: Payer: Self-pay | Admitting: Primary Care

## 2017-01-12 ENCOUNTER — Encounter: Payer: Self-pay | Admitting: Medical

## 2017-01-12 VITALS — BP 129/77 | HR 80 | Temp 97.6°F | Resp 16 | Ht 64.0 in | Wt 169.8 lb

## 2017-01-12 DIAGNOSIS — H60503 Unspecified acute noninfective otitis externa, bilateral: Secondary | ICD-10-CM

## 2017-01-12 DIAGNOSIS — J0100 Acute maxillary sinusitis, unspecified: Secondary | ICD-10-CM

## 2017-01-12 DIAGNOSIS — R21 Rash and other nonspecific skin eruption: Secondary | ICD-10-CM

## 2017-01-12 MED ORDER — CLOTRIMAZOLE-BETAMETHASONE 1-0.05 % EX CREA: 1.0000 "application " | TOPICAL_CREAM | Freq: Two times a day (BID) | CUTANEOUS | 0 refills | Status: DC

## 2017-01-12 MED ORDER — AZITHROMYCIN 250 MG PO TABS
ORAL_TABLET | ORAL | 0 refills | Status: DC
Start: 2017-01-12 — End: 2017-02-02

## 2017-01-12 MED ORDER — NEOMYCIN-POLYMYXIN-HC 1 % OT SOLN: 3.0000 [drp] | Freq: Four times a day (QID) | OTIC | 0 refills | Status: DC

## 2017-01-12 MED FILL — AZITHROMYCIN 250 MG TABLET: 250 | 5 days supply | Qty: 6 | Fill #0

## 2017-01-12 MED FILL — CLOTRIMAZOLE-BETAMETHASONE: 1-0.05 | 15 days supply | Qty: 30 | Fill #0

## 2017-01-12 NOTE — Progress Notes (Signed)
Subjective:    Patient ID: [name], female    DOB: 1980/03/24, 36 y.o.   MRN: [id number]  HPI  Pt in with rash under both underarms.   Pt states just recently she went to urgent care last week  for some ear pressure and mild pain. She also had st so rapid strep test was negative and send out was negative. Then got worse after UC treatment and eventually had to have an e-visit. Pt felt some mild irritated areas inside her ears.(years ago told had eczema of her ears) Then she eventually got more sinus pressure.    She decided to do  e-visit. was diagnosed with sinus infection(since starting doxy has less sinus pressure). Husband and son had some nasal congestion and st.    Pt rash on underarms since past thursday. Today rash itching. Pt has been on doxycycline for 3 days and speculates maybe doxy caused rash. Pt considered maybe deoderant allergy but no new detergents used.  LMP- started this Monday.  Pt does not has [place] azithromycin within last year and no allergy.    Review of Systems  Constitutional: Negative for chills, diaphoresis, fatigue and fever.  HENT: Positive for congestion, sinus pressure and sinus pain. Negative for facial swelling, hearing loss, postnasal drip, sore throat and tinnitus.   Respiratory: Negative for cough, chest tightness, shortness of breath and wheezing.   Cardiovascular: Negative for palpitations.  Gastrointestinal: Negative for anal bleeding.  Genitourinary: Negative for difficulty urinating, dysuria, flank pain, frequency, genital sores and hematuria.  Musculoskeletal: Negative for back pain, myalgias and neck pain.  Skin: Positive for rash.  Neurological: Negative for dizziness, seizures, speech difficulty, weakness and light-headedness.  Hematological: Negative for adenopathy. Does not bruise/bleed easily.  Psychiatric/Behavioral: Negative for behavioral problems, confusion, self-injury and suicidal ideas. The patient is not nervous/anxious.     Past Medical History:  Diagnosis Date  . ADD (attention deficit disorder)   . Endometriosis   . Family history of malignant neoplasm of gastrointestinal tract   . Fatigue   . Fibromyalgia   . GERD (gastroesophageal reflux disease)   . Kidney stone   . Migraine   . Personal history of colonic polyps 06/01/2006   sessil serrated adenoma  . Vitamin B12 deficiency      Social History   Socioeconomic History  . Marital status: Married    Spouse name: Not on file  . Number of children: 2  . Years of education: Not on file  . Highest education level: Not on file  Social Needs  . Financial resource strain: Not on file  . Food insecurity - worry: Not on file  . Food insecurity - inability: Not on file  . Transportation needs - medical: Not on file  . Transportation needs - non-medical: Not on file  Occupational History  . Occupation: Product/process development scientist: [employer]  Tobacco Use  . Smoking status: Former Smoker    Packs/day: 0.25    Last attempt to quit: 01/25/2005    Years since quitting: 11.9  . Smokeless tobacco: Never Used  Substance and Sexual Activity  . Alcohol use: Yes    Comment: rare  . Drug use: No  . Sexual activity: Yes    Birth control/protection: Surgical    Comment: tubal ligation  Other Topics Concern  . Not on file  Social History Narrative   Lives with husband and son, 1 dog   Occupation: Sales promotion account executive   Activity: yoga, pilates, walks  3x/wk 2 mi   Diet: gluten free diet, avoids fried foods, good water 32-64 oz, fruits/vegetables daily    Past Surgical History:  Procedure Laterality Date  . BREAST SURGERY  2004   breast reduction  . CHOLECYSTECTOMY    . LAPAROSCOPY  11/2004   endometriosis  . OVARIAN CYST SURGERY    . TUBAL LIGATION  2008  . WISDOM TOOTH EXTRACTION     with four other teeth as well    Family History  Problem Relation Age of Onset  . Cervical cancer Mother 46  . Hypertension Mother   . Diabetes Mother   .  Kidney cancer Mother 63  . Cancer Mother        Melanoma  . Kidney failure Brother 61       s/p transplant ?due to PCKD or renal stones  . Wilson's disease Father        liver transplant  . Ulcerative colitis Father   . Liver disease Father   . Colon polyps Sister   . Breast cancer Maternal Aunt   . Breast cancer Maternal Grandmother   . Colon cancer Paternal Grandfather   . Esophageal cancer Neg Hx   . Stomach cancer Neg Hx   . Rectal cancer Neg Hx     Allergies  Allergen Reactions  . Strawberry Extract Anaphylaxis and Hives  . Clindamycin     REACTION: hives, angioedema  . Gabapentin     Dizzy/ worse headache  . Penicillins     REACTION: hives, angioedema    Current Outpatient Medications on File Prior to Visit  Medication Sig Dispense Refill  . doxycycline (VIBRA-TABS) 100 MG tablet Take 1 tablet (100 mg total) by mouth 2 (two) times daily. 20 tablet 0   No current facility-administered medications on file prior to visit.     BP 129/77   Pulse 80   Temp 97.6 F (36.4 C) (Oral)   Resp 16   Ht 5\' 4"  (1.626 m)   Wt 169 lb 12.8 oz (77 kg)   SpO2 99%   BMI 29.15 kg/m       Objective:   Physical Exam  General  Mental Status - Alert. General Appearance - Well groomed. Not in acute distress.  Skin Rashes- No Rashes.  HEENT Head- Normal. Ear Auditory Canal - Left- mild swelling to canals. Right - mild swelling to canals..Tympanic Membrane- Left- Normal. Right- Normal. Eye Sclera/Conjunctiva- Left- Normal. Right- Normal. Nose & Sinuses Nasal Mucosa- Left-  Boggy and Congested. Right-  Boggy and  Congested.Bilateral maxillary and frontal sinus pressure. Mouth & Throat Lips: Upper Lip- Normal: no dryness, cracking, pallor, cyanosis, or vesicular eruption. Lower Lip-Normal: no dryness, cracking, pallor, cyanosis or vesicular eruption. Buccal Mucosa- Bilateral- No Aphthous ulcers. Oropharynx- No Discharge or Erythema. Tonsils: Characteristics- Bilateral- No  Erythema or Congestion. Size/Enlargement- Bilateral- No enlargement. Discharge- bilateral-None.  Neck Neck- Supple. No Masses.   Chest and Lung Exam Auscultation: Breath Sounds:-Clear even and unlabored.  Cardiovascular Auscultation:Rythm- Regular, rate and rhythm. Murmurs & Other Heart Sounds:Ausculatation of the heart reveal- No Murmurs.  Lymphatic Head & Neck General Head & Neck Lymphatics: Bilateral: Description- No Localized lymphadenopathy.  Skin- mild skin rash left axilla. No warmth. No dc. No folliculitis.      Assessment & Plan:  You do appear to have persisting sinus infection will rx azithromycin. Also can use flonase in am and saline nasal spray at night.  For appearance of ear canal inflammation rx cortisporin otic drops.  For rash in axillary area. Will rx lotrisone.(consider using different deoderant)  Follow up in 7-10 days or as needed.

## 2017-01-12 NOTE — Patient Instructions (Addendum)
You do appear to have persisting sinus infection will rx azithromycin. Also can use flonase in am and saline nasal spray at night.  For appearance of ear canal inflammation rx cortisporin otic drops.  For rash in axillary area. Will rx lotrisone.(consider using different deoderant)  Follow up in 7-10 days or as needed.

## 2017-01-20 ENCOUNTER — Telehealth: Payer: Self-pay | Admitting: Family Medicine

## 2017-01-20 DIAGNOSIS — Z0000 Encounter for general adult medical examination without abnormal findings: Secondary | ICD-10-CM

## 2017-01-20 DIAGNOSIS — E538 Deficiency of other specified B group vitamins: Secondary | ICD-10-CM

## 2017-01-20 NOTE — Telephone Encounter (Signed)
-----   Message from [name] sent at 01/19/2017 11:16 AM EST ----- Regarding: Lab orders for Wednesday, 1.2.19 Patient is scheduled for CPX labs, please order future labs, Thanks , [name]

## 2017-01-21 ENCOUNTER — Inpatient Hospital Stay: Admission: RE | Admit: 2017-01-21 | Payer: BLUE CROSS/BLUE SHIELD | Source: Ambulatory Visit

## 2017-01-21 ENCOUNTER — Telehealth: Payer: Self-pay | Admitting: Neurology

## 2017-01-21 NOTE — Telephone Encounter (Signed)
Spoke to Shev[place] at [facility] had ran out on MRI called [facility] and they denied MRI had to faxed in Clinical notes to [phone] .   Called and patient and left her a detailed voice mail about her MRI being CX. Ena Dawley Is CX

## 2017-01-21 NOTE — Telephone Encounter (Signed)
Ena Dawley called from GI and relayed patient was scheduled for MRI 01/21/2017 at 7:00 pm. And EXP had ran out on MRI.  I called [facility] and they denied request I had to send in clinical notes. Fax [phone] .  I called Patient and left her a detailed message relaying her apt has been CX . Relayed I have her cl

## 2017-01-26 ENCOUNTER — Other Ambulatory Visit (INDEPENDENT_AMBULATORY_CARE_PROVIDER_SITE_OTHER): Payer: BLUE CROSS/BLUE SHIELD

## 2017-01-26 DIAGNOSIS — E538 Deficiency of other specified B group vitamins: Secondary | ICD-10-CM

## 2017-01-26 DIAGNOSIS — Z0000 Encounter for general adult medical examination without abnormal findings: Secondary | ICD-10-CM | POA: Diagnosis not present

## 2017-01-26 NOTE — Telephone Encounter (Signed)
I called [facility] this morning and they informed me that it isn't approved and pending a peer to peer. The phone number for the peer to peer is [phone]. The member ID is [id number] and the DOB is [birth year]. They did inform me that it close's this afternoon at 4 pm.

## 2017-01-26 NOTE — Telephone Encounter (Signed)
Peer to peer review on MRI cervical spine.  [phone]  Valid through 1/26 2019.

## 2017-01-27 ENCOUNTER — Other Ambulatory Visit: Payer: BLUE CROSS/BLUE SHIELD

## 2017-01-27 LAB — CBC WITH DIFFERENTIAL/PLATELET
Basophils Absolute: 0 10*3/uL (ref 0.0–0.1)
Basophils Relative: 0.6 % (ref 0.0–3.0)
EOS ABS: 0.1 10*3/uL (ref 0.0–0.7)
Eosinophils Relative: 1.3 % (ref 0.0–5.0)
HCT: 38.7 % (ref 36.0–46.0)
HEMOGLOBIN: 12.7 g/dL (ref 12.0–15.0)
Lymphocytes Relative: 29.2 % (ref 12.0–46.0)
Lymphs Abs: 2.2 10*3/uL (ref 0.7–4.0)
MCHC: 32.7 g/dL (ref 30.0–36.0)
MCV: 91.2 fl (ref 78.0–100.0)
MONO ABS: 0.7 10*3/uL (ref 0.1–1.0)
Monocytes Relative: 9.7 % (ref 3.0–12.0)
Neutro Abs: 4.4 10*3/uL (ref 1.4–7.7)
Neutrophils Relative %: 59.2 % (ref 43.0–77.0)
Platelets: 236 10*3/uL (ref 150.0–400.0)
RBC: 4.24 Mil/uL (ref 3.87–5.11)
RDW: 13.3 % (ref 11.5–15.5)
WBC: 7.5 10*3/uL (ref 4.0–10.5)

## 2017-01-27 LAB — COMPREHENSIVE METABOLIC PANEL
ALBUMIN: 4.2 g/dL (ref 3.5–5.2)
ALK PHOS: 45 U/L (ref 39–117)
ALT: 8 U/L (ref 0–35)
AST: 11 U/L (ref 0–37)
BUN: 9 mg/dL (ref 6–23)
CO2: 27 mEq/L (ref 19–32)
CREATININE: 0.63 mg/dL (ref 0.40–1.20)
Calcium: 9.3 mg/dL (ref 8.4–10.5)
Chloride: 102 mEq/L (ref 96–112)
GFR: 111.86 mL/min (ref >60.00)
Glucose, Bld: 83 mg/dL (ref 70–99)
Potassium: 3.5 mEq/L (ref 3.5–5.1)
SODIUM: 138 meq/L (ref 135–145)
Total Bilirubin: 0.7 mg/dL (ref 0.2–1.2)
Total Protein: 7 g/dL (ref 6.0–8.3)

## 2017-01-27 LAB — LIPID PANEL
Cholesterol: 162 mg/dL (ref 0–200)
HDL: 71.3 mg/dL (ref >39.00)
LDL Cholesterol: 73 mg/dL (ref 0–99)
NonHDL: 90.35
Total CHOL/HDL Ratio: 2
Triglycerides: 87 mg/dL (ref 0.0–149.0)
VLDL: 17.4 mg/dL (ref 0.0–40.0)

## 2017-01-27 LAB — TSH: TSH: 3.28 u[IU]/mL (ref 0.35–4.50)

## 2017-01-27 LAB — VITAMIN B12: VITAMIN B 12: 806 pg/mL (ref 211–911)

## 2017-01-27 NOTE — Telephone Encounter (Signed)
Noted,  Thank you!

## 2017-02-02 ENCOUNTER — Ambulatory Visit (INDEPENDENT_AMBULATORY_CARE_PROVIDER_SITE_OTHER): Payer: BLUE CROSS/BLUE SHIELD | Admitting: Family Medicine

## 2017-02-02 ENCOUNTER — Encounter: Payer: Self-pay | Admitting: Family Medicine

## 2017-02-02 ENCOUNTER — Other Ambulatory Visit (HOSPITAL_COMMUNITY)
Admission: RE | Admit: 2017-02-02 | Discharge: 2017-02-02 | Disposition: A | Discharge status: Home / Self Care | Payer: BLUE CROSS/BLUE SHIELD | Source: Ambulatory Visit | Attending: Family Medicine | Admitting: Family Medicine

## 2017-02-02 VITALS — BP 120/74 | HR 67 | Temp 98.0°F | Ht 63.0 in | Wt 166.0 lb

## 2017-02-02 DIAGNOSIS — N809 Endometriosis, unspecified: Secondary | ICD-10-CM | POA: Diagnosis not present

## 2017-02-02 DIAGNOSIS — Z01419 Encounter for gynecological examination (general) (routine) without abnormal findings: Secondary | ICD-10-CM | POA: Diagnosis not present

## 2017-02-02 DIAGNOSIS — Z0001 Encounter for general adult medical examination with abnormal findings: Secondary | ICD-10-CM | POA: Diagnosis not present

## 2017-02-02 DIAGNOSIS — Z860100 Personal history of colon polyps, unspecified: Secondary | ICD-10-CM

## 2017-02-02 DIAGNOSIS — K9041 Non-celiac gluten sensitivity: Secondary | ICD-10-CM | POA: Diagnosis not present

## 2017-02-02 DIAGNOSIS — E538 Deficiency of other specified B group vitamins: Secondary | ICD-10-CM

## 2017-02-02 DIAGNOSIS — E663 Overweight: Secondary | ICD-10-CM | POA: Diagnosis not present

## 2017-02-02 DIAGNOSIS — N6019 Diffuse cystic mastopathy of unspecified breast: Secondary | ICD-10-CM | POA: Diagnosis not present

## 2017-02-02 DIAGNOSIS — N921 Excessive and frequent menstruation with irregular cycle: Secondary | ICD-10-CM

## 2017-02-02 DIAGNOSIS — Z0000 Encounter for general adult medical examination without abnormal findings: Secondary | ICD-10-CM

## 2017-02-02 NOTE — Patient Instructions (Addendum)
Pap done today  Labs look great Keep eating well and add exercise when you can  Take care of yourself   If you change your mind about a flu shot let us know   I will look into the gyn question re: specialist and let you know   Get your mammogram as planned

## 2017-02-02 NOTE — Progress Notes (Signed)
Subjective:    Patient ID: [name], female    DOB: 04/26/1980, 37 y.o.   MRN: [id number]  HPI  Here for health maintenance exam and to review chronic medical problems    Doing better neurologically    Wt Readings from Last 3 Encounters:  02/02/17 166 lb (75.3 kg)  01/12/17 169 lb 12.8 oz (77 kg)  12/13/16 169 lb (76.7 kg)  eating well - has stuck to the whole 30 plan- that works well for her  Has eliminated dairy and watches gluten as well  No meat except for fish  Feels better and has successfully lost weight  Aims for 5000-10,000 steps per day and joining gym to build muscle  29.41 kg/m   HIV screen -not high risk   Pap 8/14-nl  Has not had one since then  No hx of abn paps  No known exp stds  Monogamous 55 y   C/o of breast tenderness lately-has a mammogram planned  MGM had breast cancer   Flu shot-declines   Tetanus shot 1/10  Hx of colon polyps  Nl colonoscopy 1/13 with 10 y recall    Visual Acuity Screening   Right eye Left eye Both eyes  Without correction: 20 15 20 15    With correction:       H/o endometriosis Thinks it has gotten worse  Dr Kennon Rounds thinks she needs to see a sub specialist  She is not trying to get pregnant , does not want a hysterectomy  Was diagnosed with laparoscopy years ago  Has been tx with lupron and multiple OC  Starting to affect her bowel fxn-pain to defacate A lot of pelvic pain  Menses - very heavy first 2 d with pain (occ misses work)  occ 2 menses per month (frequent)  They vary period to period lately   B12 def  Lab Results  Component Value Date   VITAMINB12 806 01/26/2017   Cholesterol Lab Results  Component Value Date   CHOL 162 01/26/2017   HDL 71.30 01/26/2017   LDLCALC 73 01/26/2017   TRIG 87.0 01/26/2017   CHOLHDL 2 01/26/2017  very good profile   Other labs nl  Lab Results  Component Value Date   WBC 7.5 01/26/2017   HGB 12.7 01/26/2017   HCT 38.7 01/26/2017   MCV 91.2 01/26/2017   PLT 236.0 01/26/2017   Lab Results  Component Value Date   CREATININE 0.63 01/26/2017   BUN 9 01/26/2017   NA 138 01/26/2017   K 3.5 01/26/2017   CL 102 01/26/2017   CO2 27 01/26/2017   Lab Results  Component Value Date   ALT 8 01/26/2017   AST 11 01/26/2017   ALKPHOS 45 01/26/2017   BILITOT 0.7 01/26/2017   Lab Results  Component Value Date   TSH 3.28 01/26/2017    Patient Active Problem List   Diagnosis Date Noted  . Encounter for routine gynecological examination 02/02/2017  . Urinary urgency 12/13/2016  . Neck pain 11/09/2016  . Paresthesia of arm 10/13/2016  . Insomnia 10/13/2016  . Lower extremity edema 09/24/2015  . Eczema 01/29/2015  . Paresthesia 12/11/2014  . Health maintenance examination 05/16/2014  . Paresthesias 05/16/2014  . Hematuria 05/16/2014  . RUQ abdominal pain 12/05/2013  . Unsteadiness on feet 11/28/2013  . Anxiety disorder 03/19/2013  . Pelvic pain in female 01/09/2013  . Heavy menses 01/03/2013  . ADD (attention deficit disorder) 02/29/2012  . Fibromyalgia 03/26/2011  . Myofascial pain 03/19/2011  .  Gluten intolerance 01/22/2011  . Overweight (BMI 25.0-29.9) 11/09/2010  . Fatigue 05/18/2010  . FIBROCYSTIC BREAST DISEASE 03/27/2010  . CAVERNOUS HEMANGIOMA, LIVER 12/03/2009  . PERSONAL HISTORY OF COLONIC POLYPS 12/03/2009  . ALLERGIC RHINITIS 03/09/2008  . URINARY FREQUENCY, CHRONIC 01/31/2007  . B12 deficiency 10/28/2006  . GERD 04/27/2006  . Endometriosis 04/27/2006  . RENAL CALCULUS, HX OF 04/27/2006  . MIGRAINES, HX OF 04/27/2006   Past Medical History:  Diagnosis Date  . ADD (attention deficit disorder)   . Endometriosis   . Family history of malignant neoplasm of gastrointestinal tract   . Fatigue   . Fibromyalgia   . GERD (gastroesophageal reflux disease)   . Kidney stone   . Migraine   . Personal history of colonic polyps 06/01/2006   sessil serrated adenoma  . Vitamin B12 deficiency    Past Surgical History:    Procedure Laterality Date  . BREAST SURGERY  2004   breast reduction  . CHOLECYSTECTOMY    . LAPAROSCOPY  11/2004   endometriosis  . OVARIAN CYST SURGERY    . TUBAL LIGATION  2008  . WISDOM TOOTH EXTRACTION     with four other teeth as well   Social History   Tobacco Use  . Smoking status: Former Smoker    Packs/day: 0.25    Last attempt to quit: 01/25/2005    Years since quitting: 12.0  . Smokeless tobacco: Never Used  Substance Use Topics  . Alcohol use: Yes    Comment: rare  . Drug use: No   Family History  Problem Relation Age of Onset  . Cervical cancer Mother 5  . Hypertension Mother   . Diabetes Mother   . Kidney cancer Mother 44  . Cancer Mother        Melanoma  . Kidney failure Brother 65       s/p transplant ?due to PCKD or renal stones  . Wilson's disease Father        liver transplant  . Ulcerative colitis Father   . Liver disease Father   . Colon polyps Sister   . Breast cancer Maternal Aunt   . Breast cancer Maternal Grandmother   . Colon cancer Paternal Grandfather   . Esophageal cancer Neg Hx   . Stomach cancer Neg Hx   . Rectal cancer Neg Hx    Allergies  Allergen Reactions  . Strawberry Extract Anaphylaxis and Hives  . Clindamycin     REACTION: hives, angioedema  . Doxycycline     axillary rash   . Gabapentin     Dizzy/ worse headache  . Penicillins     REACTION: hives, angioedema   Current Outpatient Medications on File Prior to Visit  Medication Sig Dispense Refill  . clotrimazole-betamethasone (LOTRISONE) cream Apply 1 application topically 2 (two) times daily. 30 g 0  . NEOMYCIN-POLYMYXIN-HYDROCORTISONE (CORTISPORIN) 1 % SOLN OTIC solution Place 3 drops into both ears every 6 (six) hours. (Patient not taking: Reported on 02/02/2017) 10 mL 0   No current facility-administered medications on file prior to visit.     Review of Systems  Constitutional: Positive for fatigue. Negative for activity change, appetite change, fever and  unexpected weight change.  HENT: Negative for congestion, ear pain, rhinorrhea, sinus pressure and sore throat.   Eyes: Negative for pain, redness and visual disturbance.  Respiratory: Negative for cough, shortness of breath and wheezing.   Cardiovascular: Negative for chest pain and palpitations.  Gastrointestinal: Negative for abdominal pain, blood in stool,  constipation and diarrhea.  Endocrine: Negative for polydipsia and polyuria.  Genitourinary: Positive for menstrual problem, pelvic pain and vaginal bleeding. Negative for dysuria, frequency and urgency.  Musculoskeletal: Positive for arthralgias. Negative for back pain and myalgias.  Skin: Negative for pallor and rash.  Allergic/Immunologic: Negative for environmental allergies.  Neurological: Negative for dizziness, syncope and headaches.  Hematological: Negative for adenopathy. Does not bruise/bleed easily.  Psychiatric/Behavioral: Positive for decreased concentration. Negative for dysphoric mood. The patient is not nervous/anxious.        Objective:   Physical Exam  Constitutional: She appears well-developed and well-nourished. No distress.  overwt and well appearing   HENT:  Head: Normocephalic and atraumatic.  Right Ear: External ear normal.  Left Ear: External ear normal.  Mouth/Throat: Oropharynx is clear and moist.  Eyes: Conjunctivae and EOM are normal. Pupils are equal, round, and reactive to light. No scleral icterus.  Neck: Normal range of motion. Neck supple. No JVD present. Carotid bruit is not present. No thyromegaly present.  Cardiovascular: Normal rate, regular rhythm, normal heart sounds and intact distal pulses. Exam reveals no gallop.  Pulmonary/Chest: Effort normal and breath sounds normal. No respiratory distress. She has no wheezes. She exhibits no tenderness.  Abdominal: Soft. Bowel sounds are normal. She exhibits no distension, no abdominal bruit and no mass. There is tenderness. There is no rebound and  no guarding.  Mild suprapubic tenderness  Genitourinary: No breast swelling, tenderness, discharge or bleeding.  Genitourinary Comments: Breast exam: No mass, nodules, thickening, tenderness, bulging, retraction, inflamation, nipple discharge or skin changes noted.  No axillary or clavicular LA.              Anus appears normal w/o hemorrhoids or masses     External genitalia : nl appearance and hair distribution/no lesions     Urethral meatus : nl size, no lesions or prolapse     Urethra: no masses, tenderness or scarring    Bladder : no masses or tenderness     Vagina: nl general appearance, no discharge or  Lesions, no significant cystocele  or rectocele     Cervix: no lesions/ discharge or friability  (no CMT)    Uterus: nl size, contour, position, and mobility (not fixed) , non tender    Adnexa : no masses, tenderness, enlargement or nodularity  Vaguely uncomfortable during pelvic exam/ mild overall        Musculoskeletal: Normal range of motion. She exhibits no edema or tenderness.  Lymphadenopathy:    She has no cervical adenopathy.  Neurological: She is alert. She has normal reflexes. No cranial nerve deficit. She exhibits normal muscle tone. Coordination normal.  Skin: Skin is warm and dry. No rash noted. No erythema. No pallor.  Solar lentigines diffusely   Psychiatric: She has a normal mood and affect.          Assessment & Plan:   Problem List Items Addressed This Visit      Other   B12 deficiency    Lab Results  Component Value Date   [id number] 062 01/26/2017   Doing well with current supplementation       Encounter for routine gynecological examination    Exam and pap today  Continues to struggle with endometriosis Looking for a sub specialist       Relevant Orders   Cytology - PAP   Endometriosis    Pelvic pain/ irregular and heavy menses and bowel symptoms  Pt is looking for a sub specialist if possible  Her last gyn  recommended this but did not have a name  Will look into our options and refer       FIBROCYSTIC BREAST DISEASE    More tenderness lately  Avoids caffeine  Disc imp of supportive bra  Has mammogram planned at [facility]       Gluten intolerance    Doing well with current diet       Health maintenance examination - Primary    Reviewed health habits including diet and exercise and skin cancer prevention Reviewed appropriate screening tests for age  Also reviewed health mt list, fam hx and immunization status , as well as social and family history   See HPI Gyn exam with pap done today  Declines flu shots  Good health habits Pt has mammogram planned for breast tenderness      Heavy menses    This continues  Endometriosis-has failed hormonal tx       Overweight (BMI 25.0-29.9)    Doing well for wt loss with her current diet/exercise  Commended       PERSONAL HISTORY OF COLONIC POLYPS    Last colonoscopy 1/13 nl with 10 y recall

## 2017-02-03 NOTE — Assessment & Plan Note (Signed)
Doing well with current diet

## 2017-02-03 NOTE — Assessment & Plan Note (Signed)
More tenderness lately  Avoids caffeine  Disc imp of supportive bra  Has mammogram planned at [facility]

## 2017-02-03 NOTE — Assessment & Plan Note (Signed)
This continues  Endometriosis-has failed hormonal tx

## 2017-02-03 NOTE — Assessment & Plan Note (Signed)
Doing well for wt loss with her current diet/exercise  Commended

## 2017-02-03 NOTE — Assessment & Plan Note (Signed)
Pelvic pain/ irregular and heavy menses and bowel symptoms  Pt is looking for a sub specialist if possible  Her last gyn recommended this but did not have a name  Will look into our options and refer

## 2017-02-03 NOTE — Assessment & Plan Note (Signed)
Last colonoscopy 1/13 nl with 10 y recall

## 2017-02-03 NOTE — Assessment & Plan Note (Signed)
Lab Results  Component Value Date   [id number] 336 01/26/2017   Doing well with current supplementation

## 2017-02-03 NOTE — Assessment & Plan Note (Signed)
Exam and pap today  Continues to struggle with endometriosis Looking for a sub specialist

## 2017-02-03 NOTE — Assessment & Plan Note (Signed)
Reviewed health habits including diet and exercise and skin cancer prevention Reviewed appropriate screening tests for age  Also reviewed health mt list, fam hx and immunization status , as well as social and family history   See HPI Gyn exam with pap done today  Declines flu shots  Good health habits Pt has mammogram planned for breast tenderness

## 2017-02-05 ENCOUNTER — Other Ambulatory Visit: Payer: BLUE CROSS/BLUE SHIELD

## 2017-02-07 ENCOUNTER — Encounter: Payer: Self-pay | Admitting: Family Medicine

## 2017-02-07 ENCOUNTER — Telehealth: Payer: Self-pay | Admitting: Family Medicine

## 2017-02-07 DIAGNOSIS — N809 Endometriosis, unspecified: Secondary | ICD-10-CM

## 2017-02-07 NOTE — Telephone Encounter (Signed)
It looks like pt has already sent Dr. Glori Bickers a mychart message about this and she does want to proceed with referral

## 2017-02-07 NOTE — Telephone Encounter (Signed)
Please let pt know I spoke to a gyn regarding endometriosis  There are no sub specialists outside [facility] but she mentioned that [facility] has doctors very experienced medically and surgically [place] the condition and that would be a good place to start   If she would like a referral there or to [facility] please let me know   She also mentioned that there is a laparoscopic endometriosis center in [place] (but I doubt she wants to go that far)   Thanks

## 2017-02-07 NOTE — Telephone Encounter (Signed)
I did the ref for [facility]  Will route to [facility]

## 2017-02-08 LAB — CYTOLOGY - PAP
Adequacy: ABSENT
DIAGNOSIS: NEGATIVE
HPV (WINDOPATH): NOT DETECTED

## 2017-02-10 NOTE — Telephone Encounter (Signed)
Called the patient and [facility]

## 2017-02-11 ENCOUNTER — Ambulatory Visit: Payer: BLUE CROSS/BLUE SHIELD | Admitting: Primary Care

## 2017-04-12 DIAGNOSIS — Z6829 Body mass index (BMI) 29.0-29.9, adult: Secondary | ICD-10-CM | POA: Diagnosis not present

## 2017-04-12 DIAGNOSIS — N809 Endometriosis, unspecified: Secondary | ICD-10-CM | POA: Diagnosis not present

## 2017-04-19 ENCOUNTER — Encounter: Payer: Self-pay | Admitting: Family Medicine

## 2017-04-19 DIAGNOSIS — R1310 Dysphagia, unspecified: Secondary | ICD-10-CM

## 2017-04-19 DIAGNOSIS — R1319 Other dysphagia: Secondary | ICD-10-CM

## 2017-04-19 DIAGNOSIS — K219 Gastro-esophageal reflux disease without esophagitis: Secondary | ICD-10-CM

## 2017-04-20 DIAGNOSIS — N83209 Unspecified ovarian cyst, unspecified side: Secondary | ICD-10-CM | POA: Diagnosis not present

## 2017-04-21 ENCOUNTER — Encounter: Payer: Self-pay | Admitting: Gastroenterology

## 2017-04-21 DIAGNOSIS — R1310 Dysphagia, unspecified: Secondary | ICD-10-CM | POA: Insufficient documentation

## 2017-04-21 NOTE — Telephone Encounter (Signed)
Ref done  Will route to [facility] 

## 2017-05-03 ENCOUNTER — Other Ambulatory Visit: Payer: Self-pay | Admitting: Obstetrics and Gynecology

## 2017-05-11 ENCOUNTER — Ambulatory Visit: Payer: BLUE CROSS/BLUE SHIELD | Admitting: Gastroenterology

## 2017-05-17 ENCOUNTER — Other Ambulatory Visit: Payer: Self-pay | Admitting: Obstetrics and Gynecology

## 2017-06-03 ENCOUNTER — Encounter: Payer: Self-pay | Admitting: Gastroenterology

## 2017-06-07 ENCOUNTER — Ambulatory Visit: Payer: Managed Care, Other (non HMO) | Admitting: Gastroenterology

## 2017-06-10 ENCOUNTER — Telehealth: Payer: Self-pay

## 2017-06-10 NOTE — Telephone Encounter (Signed)
Left message for patient to call Jacarra Bobak back in regards to a referral-Dechelle Attaway V [place], RMA   

## 2017-06-16 ENCOUNTER — Encounter: Payer: Self-pay | Admitting: Family Medicine

## 2017-06-17 ENCOUNTER — Telehealth: Payer: Self-pay | Admitting: Family Medicine

## 2017-06-17 MED ORDER — NORETHINDRONE ACETATE 5 MG PO TABS: 5.0000 mg | ORAL_TABLET | Freq: Every day | ORAL | 0 refills | Status: DC

## 2017-06-17 NOTE — Telephone Encounter (Signed)
Sent norethindrone 5 d supply

## 2017-07-29 ENCOUNTER — Ambulatory Visit: Payer: Managed Care, Other (non HMO) | Admitting: Nurse Practitioner

## 2017-08-02 ENCOUNTER — Encounter: Payer: Self-pay | Admitting: Family Medicine

## 2017-08-31 ENCOUNTER — Encounter: Payer: Self-pay | Admitting: Family Medicine

## 2017-08-31 DIAGNOSIS — Z0000 Encounter for general adult medical examination without abnormal findings: Secondary | ICD-10-CM

## 2017-08-31 DIAGNOSIS — N809 Endometriosis, unspecified: Secondary | ICD-10-CM

## 2017-08-31 DIAGNOSIS — R102 Pelvic and perineal pain: Secondary | ICD-10-CM

## 2017-09-02 NOTE — Telephone Encounter (Signed)
Referral for GYN   Please let pt know I put it in  I will route to [facility]

## 2017-09-12 ENCOUNTER — Ambulatory Visit: Payer: Managed Care, Other (non HMO) | Admitting: Obstetrics & Gynecology

## 2017-09-22 ENCOUNTER — Ambulatory Visit: Payer: Managed Care, Other (non HMO) | Admitting: Obstetrics & Gynecology

## 2017-11-07 ENCOUNTER — Other Ambulatory Visit: Payer: Self-pay

## 2017-11-07 NOTE — Telephone Encounter (Signed)
Per chart review tab pt seen at [facility] ED on 11/06/17.

## 2017-11-07 NOTE — Telephone Encounter (Signed)
Left VM requesting pt to call the office back, CRM created 

## 2017-11-07 NOTE — Telephone Encounter (Signed)
Pt. returned call to office.  Stated she was started on ear gtts., and Bactrim DS, by the ER physician at [facility].  Reported she has not started the Bactrim DS, because she is waiting to take it with her supper tonight.  Reported last temperature was 100.5, taken at approx. 8:00 PM on 10/13.  Stated she feels a little better, with exception of pain with chewing, and has no hearing in left ear, and only about 20 % hearing in the right ear.  Per Dr. Marliss Coots note, offered to schedule the pt. to be eval., tomorrow, at [facility].  The pt. stated she is going to return to work tomorrow, and requested an appt. at [place], with Mackie Pai, PA.  Due to availability of appts. On 10/15, the pt. requested to schedule appt. on 10/16.  Appt. Sched. 10/16 @ 1:20 PM.  Strongly encouraged to get started on the Bactrim ASAP.  Advised to call if worsening pain, fever >101.9, or any other concerning symptoms. Pt. Verb. Understanding.  Advised will send note back to Dr. Glori Bickers to make aware of the plan.

## 2017-11-07 NOTE — Telephone Encounter (Signed)
I can see where she was in the ED-but limited notes (I do not see an exam)    No avail with me today- ? If anyone else has openings  She has multiple med allergies Please send in bactrim (pended-unsure which pharmacy she prefers)   Have her f/u this week

## 2017-11-07 NOTE — Telephone Encounter (Signed)
PLEASE NOTE: All timestamps contained within this report are represented as [place] Standard Time. CONFIDENTIALTY NOTICE: This fax transmission is intended only for the addressee. It contains information that is legally privileged, confidential or otherwise protected from use or disclosure. If you are not the intended recipient, you are strictly prohibited from reviewing, disclosing, copying using or disseminating any of this information or taking any action in reliance on or regarding this information. If you have received this fax in error, please notify us immediately by telephone so that we can arrange for its return to [place]. Phone: [phone], Toll-Free: [phone], Fax: [phone] Page: 1 of 2 Call Id: [id number] [place] Patient Name: [name] Gender: Female DOB: 10/22/1980 Age: 37 Y 22 M 9 D Return Phone Number: [phone] (Primary) Address: City/State/Zip: [place] [address] Client [facility] Night - Client Client Site [facility] Tower, Roque Lias - MD Contact Type Call Who Is Calling Patient / Member / Family / Caregiver Call Type Triage / Clinical Relationship To Patient Self Return Phone Number [phone] (Primary) Chief Complaint Ear Discharge Reason for Call Symptomatic / Request for [place] states that Thursday her left ear was swollen infected and now the right ear is infected as well. Ear drops are not helping, right ear was draining all night Translation No Nurse Assessment Nurse: Nicki Reaper, RN, Malachy Mood Date/Time (Eastern Time): 11/06/2017 8:33:50 AM Confirm and document reason for call. If symptomatic, describe symptoms. ---Caller states on Thursday L ear was swollen and infected. Now R ear swollen and infected. R ear has been draining since last night. Drainage is  clear-yellowish. She has ear drops Ofloxacin 0.3% 14ml due to frequent ear infections. Drops are not helping. Rates pain 10/10. Can hardly hear out of her L ear and R ear is muffled. Sore throat since Monday. Does the patient have any new or worsening symptoms? ---Yes Will a triage be completed? ---Yes Related visit to physician within the last 2 weeks? ---No Does the PT have any chronic conditions? (i.e. diabetes, asthma, this includes High risk factors for pregnancy, etc.) ---No Is the patient pregnant or possibly pregnant? (Ask all females between the ages of 3-55) ---No Is this a behavioral health or substance abuse call? ---No Guidelines Guideline Title Affirmed Question Affirmed Notes Nurse Date/Time (Eastern Time) Earache [1] Stiff neck (unable to touch chin to chest) AND [2] fever Nicki Reaper, RN, Malachy Mood 11/06/2017 8:38:09 AM Disp. Time Eilene Ghazi Time) Disposition Final User 11/06/2017 8:41:02 AM Go to ED Now Yes Nicki Reaper, RN, Malachy Mood PLEASE NOTE: All timestamps contained within this report are represented as [place] Standard Time. CONFIDENTIALTY NOTICE: This fax transmission is intended only for the addressee. It contains information that is legally privileged, confidential or otherwise protected from use or disclosure. If you are not the intended recipient, you are strictly prohibited from reviewing, disclosing, copying using or disseminating any of this information or taking any action in reliance on or regarding this information. If you have received this fax in error, please notify us immediately by telephone so that we can arrange for its return to [place]. Phone: [phone], Toll-Free: [phone], Fax: [phone] Page: 2 of 2 Call Id: [id number] [place] Disagree/Comply Comply Caller Understands Yes PreDisposition Go to Urgent Care/Walk-In Clinic Care Advice Given Per Guideline GO TO ED NOW: * You need to be seen in the Emergency Department. * Go to the ED at  ___________ Hospital.  * Leave now. Drive carefully. DRIVING: Another adult should drive. BRING MEDICINES: * Please bring a list of your current medicines when you go to the Emergency Department (ER). * It is also a good idea to bring the pill bottles too. This will help the doctor to make certain you are taking the right medicines and the right dose. CARE ADVICE given per Earache (Adult) guideline. Referrals [facility] - ED [facility] - [facility]

## 2017-11-07 NOTE — Telephone Encounter (Signed)
Encourage her to start the bactrim Thanks  [place] she feels better soon

## 2017-11-09 ENCOUNTER — Inpatient Hospital Stay: Payer: Managed Care, Other (non HMO) | Admitting: Medical

## 2018-08-21 ENCOUNTER — Encounter: Payer: Self-pay | Admitting: Family Medicine

## 2018-08-21 DIAGNOSIS — E538 Deficiency of other specified B group vitamins: Secondary | ICD-10-CM

## 2018-08-21 DIAGNOSIS — Z0000 Encounter for general adult medical examination without abnormal findings: Secondary | ICD-10-CM

## 2018-08-21 DIAGNOSIS — E559 Vitamin D deficiency, unspecified: Secondary | ICD-10-CM

## 2018-08-22 DIAGNOSIS — E559 Vitamin D deficiency, unspecified: Secondary | ICD-10-CM | POA: Insufficient documentation

## 2018-08-23 ENCOUNTER — Telehealth: Payer: Self-pay | Admitting: *Deleted

## 2018-08-23 ENCOUNTER — Telehealth: Payer: Self-pay | Admitting: Family Medicine

## 2018-08-23 ENCOUNTER — Telehealth: Payer: BLUE CROSS/BLUE SHIELD | Admitting: Nurse Practitioner

## 2018-08-23 DIAGNOSIS — W57XXXA Bitten or stung by nonvenomous insect and other nonvenomous arthropods, initial encounter: Secondary | ICD-10-CM | POA: Diagnosis not present

## 2018-08-23 DIAGNOSIS — S50361A Insect bite (nonvenomous) of right elbow, initial encounter: Secondary | ICD-10-CM

## 2018-08-23 MED ORDER — SULFAMETHOXAZOLE-TRIMETHOPRIM 800-160 MG PO TABS: 1.0000 | ORAL_TABLET | Freq: Two times a day (BID) | ORAL | 0 refills | Status: DC

## 2018-08-23 MED ORDER — AZITHROMYCIN 250 MG PO TABS: ORAL_TABLET | ORAL | 0 refills | Status: DC

## 2018-08-23 NOTE — Telephone Encounter (Signed)
Pt said she's never had MRSA before. Allergy list updated. Pt also wanted me to ask if Dr. Glori Bickers could prescribe something for the pain. Pt said it is really painful so she didn't know what she could use or what you could prescribed. Pt's tried OTC pain med and benadryl cram with no relief

## 2018-08-23 NOTE — Telephone Encounter (Signed)
I sent zpak (she has multiple drug allergies)  I recommend aleve for pain 1-2 pills with food bid   Elevate Warm compress if needed F/u if not improving

## 2018-08-23 NOTE — Telephone Encounter (Signed)
Pt called back and wants to make sure code is sent in today so pt can pick up rx. Pt notified that rx was sent electronically to [place] with coding. Pt appreciative.

## 2018-08-23 NOTE — Telephone Encounter (Signed)
Re sent with code for cellulitis

## 2018-08-23 NOTE — Telephone Encounter (Signed)
Patient called stating that she did an e-visit this morning and was diagnosed with cellulitis. Patient stated that she was bitten by something last night and woke up this morning with a red, swollen, hot area on her right arm from elbow to underneath her arm. Patient stated that her temperature is 97.4.  Patient was prescribed sulfa and she is allergic to sulfa. Patient stated that she put on her form for e-visit that she is allergic to sulfa.  Patient stated that she has e-mailed the NP back about sulfa, but has not heard anything back from her. Patient stated that she can hardly lift her arm. Pharmacy [place]

## 2018-08-23 NOTE — Telephone Encounter (Signed)
Pt notified of Dr. Tower's instructions and verbalized understanding  

## 2018-08-23 NOTE — Telephone Encounter (Signed)
Patient called and stated that the pharmacy is needing a ICD9 code in order for them to fill the zpak for the patient.

## 2018-08-23 NOTE — Telephone Encounter (Signed)
Please ask her if she has ever had MRSA (methicillin resistant) infection in the past  (I do not think she has)  She has several abx allergies Please add sulfa to her list and ask her what reaction she had I will send something else in once I have the answer to the mrsa question  thanks

## 2018-08-23 NOTE — Progress Notes (Signed)
E Visit for Cellulitis  We are sorry that you are not feeling well. Here is how we plan to help!  Based on what you shared with me it looks like you have cellulitis from insect bite.  Cellulitis looks like areas of skin redness, swelling, and warmth; it develops as a result of bacteria entering under the skin. Little red spots and/or bleeding can be seen in skin, and tiny surface sacs containing fluid can occur. Fever can be present. Cellulitis is almost always on one side of a body, and the lower limbs are the most common site of involvement.   I have prescribed:  Bactrim DS 1 tablet by mouth BID for 7 days  HOME CARE:  . Take your medications as ordered and take all of them, even if the skin irritation appears to be healing.   GET HELP RIGHT AWAY IF:  . Symptoms that don't begin to go away within 48 hours. . Severe redness persists or worsens . If the area turns color, spreads or swells. . If it blisters and opens, develops yellow-brown crust or bleeds. . You develop a fever or chills. . If the pain increases or becomes unbearable.  . Are unable to keep fluids and food down.  MAKE SURE YOU    Understand these instructions.  Will watch your condition.  Will get help right away if you are not doing well or get worse.  Thank you for choosing an e-visit. Your e-visit answers were reviewed by a board certified advanced clinical practitioner to complete your personal care plan. Depending upon the condition, your plan could have included both over the counter or prescription medications. Please review your pharmacy choice. Make sure the pharmacy is open so you can pick up prescription now. If there is a problem, you may contact your provider through [employer] and have the prescription routed to another pharmacy. Your safety is important to [place]. If you have drug allergies check your prescription carefully.  For the next 24 hours you can use MyChart to ask questions about today's  visit, request a non-urgent call back, or ask for a work or school excuse. You will get an email in the next two days asking about your experience. I hope that your e-visit has been valuable and will speed your recovery.  5-10 minutes spent reviewing and documenting in chart.

## 2018-08-23 NOTE — Telephone Encounter (Signed)
Pt called back and wants to make sure code is sent in today so pt can pick up rx. Pt notified that rx was sent electronically to [facility] with coding. Pt appreciative.

## 2018-08-25 MED ORDER — CEPHALEXIN 500 MG PO CAPS: 500.0000 mg | ORAL_CAPSULE | Freq: Three times a day (TID) | ORAL | 0 refills | Status: DC

## 2018-08-25 NOTE — Addendum Note (Signed)
Addended by: Chevis Pretty on: 08/25/2018 08:18 PM   Modules accepted: Orders

## 2018-10-13 ENCOUNTER — Other Ambulatory Visit: Payer: Self-pay

## 2018-10-13 ENCOUNTER — Other Ambulatory Visit (INDEPENDENT_AMBULATORY_CARE_PROVIDER_SITE_OTHER): Payer: BC Managed Care – PPO

## 2018-10-13 DIAGNOSIS — E538 Deficiency of other specified B group vitamins: Secondary | ICD-10-CM

## 2018-10-13 DIAGNOSIS — E559 Vitamin D deficiency, unspecified: Secondary | ICD-10-CM | POA: Diagnosis not present

## 2018-10-13 DIAGNOSIS — Z0000 Encounter for general adult medical examination without abnormal findings: Secondary | ICD-10-CM

## 2018-10-13 LAB — COMPREHENSIVE METABOLIC PANEL
ALT: 10 U/L (ref 0–35)
AST: 13 U/L (ref 0–37)
Albumin: 4.2 g/dL (ref 3.5–5.2)
Alkaline Phosphatase: 38 U/L — ABNORMAL LOW (ref 39–117)
BUN: 8 mg/dL (ref 6–23)
CO2: 27 mEq/L (ref 19–32)
Calcium: 9.4 mg/dL (ref 8.4–10.5)
Chloride: 103 mEq/L (ref 96–112)
Creatinine, Ser: 0.65 mg/dL (ref 0.40–1.20)
GFR: 100.64 mL/min (ref >60.00)
Glucose, Bld: 88 mg/dL (ref 70–99)
Potassium: 4 mEq/L (ref 3.5–5.1)
Sodium: 138 mEq/L (ref 135–145)
Total Bilirubin: 0.8 mg/dL (ref 0.2–1.2)
Total Protein: 6.6 g/dL (ref 6.0–8.3)

## 2018-10-13 LAB — CBC WITH DIFFERENTIAL/PLATELET
Basophils Absolute: 0 10*3/uL (ref 0.0–0.1)
Basophils Relative: 1.1 % (ref 0.0–3.0)
Eosinophils Absolute: 0.1 10*3/uL (ref 0.0–0.7)
Eosinophils Relative: 2.5 % (ref 0.0–5.0)
HCT: 37.2 % (ref 36.0–46.0)
Hemoglobin: 12.4 g/dL (ref 12.0–15.0)
Lymphocytes Relative: 34.3 % (ref 12.0–46.0)
Lymphs Abs: 1.5 10*3/uL (ref 0.7–4.0)
MCHC: 33.2 g/dL (ref 30.0–36.0)
MCV: 91.8 fl (ref 78.0–100.0)
Monocytes Absolute: 0.5 10*3/uL (ref 0.1–1.0)
Monocytes Relative: 11.2 % (ref 3.0–12.0)
Neutro Abs: 2.2 10*3/uL (ref 1.4–7.7)
Neutrophils Relative %: 50.9 % (ref 43.0–77.0)
Platelets: 205 10*3/uL (ref 150.0–400.0)
RBC: 4.05 Mil/uL (ref 3.87–5.11)
RDW: 13.3 % (ref 11.5–15.5)
WBC: 4.3 10*3/uL (ref 4.0–10.5)

## 2018-10-13 LAB — VITAMIN B12: Vitamin B-12: 227 pg/mL (ref 211–911)

## 2018-10-13 LAB — LIPID PANEL
Cholesterol: 166 mg/dL (ref 0–200)
HDL: 79.1 mg/dL (ref >39.00)
LDL Cholesterol: 74 mg/dL (ref 0–99)
NonHDL: 86.46
Total CHOL/HDL Ratio: 2
Triglycerides: 61 mg/dL (ref 0.0–149.0)
VLDL: 12.2 mg/dL (ref 0.0–40.0)

## 2018-10-13 LAB — VITAMIN D 25 HYDROXY (VIT D DEFICIENCY, FRACTURES): VITD: 48.53 ng/mL (ref 30.00–100.00)

## 2018-10-13 LAB — TSH: TSH: 3.58 u[IU]/mL (ref 0.35–4.50)

## 2018-10-18 ENCOUNTER — Other Ambulatory Visit: Payer: Self-pay

## 2018-10-18 ENCOUNTER — Ambulatory Visit (INDEPENDENT_AMBULATORY_CARE_PROVIDER_SITE_OTHER): Payer: BC Managed Care – PPO | Admitting: Family Medicine

## 2018-10-18 ENCOUNTER — Encounter: Payer: Self-pay | Admitting: Family Medicine

## 2018-10-18 VITALS — BP 106/64 | HR 70 | Temp 98.1°F | Ht 63.0 in | Wt 151.4 lb

## 2018-10-18 DIAGNOSIS — E538 Deficiency of other specified B group vitamins: Secondary | ICD-10-CM

## 2018-10-18 DIAGNOSIS — Z1231 Encounter for screening mammogram for malignant neoplasm of breast: Secondary | ICD-10-CM | POA: Diagnosis not present

## 2018-10-18 DIAGNOSIS — Z0000 Encounter for general adult medical examination without abnormal findings: Secondary | ICD-10-CM

## 2018-10-18 DIAGNOSIS — E559 Vitamin D deficiency, unspecified: Secondary | ICD-10-CM | POA: Diagnosis not present

## 2018-10-18 DIAGNOSIS — N809 Endometriosis, unspecified: Secondary | ICD-10-CM

## 2018-10-18 DIAGNOSIS — E663 Overweight: Secondary | ICD-10-CM

## 2018-10-18 NOTE — Assessment & Plan Note (Signed)
Lab Results  Component Value Date   [id number] 10/13/2018   Pt had slowed down her dosing Will resume at 1000 mcg daily

## 2018-10-18 NOTE — Assessment & Plan Note (Signed)
Commended on great job with wt loss so far

## 2018-10-18 NOTE — Assessment & Plan Note (Signed)
Referred for first screening mammogram (38 yo) MGM had breast cancer Nl exam today Enc regular self exams

## 2018-10-18 NOTE — Assessment & Plan Note (Signed)
Vitamin D level is therapeutic with current supplementation Disc importance of this to bone and overall health Level of 48.5 Enc to continue current dose

## 2018-10-18 NOTE — Progress Notes (Signed)
Subjective:    Patient ID: [name], female    DOB: 20-Jun-1980, 39 y.o.   MRN: [id number]  HPI Here for health maintenance exam and to review chronic medical problems    Is working at home  No covid exposures   Weight  Wt Readings from Last 3 Encounters:  10/18/18 151 lb 7 oz (68.7 kg)  02/02/17 166 lb (75.3 kg)  01/12/17 169 lb 12.8 oz (77 kg)  she exercises at least 5 days per week  Went to the gym before covid  Mainly vegetarian except for fish  Has a garden  26.83 kg/m   Flu vaccine -she declines for now/got sick from one in the past  Tdap 4/12   HIV/std screening -not needed/low risk , monogamous   Pap 1/19 negative with no HPV Has endometriosis   She went to gyn in the spring  Had some ovarian cysts and ultrasound to monitor  Some months are better than others Magnesium helps during menses  Bad menstrual cramps persist - pretty regular and heavy  Tried another OC- gave her migraines so she had to stop  May consider surgery in the future    Mood/history of anxiety  Mood is good   Breast cancer screening -needs mammogram  No lumps on self exam  Gets some breast pain before menses  MGM had breast cancer   B12 deficiency  Lab Results  Component Value Date   VITAMINB12 227 10/13/2018  she backed off her B12 recently (2500 mcg)    Vit D def  Level 48.5   BP Readings from Last 3 Encounters:  10/18/18 106/64  02/02/17 120/74  01/12/17 129/77   Pulse Readings from Last 3 Encounters:  10/18/18 70  02/02/17 67  01/12/17 80    Cholesterol Lab Results  Component Value Date   CHOL 166 10/13/2018   CHOL 162 01/26/2017   CHOL 174 04/17/2012   Lab Results  Component Value Date   HDL 79.10 10/13/2018   HDL 71.30 01/26/2017   HDL 63.70 04/17/2012   Lab Results  Component Value Date   LDLCALC 74 10/13/2018   LDLCALC 73 01/26/2017   LDLCALC 94 04/17/2012   Lab Results  Component Value Date   TRIG 61.0 10/13/2018   TRIG 87.0  01/26/2017   TRIG 82.0 04/17/2012   Lab Results  Component Value Date   CHOLHDL 2 10/13/2018   CHOLHDL 2 01/26/2017   CHOLHDL 3 04/17/2012   No results found for: LDLDIRECT Very good   Lab Results  Component Value Date   CREATININE 0.65 10/13/2018   BUN 8 10/13/2018   NA 138 10/13/2018   K 4.0 10/13/2018   CL 103 10/13/2018   CO2 27 10/13/2018   Lab Results  Component Value Date   ALT 10 10/13/2018   AST 13 10/13/2018   ALKPHOS 38 (L) 10/13/2018   BILITOT 0.8 10/13/2018    Lab Results  Component Value Date   WBC 4.3 10/13/2018   HGB 12.4 10/13/2018   HCT 37.2 10/13/2018   MCV 91.8 10/13/2018   PLT 205.0 10/13/2018    Lab Results  Component Value Date   TSH 3.58 10/13/2018    Patient Active Problem List   Diagnosis Date Noted  . Screening mammogram, encounter for 10/18/2018  . Vitamin D deficiency 08/22/2018  . Encounter for routine gynecological examination 02/02/2017  . Paresthesia of arm 10/13/2016  . Eczema 01/29/2015  . Health maintenance examination 05/16/2014  . Paresthesias 05/16/2014  .  Anxiety disorder 03/19/2013  . Pelvic pain in female 01/09/2013  . Heavy menses 01/03/2013  . ADD (attention deficit disorder) 02/29/2012  . Fibromyalgia 03/26/2011  . Myofascial pain 03/19/2011  . Gluten intolerance 01/22/2011  . Overweight (BMI 25.0-29.9) 11/09/2010  . FIBROCYSTIC BREAST DISEASE 03/27/2010  . CAVERNOUS HEMANGIOMA, LIVER 12/03/2009  . PERSONAL HISTORY OF COLONIC POLYPS 12/03/2009  . ALLERGIC RHINITIS 03/09/2008  . URINARY FREQUENCY, CHRONIC 01/31/2007  . B12 deficiency 10/28/2006  . GERD 04/27/2006  . Endometriosis 04/27/2006  . RENAL CALCULUS, HX OF 04/27/2006  . MIGRAINES, HX OF 04/27/2006   Past Medical History:  Diagnosis Date  . ADD (attention deficit disorder)   . Endometriosis   . Family history of malignant neoplasm of gastrointestinal tract   . Fatigue   . Fibromyalgia   . GERD (gastroesophageal reflux disease)   . Kidney  stone   . Migraine   . Personal history of colonic polyps 06/01/2006   sessil serrated adenoma  . Vitamin B12 deficiency    Past Surgical History:  Procedure Laterality Date  . BREAST SURGERY  2004   breast reduction  . CHOLECYSTECTOMY    . LAPAROSCOPY  11/2004   endometriosis  . OVARIAN CYST SURGERY    . TUBAL LIGATION  2008  . WISDOM TOOTH EXTRACTION     with four other teeth as well   Social History   Tobacco Use  . Smoking status: Former Smoker    Packs/day: 0.25    Quit date: 01/25/2005    Years since quitting: 13.7  . Smokeless tobacco: Never Used  Substance Use Topics  . Alcohol use: Yes    Comment: rare  . Drug use: No   Family History  Problem Relation Age of Onset  . Cervical cancer Mother 73  . Hypertension Mother   . Diabetes Mother   . Kidney cancer Mother 24  . Cancer Mother        Melanoma  . Kidney failure Brother 5       s/p transplant ?due to PCKD or renal stones  . Wilson's disease Father        liver transplant  . Ulcerative colitis Father   . Liver disease Father   . Colon polyps Sister   . Breast cancer Maternal Aunt   . Breast cancer Maternal Grandmother   . Colon cancer Paternal Grandfather   . Esophageal cancer Neg Hx   . Stomach cancer Neg Hx   . Rectal cancer Neg Hx    Allergies  Allergen Reactions  . Strawberry Extract Anaphylaxis and Hives  . Clindamycin     REACTION: hives, angioedema  . Doxycycline     axillary rash   . Gabapentin     Dizzy/ worse headache  . Penicillins     REACTION: hives, angioedema  . Sulfa Antibiotics    Current Outpatient Medications on File Prior to Visit  Medication Sig Dispense Refill  . NEOMYCIN-POLYMYXIN-HYDROCORTISONE (CORTISPORIN) 1 % SOLN OTIC solution Place 3 drops into both ears every 6 (six) hours. 10 mL 0  . clotrimazole-betamethasone (LOTRISONE) cream Apply 1 application topically 2 (two) times daily. 30 g 0   No current facility-administered medications on file prior to visit.      Review of Systems  Constitutional: Negative for activity change, appetite change, fatigue, fever and unexpected weight change.  HENT: Negative for congestion, ear pain, rhinorrhea, sinus pressure and sore throat.   Eyes: Negative for pain, redness and visual disturbance.  Respiratory: Negative for cough,  shortness of breath and wheezing.   Cardiovascular: Negative for chest pain and palpitations.  Gastrointestinal: Negative for abdominal pain, blood in stool, constipation and diarrhea.  Endocrine: Negative for polydipsia and polyuria.  Genitourinary: Positive for menstrual problem. Negative for dysuria, frequency and urgency.       Endometriosis with painful menses  Musculoskeletal: Negative for arthralgias, back pain and myalgias.  Skin: Negative for pallor and rash.       Eczema in ear canals  Not bad today  Allergic/Immunologic: Negative for environmental allergies.  Neurological: Negative for dizziness, syncope and headaches.  Hematological: Negative for adenopathy. Does not bruise/bleed easily.  Psychiatric/Behavioral: Negative for decreased concentration and dysphoric mood. The patient is not nervous/anxious.        Objective:   Physical Exam Constitutional:      General: She is not in acute distress.    Appearance: Normal appearance. She is well-developed and normal weight. She is not ill-appearing or diaphoretic.  HENT:     Head: Normocephalic and atraumatic.     Right Ear: Tympanic membrane, ear canal and external ear normal.     Left Ear: Tympanic membrane, ear canal and external ear normal.     Nose: Nose normal. No congestion.     Mouth/Throat:     Mouth: Mucous membranes are moist.     Pharynx: Oropharynx is clear. No posterior oropharyngeal erythema.  Eyes:     General: No scleral icterus.    Extraocular Movements: Extraocular movements intact.     Conjunctiva/sclera: Conjunctivae normal.     Pupils: Pupils are equal, round, and reactive to light.  Neck:      Musculoskeletal: Normal range of motion and neck supple. No neck rigidity or muscular tenderness.     Thyroid: No thyromegaly.     Vascular: No carotid bruit or JVD.  Cardiovascular:     Rate and Rhythm: Normal rate and regular rhythm.     Pulses: Normal pulses.     Heart sounds: Normal heart sounds. No gallop.   Pulmonary:     Effort: Pulmonary effort is normal. No respiratory distress.     Breath sounds: Normal breath sounds. No wheezing.     Comments: Good air exch Chest:     Chest wall: No tenderness.  Abdominal:     General: Bowel sounds are normal. There is no distension or abdominal bruit.     Palpations: Abdomen is soft. There is no mass.     Tenderness: There is no abdominal tenderness.     Hernia: No hernia is present.  Genitourinary:    Comments: Breast exam: No mass, nodules, thickening, tenderness, bulging, retraction, inflamation, nipple discharge or skin changes noted.  No axillary or clavicular LA.     Musculoskeletal: Normal range of motion.        General: No tenderness or deformity.     Right lower leg: No edema.     Left lower leg: No edema.  Lymphadenopathy:     Cervical: No cervical adenopathy.  Skin:    General: Skin is warm and dry.     Coloration: Skin is not pale.     Findings: No erythema or rash.     Comments: Solar lentigines diffusely No eczema today  Neurological:     Mental Status: She is alert. Mental status is at baseline.     Cranial Nerves: No cranial nerve deficit.     Motor: No abnormal muscle tone.     Coordination: Coordination normal.  Gait: Gait normal.     Deep Tendon Reflexes: Reflexes are normal and symmetric. Reflexes normal.  Psychiatric:        Mood and Affect: Mood normal.     Comments: Pleasant and talkative           Assessment & Plan:   Problem List Items Addressed This Visit      Other   B12 deficiency    Lab Results  Component Value Date   [id number] 10/13/2018   Pt had slowed down her dosing Will  resume at 1000 mcg daily       Endometriosis    Pt had f/u with gyn this spring- intolerant of all hormonal tx so far  Does not desire hysterectomy at this time Has pain with menses      Overweight (BMI 25.0-29.9)    Commended on great job with wt loss so far      Health maintenance examination - Primary    Reviewed health habits including diet and exercise and skin cancer prevention Reviewed appropriate screening tests for age  Also reviewed health mt list, fam hx and immunization status , as well as social and family history   See HPI Labs reviewed Enc to start back on vit B12 D level is good Commended good diet and exercise and wt loss  Pt declines flu shot unfortunately      Vitamin D deficiency    Vitamin D level is therapeutic with current supplementation Disc importance of this to bone and overall health Level of 48.5 Enc to continue current dose      Screening mammogram, encounter for    Referred for first screening mammogram (38 yo) MGM had breast cancer Nl exam today Enc regular self exams       Relevant Orders   MM 3D SCREEN BREAST BILATERAL

## 2018-10-18 NOTE — Assessment & Plan Note (Signed)
Reviewed health habits including diet and exercise and skin cancer prevention Reviewed appropriate screening tests for age  Also reviewed health mt list, fam hx and immunization status , as well as social and family history   See HPI Labs reviewed Enc to start back on vit B12 D level is good Commended good diet and exercise and wt loss  Pt declines flu shot unfortunately

## 2018-10-18 NOTE — Assessment & Plan Note (Signed)
Pt had f/u with gyn this spring- intolerant of all hormonal tx so far  Does not desire hysterectomy at this time Has pain with menses

## 2018-10-18 NOTE — Patient Instructions (Addendum)
Get back on some B12- try 1000 mcg daily   Stop up front for mammogram appointment   Magnesium is fine 250 to 500 mg daily   Drink lots of fluids Keep exercising and eating healthy  Labs look good

## 2018-11-02 ENCOUNTER — Telehealth: Payer: Self-pay | Admitting: Family Medicine

## 2018-11-02 ENCOUNTER — Encounter: Payer: Self-pay | Admitting: Family Medicine

## 2018-11-02 NOTE — Telephone Encounter (Signed)
LMOM.  For screening mammograms, pts call the facility directly.  Advised her screening mammogram order is for [facility] and their phone# is [phone].

## 2018-11-03 ENCOUNTER — Encounter: Payer: Self-pay | Admitting: Physician Assistant

## 2018-11-03 ENCOUNTER — Telehealth: Payer: BC Managed Care – PPO | Admitting: Emergency Medicine

## 2018-11-03 DIAGNOSIS — J0140 Acute pansinusitis, unspecified: Secondary | ICD-10-CM | POA: Diagnosis not present

## 2018-11-03 MED ORDER — AZITHROMYCIN 250 MG PO TABS: 250.0000 mg | ORAL_TABLET | Freq: Every day | ORAL | 0 refills | Status: DC

## 2018-11-03 MED ORDER — METHYLPREDNISOLONE 4 MG PO TBPK: ORAL_TABLET | ORAL | 0 refills | Status: DC

## 2018-11-03 NOTE — Progress Notes (Signed)
We are sorry that you are not feeling well.  Here is how we plan to help!  Based on what you have shared with me it looks like you have sinusitis.  Sinusitis is inflammation and infection in the sinus cavities of the head.  Based on your presentation I believe you most likely have Acute Bacterial Sinusitis.  This is an infection caused by bacteria and is treated with antibiotics. I have prescribed Azithromycin and prednisone You may use an oral decongestant such as Mucinex D or if you have glaucoma or high blood pressure use plain Mucinex. Saline nasal spray help and can safely be used as often as needed for congestion.  If you develop worsening sinus pain, fever or notice severe headache and vision changes, or if symptoms are not better after completion of antibiotic, please schedule an appointment with a health care provider.    Sinus infections are not as easily transmitted as other respiratory infection, however we still recommend that you avoid close contact with loved ones, especially the very young and elderly.  Remember to wash your hands thoroughly throughout the day as this is the number one way to prevent the spread of infection!  Home Care:  Only take medications as instructed by your medical team.  Complete the entire course of an antibiotic.  Do not take these medications with alcohol.  A steam or ultrasonic humidifier can help congestion.  You can place a towel over your head and breathe in the steam from hot water coming from a faucet.  Avoid close contacts especially the very young and the elderly.  Cover your mouth when you cough or sneeze.  Always remember to wash your hands.  Get Help Right Away If:  You develop worsening fever or sinus pain.  You develop a severe head ache or visual changes.  Your symptoms persist after you have completed your treatment plan.  Make sure you  Understand these instructions.  Will watch your condition.  Will get help right away  if you are not doing well or get worse.  Your e-visit answers were reviewed by a board certified advanced clinical practitioner to complete your personal care plan.  Depending on the condition, your plan could have included both over the counter or prescription medications.  If there is a problem please reply  once you have received a response from your provider.  Your safety is important to [place].  If you have drug allergies check your prescription carefully.    You can use MyChart to ask questions about today's visit, request a non-urgent call back, or ask for a work or school excuse for 24 hours related to this e-Visit. If it has been greater than 24 hours you will need to follow up with your provider, or enter a new e-Visit to address those concerns.  You will get an e-mail in the next two days asking about your experience.  I hope that your e-visit has been valuable and will speed your recovery. Thank you for using e-visits.   Greater than 5 but less than 10 minutes spent researching, coordinating, and implementing care for this patient today

## 2018-11-03 NOTE — Progress Notes (Signed)
E-Visit for Corona Virus Screening   Your current symptoms could be consistent with the coronavirus.  Many health care providers can now test patients at their office but not all are.  [facility] has multiple testing sites. For information on our COVID testing locations and hours go to [email or web address]  Please quarantine yourself while awaiting your test results.  We are enrolling you in our [place] for [place] . Daily you will receive a questionnaire within the [facility] website. Our COVID 19 response team willl be monitoriing your responses daily.  COVID-19 is a respiratory illness with symptoms that are similar to the flu. Symptoms are typically mild to moderate, but there have been cases of severe illness and death due to the virus. The following symptoms may appear 2-14 days after exposure: . Fever . Cough . Shortness of breath or difficulty breathing . Chills . Repeated shaking with chills . Muscle pain . Headache . Sore throat . New loss of taste or smell . Fatigue . Congestion or runny nose . Nausea or vomiting . Diarrhea  It is vitally important that if you feel that you have an infection such as this virus or any other virus that you stay home and away from places where you may spread it to others.  You should self-quarantine for 14 days if you have symptoms that could potentially be coronavirus or have been in close contact a with a person diagnosed with COVID-19 within the last 2 weeks. You should avoid contact with people age 40 and older.   You should wear a mask or cloth face covering over your nose and mouth if you must be around other people or animals, including pets (even at home). Try to stay at least 6 feet away from other people. This will protect the people around you.  You may also take acetaminophen (Tylenol) as needed for fever.  I have attached a work note for you    Reduce your risk of any infection by using the  same precautions used for avoiding the common cold or flu:  Marland Kitchen Wash your hands often with soap and warm water for at least 20 seconds.  If soap and water are not readily available, use an alcohol-based hand sanitizer with at least 60% alcohol.  . If coughing or sneezing, cover your mouth and nose by coughing or sneezing into the elbow areas of your shirt or coat, into a tissue or into your sleeve (not your hands). . Avoid shaking hands with others and consider head nods or verbal greetings only. . Avoid touching your eyes, nose, or mouth with unwashed hands.  . Avoid close contact with people who are sick. . Avoid places or events with large numbers of people in one location, like concerts or sporting events. . Carefully consider travel plans you have or are making. . If you are planning any travel outside or inside the [place], visit the CDC's Travelers' Health webpage for the latest health notices. . If you have some symptoms but not all symptoms, continue to monitor at home and seek medical attention if your symptoms worsen. . If you are having a medical emergency, call 911.  HOME CARE . Only take medications as instructed by your medical team. . Drink plenty of fluids and get plenty of rest. . A steam or ultrasonic humidifier can help if you have congestion.   GET HELP RIGHT AWAY IF YOU HAVE EMERGENCY WARNING SIGNS** FOR COVID-19. If you or someone is showing any of  these signs seek emergency medical care immediately. Call 911 or proceed to your closest emergency facility if: . You develop worsening high fever. . Trouble breathing . Bluish lips or face . Persistent pain or pressure in the chest . New confusion . Inability to wake or stay awake . You cough up blood. . Your symptoms become more severe  **This list is not all possible symptoms. Contact your medical provider for any symptoms that are sever or concerning to you.   MAKE SURE YOU   Understand these instructions.  Will watch  your condition.  Will get help right away if you are not doing well or get worse.  Your e-visit answers were reviewed by a board certified advanced clinical practitioner to complete your personal care plan.  Depending on the condition, your plan could have included both over the counter or prescription medications.  If there is a problem please reply once you have received a response from your provider.  Your safety is important to [place].  If you have drug allergies check your prescription carefully.    You can use MyChart to ask questions about today's visit, request a non-urgent call back, or ask for a work or school excuse for 24 hours related to this e-Visit. If it has been greater than 24 hours you will need to follow up with your provider, or enter a new e-Visit to address those concerns. You will get an e-mail in the next two days asking about your experience.  I hope that your e-visit has been valuable and will speed your recovery. Thank you for using e-visits.

## 2018-11-06 ENCOUNTER — Telehealth: Payer: Self-pay | Admitting: Family Medicine

## 2018-11-06 ENCOUNTER — Other Ambulatory Visit: Payer: Self-pay

## 2018-11-06 DIAGNOSIS — Z20822 Contact with and (suspected) exposure to covid-19: Secondary | ICD-10-CM

## 2018-11-06 DIAGNOSIS — Z20828 Contact with and (suspected) exposure to other viral communicable diseases: Secondary | ICD-10-CM | POA: Diagnosis not present

## 2018-11-06 NOTE — Telephone Encounter (Signed)
Patient stated that over the weekend she had an e-visit and they advised her to go be tested., She called today to get testing information and sites. She will be going today to test.  Advised her to quarantine and pending results she will be contacted on what to do next

## 2018-11-06 NOTE — Telephone Encounter (Signed)
Aware, I will watch for results

## 2018-11-07 LAB — NOVEL CORONAVIRUS, NAA: SARS-CoV-2, NAA: NOT DETECTED

## 2018-11-14 DIAGNOSIS — Z6826 Body mass index (BMI) 26.0-26.9, adult: Secondary | ICD-10-CM | POA: Diagnosis not present

## 2018-11-14 DIAGNOSIS — Z20828 Contact with and (suspected) exposure to other viral communicable diseases: Secondary | ICD-10-CM | POA: Diagnosis not present

## 2018-12-07 ENCOUNTER — Ambulatory Visit (HOSPITAL_BASED_OUTPATIENT_CLINIC_OR_DEPARTMENT_OTHER)
Admission: RE | Admit: 2018-12-07 | Discharge: 2018-12-07 | Disposition: A | Discharge status: Home / Self Care | Payer: BC Managed Care – PPO | Source: Ambulatory Visit | Attending: Family Medicine | Admitting: Family Medicine

## 2018-12-07 ENCOUNTER — Encounter: Payer: Self-pay | Admitting: Family Medicine

## 2018-12-07 ENCOUNTER — Ambulatory Visit (INDEPENDENT_AMBULATORY_CARE_PROVIDER_SITE_OTHER): Payer: BC Managed Care – PPO | Admitting: Family Medicine

## 2018-12-07 ENCOUNTER — Other Ambulatory Visit: Payer: Self-pay

## 2018-12-07 VITALS — BP 147/63 | HR 63 | Ht 63.0 in | Wt 154.0 lb

## 2018-12-07 DIAGNOSIS — Z113 Encounter for screening for infections with a predominantly sexual mode of transmission: Secondary | ICD-10-CM

## 2018-12-07 DIAGNOSIS — N939 Abnormal uterine and vaginal bleeding, unspecified: Secondary | ICD-10-CM

## 2018-12-07 DIAGNOSIS — R102 Pelvic and perineal pain: Secondary | ICD-10-CM

## 2018-12-07 DIAGNOSIS — N809 Endometriosis, unspecified: Secondary | ICD-10-CM

## 2018-12-07 DIAGNOSIS — N83202 Unspecified ovarian cyst, left side: Secondary | ICD-10-CM | POA: Diagnosis not present

## 2018-12-07 DIAGNOSIS — N898 Other specified noninflammatory disorders of vagina: Secondary | ICD-10-CM | POA: Diagnosis not present

## 2018-12-07 NOTE — Progress Notes (Signed)
Patient LMP 11-23-18  And patient states she has had bleeding that started two days ago. Patient states this is abnormal for her to have bleeding between periods. Patient having some abdominal pain. Kathrene Alu RN

## 2018-12-07 NOTE — Progress Notes (Signed)
   Subjective:    Patient ID: [name], female    DOB: 1980-08-10, 38 y.o.   MRN: [id number]  HPI Patient seen for pelvic pain. She was diagnosed with endometriosis years ago and had many spots removed during BTL. Also has history of cysts - documented in 2016 with resolution in f/u US. No fibroids. Started having left sided stabbing, nonradiating pelvic pain a few weeks ago. Pain intermittent, not improved with tylenol or ibuprofen. Some movements make pain worse. No fevers, chills, constipation, diarrhea. No new sexual partners.   H/o BTL, cholecystectomy. No c/s. H/o 2 SVD.   Review of Systems     Objective:   Physical Exam Vitals signs and nursing note reviewed. Exam conducted with a chaperone present.  Constitutional:      Appearance: Normal appearance.  Cardiovascular:     Rate and Rhythm: Normal rate.     Pulses: Normal pulses.  Pulmonary:     Effort: Pulmonary effort is normal.  Abdominal:     Tenderness: There is abdominal tenderness in the left lower quadrant.     Hernia: There is no hernia in the left inguinal area or right inguinal area.  Genitourinary:    Labia:        Right: No rash, tenderness or lesion.        Left: No rash, tenderness or lesion.      Cervix: No cervical motion tenderness, discharge, friability, lesion, erythema or cervical bleeding.     Uterus: Not deviated, not enlarged, not fixed, not tender and no uterine prolapse.      Adnexa:        Right: No mass, tenderness or fullness.         Left: Tenderness and fullness present.   Lymphadenopathy:     Lower Body: No right inguinal adenopathy. No left inguinal adenopathy.  Neurological:     Mental Status: She is alert.       Assessment & Plan:  1. Abnormal uterine bleeding (AUB) Discussed options of menorrhagia and dysmenorrhea: progesterone, OCPs, as well as surgical: ablation vs hysterectomy. Patient would prefer surgical method at this point - Cervicovaginal ancillary only( [facility])   2. Pelvic pain Uncertain of whether this is endometrial vs cyst or other pelvic pain. Does have some fullness and pain extends into lower abdomen. - [place] [facility] Pelvis Complete; Future - [place] [facility] Transvaginal; Future - Cervicovaginal ancillary only( [facility])  3. Endometriosis Discussed using [place]. Will hold off until [place].

## 2018-12-08 ENCOUNTER — Other Ambulatory Visit (HOSPITAL_BASED_OUTPATIENT_CLINIC_OR_DEPARTMENT_OTHER): Payer: Self-pay | Admitting: Family Medicine

## 2018-12-08 DIAGNOSIS — Z1231 Encounter for screening mammogram for malignant neoplasm of breast: Secondary | ICD-10-CM

## 2018-12-08 LAB — CERVICOVAGINAL ANCILLARY ONLY
Bacterial Vaginitis (gardnerella): NEGATIVE
Candida Glabrata: NEGATIVE
Candida Vaginitis: NEGATIVE
Chlamydia: NEGATIVE
Comment: NEGATIVE
Comment: NEGATIVE
Comment: NEGATIVE
Comment: NEGATIVE
Comment: NEGATIVE
Comment: NORMAL
Neisseria Gonorrhea: NEGATIVE
Trichomonas: NEGATIVE

## 2018-12-08 MED ORDER — DICLOFENAC POTASSIUM 50 MG PO TABS: 50.0000 mg | ORAL_TABLET | Freq: Three times a day (TID) | ORAL | 3 refills | Status: DC

## 2018-12-11 MED ORDER — IBUPROFEN 800 MG PO TABS: 800.0000 mg | ORAL_TABLET | Freq: Three times a day (TID) | ORAL | 3 refills | Status: DC | PRN

## 2018-12-11 NOTE — Addendum Note (Signed)
Addended by: Truett Mainland on: 12/11/2018 11:06 AM   Modules accepted: Orders

## 2018-12-12 ENCOUNTER — Ambulatory Visit (HOSPITAL_BASED_OUTPATIENT_CLINIC_OR_DEPARTMENT_OTHER)
Admission: RE | Admit: 2018-12-12 | Discharge: 2018-12-12 | Disposition: A | Discharge status: Home / Self Care | Payer: BC Managed Care – PPO | Source: Ambulatory Visit | Attending: Family Medicine | Admitting: Family Medicine

## 2018-12-12 ENCOUNTER — Other Ambulatory Visit: Payer: Self-pay

## 2018-12-12 DIAGNOSIS — Z1231 Encounter for screening mammogram for malignant neoplasm of breast: Secondary | ICD-10-CM

## 2018-12-13 ENCOUNTER — Other Ambulatory Visit: Payer: Self-pay | Admitting: Family Medicine

## 2018-12-13 DIAGNOSIS — R928 Other abnormal and inconclusive findings on diagnostic imaging of breast: Secondary | ICD-10-CM

## 2018-12-19 ENCOUNTER — Ambulatory Visit
Admission: RE | Admit: 2018-12-19 | Discharge: 2018-12-19 | Disposition: A | Discharge status: Home / Self Care | Payer: BC Managed Care – PPO | Source: Ambulatory Visit | Attending: Family Medicine | Admitting: Family Medicine

## 2018-12-19 ENCOUNTER — Ambulatory Visit: Admission: RE | Admit: 2018-12-19 | Payer: BC Managed Care – PPO | Source: Ambulatory Visit

## 2018-12-19 ENCOUNTER — Other Ambulatory Visit: Payer: Self-pay

## 2018-12-19 DIAGNOSIS — R928 Other abnormal and inconclusive findings on diagnostic imaging of breast: Secondary | ICD-10-CM

## 2019-01-01 ENCOUNTER — Ambulatory Visit (INDEPENDENT_AMBULATORY_CARE_PROVIDER_SITE_OTHER): Payer: BC Managed Care – PPO | Admitting: Obstetrics & Gynecology

## 2019-01-01 ENCOUNTER — Other Ambulatory Visit: Payer: Self-pay

## 2019-01-01 ENCOUNTER — Encounter: Payer: Self-pay | Admitting: Obstetrics & Gynecology

## 2019-01-01 VITALS — BP 129/77 | HR 67 | Ht 63.0 in | Wt 151.1 lb

## 2019-01-01 DIAGNOSIS — N939 Abnormal uterine and vaginal bleeding, unspecified: Secondary | ICD-10-CM | POA: Diagnosis not present

## 2019-01-01 DIAGNOSIS — R102 Pelvic and perineal pain: Secondary | ICD-10-CM

## 2019-01-01 DIAGNOSIS — N809 Endometriosis, unspecified: Secondary | ICD-10-CM

## 2019-01-01 MED ORDER — TRAMADOL HCL 50 MG PO TABS: 50.0000 mg | ORAL_TABLET | Freq: Four times a day (QID) | ORAL | 0 refills | Status: DC | PRN

## 2019-01-01 MED ORDER — ORILISSA 150 MG PO TABS: 1.0000 | ORAL_TABLET | Freq: Every day | ORAL | 3 refills | Status: DC

## 2019-01-01 NOTE — Progress Notes (Signed)
History:  38 y.o. [id number] here today for eval of pelvic pain and AUB. Pt was dx'd with endometriosis via laparoscopy. Pt reports that she has been on Lupron which caused worsening of her HA. She has a LnIUD prev which was removed, after 1 year, for worsening pain. She reports that in the last 3 months her pain has worsened and she has started having irreg menses. The pain is severe enough to keep her awake at night.   The following portions of the patient's history were reviewed and updated as appropriate: allergies, current medications, past family history, past medical history, past social history, past surgical history and problem list.  Review of Systems:  Pertinent items are noted in HPI.    Objective:  Physical Exam Blood pressure 129/77, pulse 67, height 5\' 3"  (1.6 m), weight 151 lb 1.3 oz (68.5 kg), last menstrual period 12/30/2018.  CONSTITUTIONAL: Well-developed, well-nourished female in no acute distress.  HENT:  Normocephalic, atraumatic EYES: Conjunctivae and EOM are normal. No scleral icterus.  NECK: Normal range of motion SKIN: Skin is warm and dry. No rash noted. Not diaphoretic.No pallor. [place]: Alert and oriented to person, place, and time. Normal coordination.  Abd: Soft, nondistended; there is tenderness on the left side and there is some rebound tenderness when palpating the right.  Pelvic: Normal appearing external genitalia; normal appearing vaginal mucosa and cervix.  No CMT. Normal discharge.  Small uterus, no other palpable masses, no uterine tenderness. There is adnexal tenderness but, it is NOT related to the enlarged ovary on the left. The ovary palpates large but, is mobile and is NOT the pain that pt reports. The right ovary is also palpable and not tender. There is tenderness of the uterosacral ligaments but no nodularity is palpated.     Labs and Imaging [place] [facility] Transvaginal  Result Date: 12/07/2018 CLINICAL DATA:  Left-sided pelvic pain for 4 weeks.   Endometriosis. EXAM: TRANSABDOMINAL AND TRANSVAGINAL ULTRASOUND OF PELVIS TECHNIQUE: Both transabdominal and transvaginal ultrasound examinations of the pelvis were performed. Transabdominal technique was performed for global imaging of the pelvis including uterus, ovaries, adnexal regions, and pelvic cul-de-sac. It was necessary to proceed with endovaginal exam following the transabdominal exam to visualize the endometrium and ovaries. COMPARISON:  06/17/2015 from [place]: Uterus Measurements: 7.3 x 3.9 x 5.0 cm = volume: 74 mL. Retroflexed. No fibroids or other mass visualized. Endometrium Thickness: 4 mm.  No focal abnormality visualized. Right ovary Measurements: 2.5 x 1.4 x 1.8 cm = volume: 3.4 mL. Normal appearance/no adnexal mass. Left ovary Measurements: 4.4 x 2.9 x 3.8 cm = volume: 26 mL. Simple cyst is seen which measures 4.0 x 2.8 x 3.5 cm. No complex features seen. Other findings Tiny amount of simple free fluid, likely physiologic. IMPRESSION: 4.0 cm benign-appearing left ovarian cyst. Normal appearance of uterus and right ovary. Electronically Signed   By: Marlaine Hind M.D.   On: 12/07/2018 15:47   [place] [facility] Pelvis Complete  Result Date: 12/07/2018 CLINICAL DATA:  Left-sided pelvic pain for 4 weeks.  Endometriosis. EXAM: TRANSABDOMINAL AND TRANSVAGINAL ULTRASOUND OF PELVIS TECHNIQUE: Both transabdominal and transvaginal ultrasound examinations of the pelvis were performed. Transabdominal technique was performed for global imaging of the pelvis including uterus, ovaries, adnexal regions, and pelvic cul-de-sac. It was necessary to proceed with endovaginal exam following the transabdominal exam to visualize the endometrium and ovaries. COMPARISON:  06/17/2015 from [place]: Uterus Measurements: 7.3 x 3.9 x 5.0 cm = volume:  74 mL. Retroflexed. No fibroids or other mass visualized. Endometrium Thickness: 4 mm.  No focal abnormality visualized.  Right ovary Measurements: 2.5 x 1.4 x 1.8 cm = volume: 3.4 mL. Normal appearance/no adnexal mass. Left ovary Measurements: 4.4 x 2.9 x 3.8 cm = volume: 26 mL. Simple cyst is seen which measures 4.0 x 2.8 x 3.5 cm. No complex features seen. Other findings Tiny amount of simple free fluid, likely physiologic. IMPRESSION: 4.0 cm benign-appearing left ovarian cyst. Normal appearance of uterus and right ovary. Electronically Signed   By: Marlaine Hind M.D.   On: 12/07/2018 15:47   US Breast Ltd Uni Right Inc Axilla  Result Date: 12/19/2018 CLINICAL DATA:  Screening recall for bilateral breast asymmetries. History of remote bilateral reduction mammoplasties. EXAM: DIGITAL DIAGNOSTIC BILATERAL MAMMOGRAM WITH CAD AND TOMO COMPARISON:  Previous exam(s). ACR Breast Density Category c: The breast tissue is heterogeneously dense, which may obscure small masses. FINDINGS: Spot compression tomograms were performed of the bilateral breasts. The initially questioned possible right breast asymmetry is less apparent on the additional imaging with the fibroglandular pattern in this location similar when compared to the prior exams. Initially questioned left breast asymmetry resolves on the additional imaging with findings compatible with area of overlapping fibroglandular tissue. Mammographic images were processed with CAD. Targeted ultrasound of the upper-outer right breast was performed. No suspicious masses or abnormality seen, only dense band of fibroglandular tissue identified. This is felt to correspond well with the asymmetry seen in the upper-outer right breast at mammography. IMPRESSION: No findings of malignancy in either breast. RECOMMENDATION: Screening mammogram in one year.(Code:SM-B-01Y) I have discussed the findings and recommendations with the patient. If applicable, a reminder letter will be sent to the patient regarding the next appointment. BI-RADS CATEGORY  1: Negative. Electronically Signed   By: Everlean Alstrom M.D.   On: 12/19/2018 11:11   Mm Diag Breast Tomo Bilateral  Result Date: 12/19/2018 CLINICAL DATA:  Screening recall for bilateral breast asymmetries. History of remote bilateral reduction mammoplasties. EXAM: DIGITAL DIAGNOSTIC BILATERAL MAMMOGRAM WITH CAD AND TOMO COMPARISON:  Previous exam(s). ACR Breast Density Category c: The breast tissue is heterogeneously dense, which may obscure small masses. FINDINGS: Spot compression tomograms were performed of the bilateral breasts. The initially questioned possible right breast asymmetry is less apparent on the additional imaging with the fibroglandular pattern in this location similar when compared to the prior exams. Initially questioned left breast asymmetry resolves on the additional imaging with findings compatible with area of overlapping fibroglandular tissue. Mammographic images were processed with CAD. Targeted ultrasound of the upper-outer right breast was performed. No suspicious masses or abnormality seen, only dense band of fibroglandular tissue identified. This is felt to correspond well with the asymmetry seen in the upper-outer right breast at mammography. IMPRESSION: No findings of malignancy in either breast. RECOMMENDATION: Screening mammogram in one year.(Code:SM-B-01Y) I have discussed the findings and recommendations with the patient. If applicable, a reminder letter will be sent to the patient regarding the next appointment. BI-RADS CATEGORY  1: Negative. Electronically Signed   By: Everlean Alstrom M.D.   On: 12/19/2018 11:11   Mm 3d Screen Breast Bilateral  Result Date: 12/12/2018 CLINICAL DATA:  Screening. Remote prior BILATERAL reduction mammoplasty. EXAM: DIGITAL SCREENING BILATERAL MAMMOGRAM WITH TOMO AND CAD COMPARISON:  Previous exam(s). ACR Breast Density Category b: There are scattered areas of fibroglandular density. FINDINGS: In the right breast a possible focal asymmetry requires further evaluation. In the left  breast a possible  focal asymmetry requires further evaluation. Images were processed with CAD. IMPRESSION: Further evaluation is suggested for possible asymmetry in the right breast. Further evaluation is suggested for possible asymmetry in the left breast. RECOMMENDATION: Diagnostic mammogram and possibly ultrasound of both breasts. (Code:[id number]-24M) The patient will be contacted regarding the findings, and additional imaging will be scheduled. BI-RADS CATEGORY  0: Incomplete. Need additional imaging evaluation and/or prior mammograms for comparison. Electronically Signed   By: Evangeline Dakin M.D.   On: 12/12/2018 13:10    Assessment & Plan:  Pelvic pain though due to endometriosis. The cyst is present but not tender on exam.   Reviewed tx options. Including hysterectomy and endometrial ablation and conserviatve option of LnIUD (pt had Mirena prev and had it removed after 1 year) and meds with Freida Busman (pt prev had Depo Lupron which caused worsening HA).  I am concerned that if the AUB is managed with a endometrial ablation, the pain will become worse. Will see if it improves on Orilissa.   Orilissa 150mg  daily Need to have ofc call rep to ascertain if there are discounts for self pay  F/u in 3 months or sooner prn   Total face-to-face time with patient was 20 min.  Greater than 50% was spent in counseling and coordination of care with the patient.   Larena Ohnemus L. Harraway-Smith, M.D., [name]

## 2019-01-01 NOTE — Patient Instructions (Signed)
Elagolix tablets What is this medicine? ELAGOLIX (el a GOE lix) is used to treat endometriosis in women. It reduces pain from the condition and may help reduce painful sexual intercourse. This medicine may be used for other purposes; ask your health care provider or pharmacist if you have questions. COMMON BRAND NAME(S): Freida Busman What should I tell my health care provider before I take this medicine? They need to know if you have any of these conditions:  liver disease  mental illness  osteoporosis  suicidal thoughts, plans, or attempt; a previous suicide attempt by you or a family member  an unusual or allergic reaction to elagolix, other medicines, foods, dyes, or preservatives  pregnant or trying to get pregnant  breast-feeding How should I use this medicine? Take this medicine by mouth with a glass of water. You can take it with or without food. Follow the directions on the prescription label. Take this medicine at the same time each day. Do not take your medicine more often than directed. A special MedGuide will be given to you by the pharmacist with each prescription and refill. Be sure to read this information carefully each time. Talk to your pediatrician regarding the use of this medicine in children. This medicine is not approved for use in children. Overdosage: If you think you have taken too much of this medicine contact a poison control center or emergency room at once. NOTE: This medicine is only for you. Do not share this medicine with others. What if I miss a dose? If you miss a dose, take it as soon as you can. If it is almost time for your next dose, take only that dose. Do not take double or extra doses. What may interact with this medicine? Do not take this medicine with any of the following medications:  cyclosporine  gemfibrozil This medicine may also interact with the following medications:  certain antiviral medicines for hepatitis, HIV or  AIDS  citalopram  digoxin  female hormones, like estrogens or progestins and birth control pills, patches, rings, or injections  methadone  midazolam  omeprazole  rifampin  rosuvastatin This list may not describe all possible interactions. Give your health care provider a list of all the medicines, herbs, non-prescription drugs, or dietary supplements you use. Also tell them if you smoke, drink alcohol, or use illegal drugs. Some items may interact with your medicine. What should I watch for while using this medicine? Visit your doctor or health care professional for regular checks on your progress. This medicine may cause weak bones (osteoporosis). Only use this product for the amount of time your health care professional tells you to. The longer you use this product the more likely you will be at risk for weak bones. Ask your health care professional how you can keep strong bones. You may have a change in bleeding pattern or irregular periods. Many females stop having periods while taking this drug. This medicine does not prevent pregnancy. Women must use effective birth control with this medicine. Use a non-hormonal form of birth control while taking this medicine and for 1 week after stopping it. Talk to your health care professional about how to prevent pregnancy. Do not become pregnant while taking this medicine. Women should inform their doctor if they wish to become pregnant or think they might be pregnant. There is a potential for serious side effects to an unborn child. Talk to your health care professional or pharmacist for more information. Patients and their families should watch  out for new or worsening depression or thoughts of suicide. Also watch out for sudden changes in feelings such as feeling anxious, agitated, panicky, irritable, hostile, aggressive, impulsive, severely restless, overly excited and hyperactive, or not being able to sleep. If this happens, call your health  care professional. What side effects may I notice from receiving this medicine? Side effects that you should report to your doctor or health care professional as soon as possible:  allergic reactions like skin rash, itching or hives, swelling of the face, lips, or tongue  anxious  depressed mood  signs and symptoms of liver injury like dark yellow or brown urine; general ill feeling or flu-like symptoms; light-colored stools; loss of appetite; nausea; right upper belly pain; unusually weak or tired; yellowing of the eyes or skin  suicidal thoughts or other mood changes Side effects that usually do not require medical attention (report these to your doctor or health care professional if they continue or are bothersome):  reduced or absent menstrual periods  headache  hot flashes or night sweats  nausea  joint pain  trouble sleeping This list may not describe all possible side effects. Call your doctor for medical advice about side effects. You may report side effects to FDA at 1-800-FDA-1088. Where should I keep my medicine? Keep out of the reach of children. Store at room temperature between 2 and 30 degrees C (36 and 86 degrees F). Throw away any unused medicine after the expiration date on the label. Discard any unused medicine and used packaging carefully. Follow the directions in the [place]. Do NOT flush down the toilet. NOTE: This sheet is a summary. It may not cover all possible information. If you have questions about this medicine, talk to your doctor, pharmacist, or health care provider.  2020 Elsevier/Gold Standard (2017-09-27 12:46:01)

## 2019-01-05 ENCOUNTER — Encounter: Payer: Self-pay | Admitting: Family Medicine

## 2019-01-05 MED ORDER — FLUOCINOLONE ACETONIDE 0.01 % OT OIL: TOPICAL_OIL | OTIC | 0 refills | Status: DC

## 2019-01-24 ENCOUNTER — Telehealth: Payer: Self-pay

## 2019-01-24 ENCOUNTER — Encounter: Payer: Self-pay | Admitting: Family Medicine

## 2019-01-24 ENCOUNTER — Ambulatory Visit: Payer: BC Managed Care – PPO | Attending: Internal Medicine

## 2019-01-24 ENCOUNTER — Ambulatory Visit (INDEPENDENT_AMBULATORY_CARE_PROVIDER_SITE_OTHER): Payer: BC Managed Care – PPO | Admitting: Family Medicine

## 2019-01-24 VITALS — BP 176/101 | HR 99

## 2019-01-24 DIAGNOSIS — Z20828 Contact with and (suspected) exposure to other viral communicable diseases: Secondary | ICD-10-CM | POA: Diagnosis not present

## 2019-01-24 DIAGNOSIS — N921 Excessive and frequent menstruation with irregular cycle: Secondary | ICD-10-CM | POA: Diagnosis not present

## 2019-01-24 DIAGNOSIS — R030 Elevated blood-pressure reading, without diagnosis of hypertension: Secondary | ICD-10-CM | POA: Insufficient documentation

## 2019-01-24 DIAGNOSIS — Z20822 Contact with and (suspected) exposure to covid-19: Secondary | ICD-10-CM

## 2019-01-24 MED ORDER — METOPROLOL SUCCINATE ER 25 MG PO TB24: 25.0000 mg | ORAL_TABLET | Freq: Every day | ORAL | 3 refills | Status: DC

## 2019-01-24 NOTE — Patient Instructions (Signed)
Take metoprolol xl 25 mg once daily (start tonight)  If low blood pressure or side effects please stop it  If severely high bp (220/120 or above), or chest pain or shortness of breath go to the ER   Check in with your gyn re: the medication and blood pressure Increase fluid intake Watch BP and pulse (update [place] tomorrow with how you feel)  Make an appointment to come in for a visit next week (after a negative covid test)   I will write a work note-you can get it through [employer] [place] posted

## 2019-01-24 NOTE — Telephone Encounter (Signed)
Today pts BP at 8 AM was 142/90 ? P. pt is at work now and cannot reck BPor P. pthas H/A pain level 6, lightheaded on and off, weakness and chills. Pt does not think she has fever but will reck fever, BP and P when gets home after 2PM today. On 01/22/19 pt started with BP being more elevated 140 -160/85-100. On 01/22/19 pt spoke with GYN who advised pt to go to ED due to heavy bleeding; pt has endometriosis and was changing pads 4-5 times an hr. Pt did not go to ED due to fear of exposure to covid.Pt said heavy bleeding stopped on 01/23/19 and today bleeding lightly. On 01/23/19 BP was 160/100. Pt scheduled virtual appt today with Dr Glori Bickers at 4:15. (oked by Dr Glori Bickers). UC & ED precautions given and pt voiced understanding.

## 2019-01-24 NOTE — Assessment & Plan Note (Signed)
Elevated bp for 3-4 d with headache and fatigue in pt battling endometriosis with very heavy bleeding  Unsure if orilissa (new) could cause inc in bp (headache is a common side effect) Sent in metoprolol xl 25 mg to take one daily to control bp (pt will monitor bp and pulse) Inc fluid intake  inst pt to check in with her gyn re: medication and bleeding  Plan to f/u next week (pending neg covid test) for visit and labs  Disc red flags/parameters to go to ER and she voiced understanding

## 2019-01-24 NOTE — Assessment & Plan Note (Signed)
S/p very heavy menses So far [name] is not helping  ? If anemic (pt is light headed/tired)  Enc fluids Also inst pt to call her gyn

## 2019-01-24 NOTE — Telephone Encounter (Signed)
I will see her then  

## 2019-01-24 NOTE — Progress Notes (Signed)
Virtual Visit via Video Note  I connected with [name] on 01/24/19 at  4:15 PM EST by a video enabled telemedicine application and verified that I am speaking with the correct person using two identifiers.  Location: Patient: home Provider: office    I discussed the limitations of evaluation and management by telemedicine and the availability of in person appointments. The patient expressed understanding and agreed to proceed.  Parties involved in encounter  Patient: [name]   Provider:  Loura Pardon MD    History of Present Illness: Pt presents with elevated BP  bp has been high for the past 3 days   Monday woke up with heavy menstrual bleeding (4-5 pads per hour)  Has endometriosis   That is now stopped  Feels weak /fatigue  Some chills - temp is 98.1   (no respiratory symptoms)   She checked bp on Monday 160/100 -when weak  147/98 later in the day   Now 176/100 Then 166/98   Has frontal headache -started last night  Is throbbing  No cp  Has been light headed (afraid to drive) -- worse at night   She feels a little dehydrated - drinking a lot of water   Not common for her   Monday am she took a tramadol (1/2)   BP Readings from Last 3 Encounters:  01/24/19 (!) 176/101  01/01/19 129/77  12/07/18 (!) 147/63   Pulse was 99   (normally in the 60s)  As been higher lately   Had a covid test today     HTN does not commonly run in the family  Not very anxious   On orilissa for 4 wk  [place] antagonist -- has not noticed any help yet   Patient Active Problem List   Diagnosis Date Noted  . Elevated blood pressure reading 01/24/2019  . Screening mammogram, encounter for 10/18/2018  . Vitamin D deficiency 08/22/2018  . Encounter for routine gynecological examination 02/02/2017  . Paresthesia of arm 10/13/2016  . Eczema 01/29/2015  . Health maintenance examination 05/16/2014  . Paresthesias 05/16/2014  . Anxiety disorder 03/19/2013  . Pelvic  pain in female 01/09/2013  . Heavy menses 01/03/2013  . ADD (attention deficit disorder) 02/29/2012  . Fibromyalgia 03/26/2011  . Myofascial pain 03/19/2011  . Gluten intolerance 01/22/2011  . Overweight (BMI 25.0-29.9) 11/09/2010  . FIBROCYSTIC BREAST DISEASE 03/27/2010  . CAVERNOUS HEMANGIOMA, LIVER 12/03/2009  . PERSONAL HISTORY OF COLONIC POLYPS 12/03/2009  . ALLERGIC RHINITIS 03/09/2008  . URINARY FREQUENCY, CHRONIC 01/31/2007  . B12 deficiency 10/28/2006  . GERD 04/27/2006  . Endometriosis 04/27/2006  . RENAL CALCULUS, HX OF 04/27/2006  . MIGRAINES, HX OF 04/27/2006   Past Medical History:  Diagnosis Date  . ADD (attention deficit disorder)   . Endometriosis   . Family history of malignant neoplasm of gastrointestinal tract   . Fatigue   . Fibromyalgia   . GERD (gastroesophageal reflux disease)   . Kidney stone   . Migraine   . Personal history of colonic polyps 06/01/2006   sessil serrated adenoma  . Vitamin B12 deficiency    Past Surgical History:  Procedure Laterality Date  . BREAST SURGERY  2004   breast reduction  . CHOLECYSTECTOMY    . LAPAROSCOPY  11/2004   endometriosis  . OVARIAN CYST SURGERY    . REDUCTION MAMMAPLASTY    . TUBAL LIGATION  2008  . WISDOM TOOTH EXTRACTION     with four other teeth as well  Social History   Tobacco Use  . Smoking status: Former Smoker    Packs/day: 0.25    Quit date: 01/25/2005    Years since quitting: 14.0  . Smokeless tobacco: Never Used  Substance Use Topics  . Alcohol use: Yes    Comment: rare  . Drug use: No   Family History  Problem Relation Age of Onset  . Cervical cancer Mother 54  . Hypertension Mother   . Diabetes Mother   . Kidney cancer Mother 7  . Cancer Mother        Melanoma  . Kidney failure Brother 36       s/p transplant ?due to PCKD or renal stones  . Wilson's disease Father        liver transplant  . Ulcerative colitis Father   . Liver disease Father   . Colon polyps Sister   .  Breast cancer Maternal Aunt   . Breast cancer Maternal Grandmother   . Colon cancer Paternal Grandfather   . Esophageal cancer Neg Hx   . Stomach cancer Neg Hx   . Rectal cancer Neg Hx    Allergies  Allergen Reactions  . Strawberry Extract Anaphylaxis and Hives  . Clindamycin     REACTION: hives, angioedema  . Doxycycline     axillary rash   . Gabapentin     Dizzy/ worse headache  . Penicillins     REACTION: hives, angioedema  . Sulfa Antibiotics    Current Outpatient Medications on File Prior to Visit  Medication Sig Dispense Refill  . Elagolix Sodium (ORILISSA) 150 MG TABS Take 1 capsule by mouth daily. 30 tablet 3  . Fluocinolone Acetonide 0.01 % OIL Apply 5 drops into affected ears twice daily 20 mL 0  . NEOMYCIN-POLYMYXIN-HYDROCORTISONE (CORTISPORIN) 1 % SOLN OTIC solution Place 3 drops into both ears every 6 (six) hours. 10 mL 0  . traMADol (ULTRAM) 50 MG tablet Take 1 tablet (50 mg total) by mouth every 6 (six) hours as needed for severe pain. 30 tablet 0   No current facility-administered medications on file prior to visit.   Review of Systems  Constitutional: Positive for chills and malaise/fatigue. Negative for fever.  HENT: Negative for congestion, ear pain, sinus pain and sore throat.   Eyes: Negative for blurred vision, discharge and redness.  Respiratory: Negative for cough, shortness of breath and stridor.   Cardiovascular: Negative for chest pain, palpitations and leg swelling.  Gastrointestinal: Negative for abdominal pain, diarrhea, nausea and vomiting.  Musculoskeletal: Negative for myalgias.  Skin: Negative for rash.  Neurological: Positive for dizziness and headaches.      Observations/Objective: Patient appears well, in no distress Seemingly fatigued  Weight is baseline  No facial swelling or asymmetry Normal voice-not hoarse and no slurred speech No obvious tremor or mobility impairment Moving neck and UEs normally Able to hear the call well  No  cough or shortness of breath during interview  Talkative and mentally sharp with no cognitive changes No skin changes on face or neck , no rash or pallor Affect is normal    Assessment and Plan: Problem List Items Addressed This Visit      Other   Heavy menses    S/p very heavy menses So far Freida Busman is not helping  ? If anemic (pt is light headed/tired)  Enc fluids Also inst pt to call her gyn       Elevated blood pressure reading - Primary    Elevated bp for  3-4 d with headache and fatigue in pt battling endometriosis with very heavy bleeding  Unsure if orilissa (new) could cause inc in bp (headache is a common side effect) Sent in metoprolol xl 25 mg to take one daily to control bp (pt will monitor bp and pulse) Inc fluid intake  inst pt to check in with her gyn re: medication and bleeding  Plan to f/u next week (pending neg covid test) for visit and labs  Disc red flags/parameters to go to ER and she voiced understanding             Follow Up Instructions: Take metoprolol xl 25 mg once daily (start tonight)  If low blood pressure or side effects please stop it  If severely high bp (220/120 or above), or chest pain or shortness of breath go to the ER   Check in with your gyn re: the medication and blood pressure Increase fluid intake Watch BP and pulse (update [place] tomorrow with how you feel)  Make an appointment to come in for a visit next week (after a negative covid test)   I will write a work note-you can get it through [employer] [place] posted    I discussed the assessment and treatment plan with the patient. The patient was provided an opportunity to ask questions and all were answered. The patient agreed with the plan and demonstrated an understanding of the instructions.   The patient was advised to call back or seek an in-person evaluation if the symptoms worsen or if the condition fails to improve as anticipated.     Susan Pardon, MD

## 2019-01-25 ENCOUNTER — Encounter: Payer: Self-pay | Admitting: Family Medicine

## 2019-01-25 LAB — NOVEL CORONAVIRUS, NAA: SARS-CoV-2, NAA: NOT DETECTED

## 2019-02-01 ENCOUNTER — Other Ambulatory Visit: Payer: Self-pay | Admitting: Family Medicine

## 2019-02-01 NOTE — Telephone Encounter (Signed)
Rx was filled on 01/05/19 during a mychart message with PCP, please advise

## 2019-02-02 ENCOUNTER — Ambulatory Visit: Payer: BC Managed Care – PPO | Admitting: Family Medicine

## 2019-02-06 ENCOUNTER — Encounter: Payer: Self-pay | Admitting: Family Medicine

## 2019-02-06 ENCOUNTER — Ambulatory Visit (INDEPENDENT_AMBULATORY_CARE_PROVIDER_SITE_OTHER): Payer: BC Managed Care – PPO | Admitting: Family Medicine

## 2019-02-06 ENCOUNTER — Other Ambulatory Visit: Payer: Self-pay

## 2019-02-06 VITALS — BP 118/78 | HR 59 | Temp 97.0°F | Ht 63.0 in | Wt 157.4 lb

## 2019-02-06 DIAGNOSIS — N809 Endometriosis, unspecified: Secondary | ICD-10-CM

## 2019-02-06 DIAGNOSIS — R030 Elevated blood-pressure reading, without diagnosis of hypertension: Secondary | ICD-10-CM

## 2019-02-06 DIAGNOSIS — E663 Overweight: Secondary | ICD-10-CM

## 2019-02-06 DIAGNOSIS — N921 Excessive and frequent menstruation with irregular cycle: Secondary | ICD-10-CM

## 2019-02-06 DIAGNOSIS — R5382 Chronic fatigue, unspecified: Secondary | ICD-10-CM | POA: Diagnosis not present

## 2019-02-06 DIAGNOSIS — E538 Deficiency of other specified B group vitamins: Secondary | ICD-10-CM

## 2019-02-06 NOTE — Assessment & Plan Note (Signed)
Discussed how this problem influences overall health and the risks it imposes  Reviewed plan for weight loss with lower calorie diet (via better food choices and also portion control or program like weight watchers) and exercise building up to or more than 30 minutes 5 days per week including some aerobic activity    

## 2019-02-06 NOTE — Assessment & Plan Note (Signed)
With endometriosis  For gyn f/u  Cbc today

## 2019-02-06 NOTE — Assessment & Plan Note (Signed)
Lab today  Some fatigue

## 2019-02-06 NOTE — Assessment & Plan Note (Signed)
Reviewed last visit with elevated bp in setting of heavy menses  This improved with metoprolol xl 25 mg -then became too low Now bp is stable w/o meds  BP: 118/78   Will continue to watch Also lab today

## 2019-02-06 NOTE — Progress Notes (Signed)
Subjective:    Patient ID: [name], female    DOB: September 14, 1980, 39 y.o.   MRN: [id number]  This visit occurred during the SARS-CoV-2 public health emergency.  Safety protocols were in place, including screening questions prior to the visit, additional usage of staff PPE, and extensive cleaning of exam room while observing appropriate contact time as indicated for disinfecting solutions.    HPI Pt presents to f/u for elevated blood pressure and heavy menses  Wt Readings from Last 3 Encounters:  02/06/19 157 lb 7 oz (71.4 kg)  01/01/19 151 lb 1.3 oz (68.5 kg)  12/07/18 154 lb (69.9 kg)   27.89 kg/m    Last visit disc high BP ? If her new Freida Busman for endometriosis could play a role  Sent in metoprolol xl 25 mg to take one daily and monitor bp and pulse (her pulse had also prev been high)  She has a follow up in Friday with gyn  Still taking orilissa    BP Readings from Last 3 Encounters:  02/06/19 118/78  01/24/19 (!) 176/101  01/01/19 129/77   Pulse Readings from Last 3 Encounters:  02/06/19 (!) 59  01/24/19 99  01/01/19 67   She had a neg covid test on 12/30   She took the medicine several days  Then it dropped too low and she stopped it   Has been well controlled  132/80 is the highest it has been since jan 3   Last period was very heavy - esp for first several days  Has had to miss work  Having a period about every 2 weeks   She has tried a lot of hormone treatments - caused side eff  Tried brief lupron - not an option to continue  She tried the IUD - it made her bleed more instead of less  She gets frequent ovarian cysts   She would like to get labs to check for anemia  She is a vegetarian  Beans/ lentils/nuts for protein  Also protein powder Does not take any iron    Lab Results  Component Value Date   VITAMINB12 227 10/13/2018   She takes 2500 mg of B12 once weekly  Patient Active Problem List   Diagnosis Date Noted  . Elevated blood  pressure reading 01/24/2019  . Screening mammogram, encounter for 10/18/2018  . Vitamin D deficiency 08/22/2018  . Encounter for routine gynecological examination 02/02/2017  . Paresthesia of arm 10/13/2016  . Eczema 01/29/2015  . Health maintenance examination 05/16/2014  . Paresthesias 05/16/2014  . Anxiety disorder 03/19/2013  . Pelvic pain in female 01/09/2013  . Heavy menses 01/03/2013  . ADD (attention deficit disorder) 02/29/2012  . Fibromyalgia 03/26/2011  . Myofascial pain 03/19/2011  . Gluten intolerance 01/22/2011  . Overweight (BMI 25.0-29.9) 11/09/2010  . Fatigue 05/18/2010  . FIBROCYSTIC BREAST DISEASE 03/27/2010  . CAVERNOUS HEMANGIOMA, LIVER 12/03/2009  . PERSONAL HISTORY OF COLONIC POLYPS 12/03/2009  . ALLERGIC RHINITIS 03/09/2008  . URINARY FREQUENCY, CHRONIC 01/31/2007  . Vitamin B12 deficiency 10/28/2006  . GERD 04/27/2006  . Endometriosis 04/27/2006  . RENAL CALCULUS, HX OF 04/27/2006  . MIGRAINES, HX OF 04/27/2006   Past Medical History:  Diagnosis Date  . ADD (attention deficit disorder)   . Endometriosis   . Family history of malignant neoplasm of gastrointestinal tract   . Fatigue   . Fibromyalgia   . GERD (gastroesophageal reflux disease)   . Kidney stone   . Migraine   . Personal  history of colonic polyps 06/01/2006   sessil serrated adenoma  . Vitamin B12 deficiency    Past Surgical History:  Procedure Laterality Date  . BREAST SURGERY  2004   breast reduction  . CHOLECYSTECTOMY    . LAPAROSCOPY  11/2004   endometriosis  . OVARIAN CYST SURGERY    . REDUCTION MAMMAPLASTY    . TUBAL LIGATION  2008  . WISDOM TOOTH EXTRACTION     with four other teeth as well   Social History   Tobacco Use  . Smoking status: Former Smoker    Packs/day: 0.25    Quit date: 01/25/2005    Years since quitting: 14.0  . Smokeless tobacco: Never Used  Substance Use Topics  . Alcohol use: Yes    Comment: rare  . Drug use: No   Family History  Problem  Relation Age of Onset  . Cervical cancer Mother 3  . Hypertension Mother   . Diabetes Mother   . Kidney cancer Mother 14  . Cancer Mother        Melanoma  . Kidney failure Brother 62       s/p transplant ?due to PCKD or renal stones  . Wilson's disease Father        liver transplant  . Ulcerative colitis Father   . Liver disease Father   . Colon polyps Sister   . Breast cancer Maternal Aunt   . Breast cancer Maternal Grandmother   . Colon cancer Paternal Grandfather   . Esophageal cancer Neg Hx   . Stomach cancer Neg Hx   . Rectal cancer Neg Hx    Allergies  Allergen Reactions  . Strawberry Extract Anaphylaxis and Hives  . Clindamycin     REACTION: hives, angioedema  . Doxycycline     axillary rash   . Gabapentin     Dizzy/ worse headache  . Penicillins     REACTION: hives, angioedema  . Sulfa Antibiotics    Current Outpatient Medications on File Prior to Visit  Medication Sig Dispense Refill  . Elagolix Sodium (ORILISSA) 150 MG TABS Take 1 capsule by mouth daily. 30 tablet 3  . Fluocinolone Acetonide 0.01 % OIL APPLY 5 DROPS INTO AFFECTED EARS TWICE DAILY 20 mL 1  . NEOMYCIN-POLYMYXIN-HYDROCORTISONE (CORTISPORIN) 1 % SOLN OTIC solution Place 3 drops into both ears every 6 (six) hours. 10 mL 0  . traMADol (ULTRAM) 50 MG tablet Take 1 tablet (50 mg total) by mouth every 6 (six) hours as needed for severe pain. 30 tablet 0   No current facility-administered medications on file prior to visit.    Review of Systems  Constitutional: Positive for fatigue. Negative for activity change, appetite change, fever and unexpected weight change.  HENT: Negative for congestion, ear pain, rhinorrhea, sinus pressure and sore throat.   Eyes: Negative for pain, redness and visual disturbance.  Respiratory: Negative for cough, shortness of breath and wheezing.   Cardiovascular: Negative for chest pain and palpitations.  Gastrointestinal: Negative for abdominal pain, blood in stool,  constipation and diarrhea.  Endocrine: Negative for polydipsia and polyuria.  Genitourinary: Positive for menstrual problem and pelvic pain. Negative for dysuria, frequency and urgency.  Musculoskeletal: Negative for arthralgias, back pain and myalgias.  Skin: Negative for pallor and rash.  Allergic/Immunologic: Negative for environmental allergies.  Neurological: Negative for dizziness, syncope and headaches.  Hematological: Negative for adenopathy. Does not bruise/bleed easily.  Psychiatric/Behavioral: Negative for decreased concentration and dysphoric mood. The patient is not nervous/anxious.  Objective:   Physical Exam Constitutional:      General: She is not in acute distress.    Appearance: Normal appearance. She is well-developed and normal weight. She is not ill-appearing or diaphoretic.  HENT:     Head: Normocephalic and atraumatic.     Mouth/Throat:     Mouth: Mucous membranes are moist.  Eyes:     General: No scleral icterus.    Conjunctiva/sclera: Conjunctivae normal.     Pupils: Pupils are equal, round, and reactive to light.  Neck:     Thyroid: No thyromegaly.     Vascular: No carotid bruit or JVD.  Cardiovascular:     Rate and Rhythm: Regular rhythm. Bradycardia present.     Pulses: Normal pulses.     Heart sounds: Normal heart sounds. No gallop.   Pulmonary:     Effort: Pulmonary effort is normal. No respiratory distress.     Breath sounds: Normal breath sounds. No wheezing or rales.  Abdominal:     General: Bowel sounds are normal. There is no distension or abdominal bruit.     Palpations: Abdomen is soft. There is no mass.     Tenderness: There is no abdominal tenderness.     Hernia: No hernia is present.     Comments: Mild suprapubic tenderness   Musculoskeletal:     Cervical back: Normal range of motion and neck supple.     Right lower leg: No edema.     Left lower leg: No edema.  Lymphadenopathy:     Cervical: No cervical adenopathy.  Skin:     General: Skin is warm and dry.     Coloration: Skin is not pale.     Findings: No erythema or rash.  Neurological:     Mental Status: She is alert. Mental status is at baseline.     Motor: No weakness.     Coordination: Coordination normal.     Deep Tendon Reflexes: Reflexes are normal and symmetric. Reflexes normal.  Psychiatric:        Mood and Affect: Mood normal.           Assessment & Plan:   Problem List Items Addressed This Visit      Other   Vitamin B12 deficiency    Lab today  Some fatigue       Relevant Orders   Vitamin B12   Endometriosis    Pt has not noticed imp with Freida Busman  Has failed multiple tx  She has gyn f/u planned Having heavy menses every 2 weeks  Cbc today- to see if anemic given fatigue May consider hysterectomy      Fatigue    Suspect multifactorial Concern for possible anemia given heavy menses      Relevant Orders   CBC w/Diff   TSH   Vitamin B12   Overweight (BMI 25.0-29.9)    Discussed how this problem influences overall health and the risks it imposes  Reviewed plan for weight loss with lower calorie diet (via better food choices and also portion control or program like weight watchers) and exercise building up to or more than 30 minutes 5 days per week including some aerobic activity         Heavy menses    With endometriosis  For gyn f/u  Cbc today      Relevant Orders   CBC w/Diff   Ferritin   Elevated blood pressure reading - Primary    Reviewed last visit with elevated bp  in setting of heavy menses  This improved with metoprolol xl 25 mg -then became too low Now bp is stable w/o meds  BP: 118/78   Will continue to watch Also lab today      Relevant Orders   Comprehensive metabolic panel   CBC w/Diff   TSH

## 2019-02-06 NOTE — Assessment & Plan Note (Signed)
Suspect multifactorial Concern for possible anemia given heavy menses

## 2019-02-06 NOTE — Patient Instructions (Addendum)
Let's get labs  Glad your blood pressure is better  Keep eating healthy  Exercise when you can   Follow up with gyn   Take care of yourself

## 2019-02-06 NOTE — Assessment & Plan Note (Signed)
Pt has not noticed imp with [name]  Has failed multiple tx  She has gyn f/u planned Having heavy menses every 2 weeks  Cbc today- to see if anemic given fatigue May consider hysterectomy

## 2019-02-07 LAB — COMPREHENSIVE METABOLIC PANEL
ALT: 7 U/L (ref 0–35)
AST: 13 U/L (ref 0–37)
Albumin: 4.3 g/dL (ref 3.5–5.2)
Alkaline Phosphatase: 44 U/L (ref 39–117)
BUN: 9 mg/dL (ref 6–23)
CO2: 27 mEq/L (ref 19–32)
Calcium: 9.2 mg/dL (ref 8.4–10.5)
Chloride: 102 mEq/L (ref 96–112)
Creatinine, Ser: 0.72 mg/dL (ref 0.40–1.20)
GFR: 89.29 mL/min (ref >60.00)
Glucose, Bld: 93 mg/dL (ref 70–99)
Potassium: 3.9 mEq/L (ref 3.5–5.1)
Sodium: 136 mEq/L (ref 135–145)
Total Bilirubin: 0.8 mg/dL (ref 0.2–1.2)
Total Protein: 6.9 g/dL (ref 6.0–8.3)

## 2019-02-07 LAB — CBC WITH DIFFERENTIAL/PLATELET
Basophils Absolute: 0.1 10*3/uL (ref 0.0–0.1)
Basophils Relative: 1.1 % (ref 0.0–3.0)
Eosinophils Absolute: 0.1 10*3/uL (ref 0.0–0.7)
Eosinophils Relative: 1.7 % (ref 0.0–5.0)
HCT: 35.6 % — ABNORMAL LOW (ref 36.0–46.0)
Hemoglobin: 11.9 g/dL — ABNORMAL LOW (ref 12.0–15.0)
Lymphocytes Relative: 31.4 % (ref 12.0–46.0)
Lymphs Abs: 1.8 10*3/uL (ref 0.7–4.0)
MCHC: 33.4 g/dL (ref 30.0–36.0)
MCV: 89.8 fl (ref 78.0–100.0)
Monocytes Absolute: 0.8 10*3/uL (ref 0.1–1.0)
Monocytes Relative: 13.4 % — ABNORMAL HIGH (ref 3.0–12.0)
Neutro Abs: 3 10*3/uL (ref 1.4–7.7)
Neutrophils Relative %: 52.4 % (ref 43.0–77.0)
Platelets: 208 10*3/uL (ref 150.0–400.0)
RBC: 3.97 Mil/uL (ref 3.87–5.11)
RDW: 13.2 % (ref 11.5–15.5)
WBC: 5.7 10*3/uL (ref 4.0–10.5)

## 2019-02-07 LAB — FERRITIN: Ferritin: 10.3 ng/mL (ref 10.0–291.0)

## 2019-02-07 LAB — TSH: TSH: 2.17 u[IU]/mL (ref 0.35–4.50)

## 2019-02-07 LAB — VITAMIN B12: Vitamin B-12: 185 pg/mL — ABNORMAL LOW (ref 211–911)

## 2019-02-08 ENCOUNTER — Encounter: Payer: Self-pay | Admitting: Family Medicine

## 2019-02-09 ENCOUNTER — Other Ambulatory Visit: Payer: Self-pay

## 2019-02-09 ENCOUNTER — Encounter: Payer: Self-pay | Admitting: Obstetrics & Gynecology

## 2019-02-09 ENCOUNTER — Ambulatory Visit (INDEPENDENT_AMBULATORY_CARE_PROVIDER_SITE_OTHER): Payer: BC Managed Care – PPO | Admitting: Obstetrics & Gynecology

## 2019-02-09 VITALS — BP 132/73 | HR 63 | Ht 63.0 in | Wt 155.0 lb

## 2019-02-09 DIAGNOSIS — N83202 Unspecified ovarian cyst, left side: Secondary | ICD-10-CM

## 2019-02-09 DIAGNOSIS — N809 Endometriosis, unspecified: Secondary | ICD-10-CM | POA: Diagnosis not present

## 2019-02-09 DIAGNOSIS — N939 Abnormal uterine and vaginal bleeding, unspecified: Secondary | ICD-10-CM | POA: Diagnosis not present

## 2019-02-09 DIAGNOSIS — R102 Pelvic and perineal pain: Secondary | ICD-10-CM | POA: Diagnosis not present

## 2019-02-09 NOTE — Patient Instructions (Signed)
Total Laparoscopic Hysterectomy °A total laparoscopic hysterectomy is a minimally invasive surgery to remove the uterus and cervix. The fallopian tubes and ovaries can also be removed (bilateral salpingo-oophorectomy) during this surgery, if necessary. This procedure may be done to treat problems such as: °· Noncancerous growths in the uterus (uterine fibroids) that cause symptoms. °· A condition that causes the lining of the uterus (endometrium) to grow in other areas (endometriosis). °· Problems with pelvic support. This is caused by weakened muscles of the pelvis following vaginal childbirth or menopause. °· Cancer of the cervix, ovaries, uterus, or endometrium. °· Excessive (dysfunctional) uterine bleeding. °This surgery is performed by inserting a thin, lighted tube (laparoscope) and surgical instruments into small incisions in the abdomen. The laparoscope sends images to a monitor. The images help the health care provider perform the procedure. After this procedure, you will no longer be able to have a baby, and you will no longer have a menstrual period. °Tell a health care provider about: °· Any allergies you have. °· All medicines you are taking, including vitamins, herbs, eye drops, creams, and over-the-counter medicines. °· Any problems you or family members have had with anesthetic medicines. °· Any blood disorders you have. °· Any surgeries you have had. °· Any medical conditions you have. °· Whether you are pregnant or may be pregnant. °What are the risks? °Generally, this is a safe procedure. However, problems may occur, including: °· Infection. °· Bleeding. °· Blood clots in the legs or lungs. °· Allergic reactions to medicines. °· Damage to other structures or organs. °· The risk that the surgery may have to be switched to the regular one in which a large incision is made in the abdomen (abdominal hysterectomy). °What happens before the procedure? °Staying hydrated °Follow instructions from your  health care provider about hydration, which may include: °· Up to 2 hours before the procedure - you may continue to drink clear liquids, such as water, clear fruit juice, black coffee, and plain tea °Eating and drinking restrictions °Follow instructions from your health care provider about eating and drinking, which may include: °· 8 hours before the procedure - stop eating heavy meals or foods such as meat, fried foods, or fatty foods. °· 6 hours before the procedure - stop eating light meals or foods, such as toast or cereal. °· 6 hours before the procedure - stop drinking milk or drinks that contain milk. °· 2 hours before the procedure - stop drinking clear liquids. °Medicines °· Ask your health care provider about: °? Changing or stopping your regular medicines. This is especially important if you are taking diabetes medicines or blood thinners. °? Taking over-the-counter medicines, vitamins, herbs, and supplements. °? Taking medicines such as aspirin and ibuprofen. These medicines can thin your blood. Do not take these medicines unless your health care provider tells you to take them. °· You may be given antibiotic medicine to help prevent infection. °· You may be asked to take laxatives. °· You may be given medicines to help prevent nausea and vomiting after the procedure. °General instructions °· Ask your health care provider how your surgical site will be marked or identified. °· You may be asked to shower with a germ-killing soap. °· Do not use any products that contain nicotine or tobacco, such as cigarettes and e-cigarettes. If you need help quitting, ask your health care provider. °· You may have an exam or testing, such as an ultrasound to determine the size and shape of your pelvic organs. °·   You may have a blood or urine sample taken. °· This procedure can affect the way you feel about yourself. Talk with your health care provider about the physical and emotional changes hysterectomy may  cause. °· Plan to have someone take you home from the hospital or clinic. °· Plan to have a responsible adult care for you for at least 24 hours after you leave the hospital or clinic. This is important. °What happens during the procedure? °· To lower your risk of infection: °? Your health care team will wash or sanitize their hands. °? Your skin will be washed with soap. °? Hair may be removed from the surgical area. °· An IV will be inserted into one of your veins. °· You will be given one or more of the following: °? A medicine to help you relax (sedative). °? A medicine to make you fall asleep (general anesthetic). °· You will be given antibiotic medicine through your IV. °· A tube may be inserted down your throat to help you breathe during the procedure. °· A gas (carbon dioxide) will be used to inflate your abdomen to allow your surgeon to see inside of your abdomen. °· Three or four small incisions will be made in your abdomen. °· A laparoscope will be inserted into one of your incisions. Surgical instruments will be inserted through the other incisions in order to perform the procedure. °· Your uterus and cervix may be removed through your vagina or cut into small pieces and removed through the small incisions. Any other organs that need to be removed will also be removed this way. °· Carbon dioxide will be released from inside of your abdomen. °· Your incisions will be closed with stitches (sutures). °· A bandage (dressing) may be placed over your incisions. °The procedure may vary among health care providers and hospitals. °What happens after the procedure? °· Your blood pressure, heart rate, breathing rate, and blood oxygen level will be monitored until the medicines you were given have worn off. °· You will be given medicine for pain and nausea as needed. °· Do not drive for 24 hours if you received a sedative. °Summary °· Total Laparoscopic hysterectomy is a procedure to remove your uterus, cervix and  sometimes the fallopian tubes and ovaries. °· This procedure can affect the way you feel about yourself. Talk with your health care provider about the physical and emotional changes hysterectomy may cause. °· After this procedure, you will no longer be able to have a baby, and you will no longer have a menstrual period. °· You will be given pain medicine to control discomfort after this procedure. °This information is not intended to replace advice given to you by your health care provider. Make sure you discuss any questions you have with your health care provider. °Document Revised: 12/24/2016 Document Reviewed: 03/24/2016 °Elsevier Patient Education © 2020 [employer] °Total Laparoscopic Hysterectomy, Care After °This sheet gives you information about how to care for yourself after your procedure. Your health care provider may also give you more specific instructions. If you have problems or questions, contact your health care provider. °What can I expect after the procedure? °After the procedure, it is common to have: °· Pain and bruising around your incisions. °· A sore throat, if a breathing tube was used during surgery. °· Fatigue. °· Poor appetite. °· Less interest in sex. °If your ovaries were also removed, it is also common to have symptoms of menopause such as hot flashes, night sweats,   and lack of sleep (insomnia). °Follow these instructions at home: °Bathing °· Do not take baths, swim, or use a hot tub until your health care provider approves. You may need to only take showers for 2-3 weeks. °· Keep your bandage (dressing) dry until your health care provider says it can be removed. °Incision care ° °· Follow instructions from your health care provider about how to take care of your incisions. Make sure you: °? Wash your hands with soap and water before you change your dressing. If soap and water are not available, use hand sanitizer. °? Change your dressing as told by your health care  provider. °? Leave stitches (sutures), skin glue, or adhesive strips in place. These skin closures may need to stay in place for 2 weeks or longer. If adhesive strip edges start to loosen and curl up, you may trim the loose edges. Do not remove adhesive strips completely unless your health care provider tells you to do that. °· Check your incision area every day for signs of infection. Check for: °? Redness, swelling, or pain. °? Fluid or blood. °? Warmth. °? Pus or a bad smell. °Activity °· Get plenty of rest and sleep. °· Do not lift anything that is heavier than 10 lbs (4.5 kg) for one month after surgery, or as long as told by your health care provider. °· Do not drive or use heavy machinery while taking prescription pain medicine. °· Do not drive for 24 hours if you were given a medicine to help you relax (sedative). °· Return to your normal activities as told by your health care provider. Ask your health care provider what activities are safe for you. °Lifestyle ° °· Do not use any products that contain nicotine or tobacco, such as cigarettes and e-cigarettes. These can delay healing. If you need help quitting, ask your health care provider. °· Do not drink alcohol until your health care provider approves. °General instructions °· Do not douche, use tampons, or have sex for at least 6 weeks, or as told by your health care provider. °· Take over-the-counter and prescription medicines only as told by your health care provider. °· To monitor yourself for a fever, take your temperature at least once a day during recovery. °· If you struggle with physical or emotional changes after your procedure, speak with your health care provider or a therapist. °· To prevent or treat constipation while you are taking prescription pain medicine, your health care provider may recommend that you: °? Drink enough fluid to keep your urine clear or pale yellow. °? Take over-the-counter or prescription medicines. °? Eat foods that  are high in fiber, such as fresh fruits and vegetables, whole grains, and beans. °? Limit foods that are high in fat and processed sugars, such as fried and sweet foods. °· Keep all follow-up visits as told by your health care provider. This is important. °Contact a health care provider if: °· You have chills or a fever. °· You have redness, swelling, or pain around an incision. °· You have fluid or blood coming from an incision. °· Your incision feels warm to the touch. °· You have pus or a bad smell coming from an incision. °· An incision breaks open. °· You feel dizzy or light-headed. °· You have pain or bleeding when you urinate. °· You have diarrhea, nausea, or vomiting that does not go away. °· You have abnormal vaginal discharge. °· You have a rash. °· You have pain that does   not get better with medicine. °Get help right away if: °· You have a fever and your symptoms suddenly get worse. °· You have severe abdominal pain. °· You have chest pain. °· You have shortness of breath. °· You faint. °· You have pain, swelling, or redness on your leg. °· You have heavy vaginal bleeding with blood clots. °Summary °· After the procedure it is common to have abdominal pain. Your provider will give you medication for this. °· Do not take baths, swim, or use a hot tub until your health care provider approves. °· Do not lift anything that is heavier than 10 lbs (4.5 kg) for one month after surgery, or as long as told by your health care provider. °· Notify your provider if you have any signs or symptoms of infection after the procedure. °This information is not intended to replace advice given to you by your health care provider. Make sure you discuss any questions you have with your health care provider. °Document Revised: 12/24/2016 Document Reviewed: 03/24/2016 °Elsevier Patient Education © 2020 [employer] Inc. ° °

## 2019-02-09 NOTE — Progress Notes (Signed)
History:  39 y.o. [id number] here today for pelvic pain though to be related to endometriosis.  Pt was started on [place] 01/01/2019. Since that time, she has had heavy bleeding, worsening pelvic pain and elevated blood pressure. Her primary care provider placed her on meds which then . \  PAP was neg with neg hrHPV 2019.   The following portions of the patient's history were reviewed and updated as appropriate: allergies, current medications, past family history, past medical history, past social history, past surgical history and problem list.  Review of Systems:  Pertinent items are noted in HPI.    Objective:  Physical Exam Weight 155 lb (70.3 kg), last menstrual period 01/24/2019.  CONSTITUTIONAL: Well-developed, well-nourished female in no acute distress.  HENT:  Normocephalic, atraumatic EYES: Conjunctivae and EOM are normal. No scleral icterus.  NECK: Normal range of motion SKIN: Skin is warm and dry. No rash noted. Not diaphoretic.No pallor. [place]: Alert and oriented to person, place, and time. Normal coordination.  Abd: Soft, nontender and nondistended Pelvic: Normal appearing external genitalia; normal appearing vaginal mucosa and cervix.  Normal discharge.  Small uterus, no other palpable masses, no uterine or adnexal tenderness  Labs and Imaging 12/07/2018 CLINICAL DATA:  Left-sided pelvic pain for 4 weeks.  Endometriosis.  EXAM: TRANSABDOMINAL AND TRANSVAGINAL ULTRASOUND OF PELVIS  TECHNIQUE: Both transabdominal and transvaginal ultrasound examinations of the pelvis were performed. Transabdominal technique was performed for global imaging of the pelvis including uterus, ovaries, adnexal regions, and pelvic cul-de-sac. It was necessary to proceed with endovaginal exam following the transabdominal exam to visualize the endometrium and ovaries.  COMPARISON:  06/17/2015 from [facility]: Uterus  Measurements: 7.3 x 3.9 x 5.0 cm =  volume: 74 mL. Retroflexed. No fibroids or other mass visualized.  Endometrium  Thickness: 4 mm.  No focal abnormality visualized.  Right ovary  Measurements: 2.5 x 1.4 x 1.8 cm = volume: 3.4 mL. Normal appearance/no adnexal mass.  Left ovary  Measurements: 4.4 x 2.9 x 3.8 cm = volume: 26 mL. Simple cyst is seen which measures 4.0 x 2.8 x 3.5 cm. No complex features seen.  Other findings  Tiny amount of simple free fluid, likely physiologic.  IMPRESSION: 4.0 cm benign-appearing left ovarian cyst.  Normal appearance of uterus and right ovary.  Assessment & Plan:  Pelvic pain and left ov cyst due to endometriosis not improved with pharmacologic methods. Pt has tried multiple different treatment option in the past and she is interested in definitive treatment.    Patient desires surgical management with Robot assisted total laparoscopic hysterectomy with bilateral salpingectomy and left ovarian cystectomy.   The risks of surgery were discussed in detail with the patient including but not limited to: bleeding which may require transfusion or reoperation; infection which may require prolonged hospitalization or re-hospitalization and antibiotic therapy; injury to bowel, bladder, ureters and major vessels or other surrounding organs; need for additional procedures including laparotomy; thromboembolic phenomenon, incisional problems and other postoperative or anesthesia complications.  Patient was told that the likelihood that her condition and symptoms will be treated effectively with this surgical management was very high; the postoperative expectations were also discussed in detail. The patient also understands the alternative treatment options which were discussed in full. All questions were answered.  She was told that she will be contacted by our surgical scheduler regarding the time and date of her surgery; routine preoperative instructions of having nothing to eat or drink  after midnight on the  day prior to surgery and also coming to the hospital 1 1/2 hours prior to her time of surgery were also emphasized.  She was told she may be called for a preoperative appointment about a week prior to surgery and will be given further preoperative instructions at that visit. Printed patient education handouts about the procedure were given to the patient to review at home.  Recommend stopping Freida Busman.   Tonatiuh Mallon L. Harraway-Smith, M.D., [name]

## 2019-02-27 ENCOUNTER — Ambulatory Visit: Payer: BC Managed Care – PPO

## 2019-03-09 DIAGNOSIS — D2271 Melanocytic nevi of right lower limb, including hip: Secondary | ICD-10-CM | POA: Diagnosis not present

## 2019-03-09 DIAGNOSIS — L821 Other seborrheic keratosis: Secondary | ICD-10-CM | POA: Diagnosis not present

## 2019-03-09 DIAGNOSIS — D225 Melanocytic nevi of trunk: Secondary | ICD-10-CM | POA: Diagnosis not present

## 2019-03-09 DIAGNOSIS — D485 Neoplasm of uncertain behavior of skin: Secondary | ICD-10-CM | POA: Diagnosis not present

## 2019-03-09 DIAGNOSIS — D1801 Hemangioma of skin and subcutaneous tissue: Secondary | ICD-10-CM | POA: Diagnosis not present

## 2019-03-09 DIAGNOSIS — L578 Other skin changes due to chronic exposure to nonionizing radiation: Secondary | ICD-10-CM | POA: Diagnosis not present

## 2019-03-15 ENCOUNTER — Ambulatory Visit: Payer: BC Managed Care – PPO

## 2019-03-22 ENCOUNTER — Ambulatory Visit (INDEPENDENT_AMBULATORY_CARE_PROVIDER_SITE_OTHER): Payer: BC Managed Care – PPO

## 2019-03-22 ENCOUNTER — Other Ambulatory Visit: Payer: Self-pay

## 2019-03-22 DIAGNOSIS — E538 Deficiency of other specified B group vitamins: Secondary | ICD-10-CM

## 2019-03-22 MED ORDER — CYANOCOBALAMIN 1000 MCG/ML IJ SOLN
1000.0000 ug | Freq: Once | INTRAMUSCULAR | Status: AC
  Administered 2019-03-22: 1000 ug via INTRAMUSCULAR

## 2019-03-22 NOTE — Progress Notes (Signed)
Per orders of Dr. Duncan, injection of vit B12 given by Samreet Edenfield. Patient tolerated injection well.  

## 2019-03-30 NOTE — Patient Instructions (Addendum)
[name]  03/30/2019     YOU ARE SCHEDULED FOR A COVID TEST 04-06-19@ 1:30 PM. THIS TEST MUST BE DONE BEFORE SURGERY. GO TO  [address], [place], [address] AND REMAIN IN YOUR CAR, THIS IS A DRIVE UP TEST. ONCE YOUR COVID TEST IS DONE PLEASE FOLLOW ALL THE QUARANTINE  INSTRUCTIONS GIVEN IN YOUR HANDOUT.      Your procedure is scheduled on 04-10-19   Report to [facility] AT  11:00 A. M.   Call this number if you have problems the morning of surgery  :[phone].   OUR ADDRESS IS [place].  WE ARE LOCATED IN [facility].                                     REMEMBER:NO SOLID FOOD AFTER MIDNIGHT THE NIGHT PRIOR TO SURGERY. NOTHING BY MOUTH EXCEPT CLEAR LIQUIDS UNTIL 7:00 AM. AFTER 7:00 AM, NOTHING UNTIL AFTER SURGERY.  CLEAR LIQUID DIET   Foods Allowed                                                                     Foods Excluded  Coffee and tea, regular and decaf                             liquids that you cannot  Plain Jell-O any favor except red or purple                                           see through such as: Fruit ices (not with fruit pulp)                                     milk, soups, orange juice  Iced Popsicles                                    All solid food Carbonated beverages, regular and diet                                    Cranberry, grape and apple juices Sports drinks like Gatorade Lightly seasoned clear broth or consume(fat free) Sugar, honey syrup   _____________________________________________________________________   YOU MAY  BRUSH YOUR TEETH MORNING OF SURGERY AND RINSE YOUR MOUTH OUT, NO CHEWING GUM CANDY OR MINTS.   TAKE THESE MEDICATIONS MORNING OF SURGERY WITH A SIP OF WATER: NONE  IF YOU ARE SPENDING THE NIGHT AFTER SURGERY PLEASE BRING ALL YOUR PRESCRIPTION MEDICATIONS IN THEIR ORIGINAL BOTTLES. 1 VISITOR IS ALLOWED IN WAITING ROOM ONLY DAY OF SURGERY. NO VISITOR MAY  SPEND THE NIGHT. VISITOR ARE ALLOWED TO STAY UNTIL 800 PM.  DO NOT WEAR JEWERLY, MAKE UP, OR NAIL POLISH ON FINGERNAILS.  DO NOT WEAR LOTIONS, POWDERS, PERFUMES OR DEODORANT.  DO NOT SHAVE FOR 24 HOURS PRIOR TO DAY OF SURGERY.   CONTACTS, GLASSES, OR DENTURES MAY NOT BE WORN TO SURGERY.                                    [facility] IS NOT RESPONSIBLE  FOR ANY BELONGINGS.                                                                    Marland Kitchen              [facility] - Preparing for Surgery Before surgery, you can play an important role.  Because skin is not sterile, your skin needs to be as free of germs as possible.  You can reduce the number of germs on your skin by washing with CHG (chlorahexidine gluconate) soap before surgery.  CHG is an antiseptic cleaner which kills germs and bonds with the skin to continue killing germs even after washing. Please DO NOT use if you have an allergy to CHG or antibacterial soaps.  If your skin becomes reddened/irritated stop using the CHG and inform your nurse when you arrive at Short Stay. Do not shave (including legs and underarms) for at least 48 hours prior to the first CHG shower.  You may shave your face/neck. Please follow these instructions carefully:  1.  Shower with CHG Soap the night before surgery and the  morning of Surgery.  2.  If you choose to wash your hair, wash your hair first as usual with your  normal  shampoo.  3.  After you shampoo, rinse your hair and body thoroughly to remove the  shampoo.                           4.  Use CHG as you would any other liquid soap.  You can apply chg directly  to the skin and wash                       Gently with a scrungie or clean washcloth.  5.  Apply the CHG Soap to your body ONLY FROM THE NECK DOWN.   Do not use on face/ open                           Wound or open sores. Avoid contact with eyes, ears mouth and genitals (private parts).                        Wash face,  Genitals (private parts) with your normal soap.             6.  Wash thoroughly, paying special attention to the area where your surgery  will be performed.  7.  Thoroughly rinse your body with warm water from the neck down.  8.  DO NOT shower/wash with your normal soap after using and rinsing off  the CHG Soap.  9.  Pat yourself dry with a clean towel.            10.  Wear clean pajamas.            11.  Place clean sheets on your bed the night of your first shower and do not  sleep with pets. Day of Surgery : Do not apply any lotions/deodorants the morning of surgery.  Please wear clean clothes to the hospital/surgery center.  FAILURE TO FOLLOW THESE INSTRUCTIONS MAY RESULT IN THE CANCELLATION OF YOUR SURGERY PATIENT SIGNATURE_________________________________  NURSE SIGNATURE__________________________________  ________________________________________________________________________

## 2019-03-30 NOTE — Progress Notes (Signed)
Please place surgery orders. Pt scheduled for PAT appt on 04-03-19.

## 2019-03-30 NOTE — Progress Notes (Signed)
PCP - Loura Pardon, MD  Cardiologist -   Chest x-ray -  EKG -  Stress Test -  ECHO -  Cardiac Cath -   Sleep Study -  CPAP -   Fasting Blood Sugar -  Checks Blood Sugar _____ times a day  Blood Thinner Instructions: Aspirin Instructions: Last Dose:  Anesthesia review:   Patient denies shortness of breath, fever, cough and chest pain at PAT appointment   Patient verbalized understanding of instructions that were given to them at the PAT appointment. Patient was also instructed that they will need to review over the PAT instructions again at home before surgery.

## 2019-04-03 ENCOUNTER — Encounter (HOSPITAL_COMMUNITY): Payer: Self-pay

## 2019-04-03 ENCOUNTER — Encounter (HOSPITAL_COMMUNITY)
Admission: RE | Admit: 2019-04-03 | Discharge: 2019-04-03 | Disposition: A | Discharge status: Home / Self Care | Payer: BC Managed Care – PPO | Source: Ambulatory Visit | Attending: Obstetrics & Gynecology | Admitting: Obstetrics & Gynecology

## 2019-04-03 ENCOUNTER — Other Ambulatory Visit: Payer: Self-pay

## 2019-04-03 DIAGNOSIS — Z01812 Encounter for preprocedural laboratory examination: Secondary | ICD-10-CM | POA: Insufficient documentation

## 2019-04-03 HISTORY — DX: Other complications of anesthesia, initial encounter: T88.59XA

## 2019-04-03 HISTORY — DX: Nausea with vomiting, unspecified: R11.2

## 2019-04-03 HISTORY — DX: Other specified postprocedural states: Z98.890

## 2019-04-03 LAB — CBC
HCT: 39.3 % (ref 36.0–46.0)
Hemoglobin: 12.6 g/dL (ref 12.0–15.0)
MCH: 30.1 pg (ref 26.0–34.0)
MCHC: 32.1 g/dL (ref 30.0–36.0)
MCV: 93.8 fL (ref 80.0–100.0)
Platelets: 214 10*3/uL (ref 150–400)
RBC: 4.19 MIL/uL (ref 3.87–5.11)
RDW: 12.8 % (ref 11.5–15.5)
WBC: 4.8 10*3/uL (ref 4.0–10.5)
nRBC: 0 % (ref 0.0–0.2)

## 2019-04-03 LAB — COMPREHENSIVE METABOLIC PANEL
ALT: 13 U/L (ref 0–44)
AST: 15 U/L (ref 15–41)
Albumin: 4.2 g/dL (ref 3.5–5.0)
Alkaline Phosphatase: 37 U/L — ABNORMAL LOW (ref 38–126)
Anion gap: 6 (ref 5–15)
BUN: 7 mg/dL (ref 6–20)
CO2: 25 mmol/L (ref 22–32)
Calcium: 9.2 mg/dL (ref 8.9–10.3)
Chloride: 108 mmol/L (ref 98–111)
Creatinine, Ser: 0.61 mg/dL (ref 0.44–1.00)
GFR calc Af Amer: >60 mL/min (ref >60)
GFR calc non Af Amer: >60 mL/min (ref >60)
Glucose, Bld: 92 mg/dL (ref 70–99)
Potassium: 4.3 mmol/L (ref 3.5–5.1)
Sodium: 139 mmol/L (ref 135–145)
Total Bilirubin: 0.8 mg/dL (ref 0.3–1.2)
Total Protein: 7.1 g/dL (ref 6.5–8.1)

## 2019-04-03 LAB — ABO/RH: ABO/RH(D): A POS

## 2019-04-06 ENCOUNTER — Other Ambulatory Visit (HOSPITAL_COMMUNITY)
Admission: RE | Admit: 2019-04-06 | Discharge: 2019-04-06 | Disposition: A | Discharge status: Home / Self Care | Payer: BC Managed Care – PPO | Source: Ambulatory Visit | Attending: Obstetrics & Gynecology | Admitting: Obstetrics & Gynecology

## 2019-04-06 DIAGNOSIS — L988 Other specified disorders of the skin and subcutaneous tissue: Secondary | ICD-10-CM | POA: Diagnosis not present

## 2019-04-06 DIAGNOSIS — Z01812 Encounter for preprocedural laboratory examination: Secondary | ICD-10-CM | POA: Diagnosis not present

## 2019-04-06 DIAGNOSIS — Z20822 Contact with and (suspected) exposure to covid-19: Secondary | ICD-10-CM | POA: Diagnosis not present

## 2019-04-06 DIAGNOSIS — D485 Neoplasm of uncertain behavior of skin: Secondary | ICD-10-CM | POA: Diagnosis not present

## 2019-04-06 LAB — SARS CORONAVIRUS 2 (TAT 6-24 HRS): SARS Coronavirus 2: NEGATIVE

## 2019-04-10 ENCOUNTER — Encounter (HOSPITAL_BASED_OUTPATIENT_CLINIC_OR_DEPARTMENT_OTHER)
Admission: RE | Disposition: A | Discharge status: Home / Self Care | Payer: Self-pay | Source: Home / Self Care | Attending: Obstetrics & Gynecology

## 2019-04-10 ENCOUNTER — Encounter (HOSPITAL_BASED_OUTPATIENT_CLINIC_OR_DEPARTMENT_OTHER): Payer: Self-pay | Admitting: Obstetrics & Gynecology

## 2019-04-10 ENCOUNTER — Ambulatory Visit (HOSPITAL_BASED_OUTPATIENT_CLINIC_OR_DEPARTMENT_OTHER): Payer: BC Managed Care – PPO | Admitting: Anesthesiology

## 2019-04-10 ENCOUNTER — Ambulatory Visit (HOSPITAL_BASED_OUTPATIENT_CLINIC_OR_DEPARTMENT_OTHER)
Admission: RE | Admit: 2019-04-10 | Discharge: 2019-04-11 | Disposition: A | Discharge status: Home / Self Care | Payer: BC Managed Care – PPO | Attending: Obstetrics & Gynecology | Admitting: Obstetrics & Gynecology

## 2019-04-10 ENCOUNTER — Ambulatory Visit (HOSPITAL_BASED_OUTPATIENT_CLINIC_OR_DEPARTMENT_OTHER): Payer: BC Managed Care – PPO | Admitting: Physician Assistant

## 2019-04-10 DIAGNOSIS — D259 Leiomyoma of uterus, unspecified: Secondary | ICD-10-CM

## 2019-04-10 DIAGNOSIS — Z87442 Personal history of urinary calculi: Secondary | ICD-10-CM | POA: Insufficient documentation

## 2019-04-10 DIAGNOSIS — K219 Gastro-esophageal reflux disease without esophagitis: Secondary | ICD-10-CM | POA: Insufficient documentation

## 2019-04-10 DIAGNOSIS — Z8249 Family history of ischemic heart disease and other diseases of the circulatory system: Secondary | ICD-10-CM | POA: Insufficient documentation

## 2019-04-10 DIAGNOSIS — G8929 Other chronic pain: Secondary | ICD-10-CM | POA: Diagnosis not present

## 2019-04-10 DIAGNOSIS — E559 Vitamin D deficiency, unspecified: Secondary | ICD-10-CM | POA: Diagnosis not present

## 2019-04-10 DIAGNOSIS — N72 Inflammatory disease of cervix uteri: Secondary | ICD-10-CM | POA: Insufficient documentation

## 2019-04-10 DIAGNOSIS — Z9049 Acquired absence of other specified parts of digestive tract: Secondary | ICD-10-CM | POA: Diagnosis not present

## 2019-04-10 DIAGNOSIS — F419 Anxiety disorder, unspecified: Secondary | ICD-10-CM | POA: Insufficient documentation

## 2019-04-10 DIAGNOSIS — Z841 Family history of disorders of kidney and ureter: Secondary | ICD-10-CM | POA: Insufficient documentation

## 2019-04-10 DIAGNOSIS — R202 Paresthesia of skin: Secondary | ICD-10-CM | POA: Insufficient documentation

## 2019-04-10 DIAGNOSIS — N803 Endometriosis of pelvic peritoneum: Secondary | ICD-10-CM | POA: Insufficient documentation

## 2019-04-10 DIAGNOSIS — N83202 Unspecified ovarian cyst, left side: Secondary | ICD-10-CM | POA: Diagnosis not present

## 2019-04-10 DIAGNOSIS — N888 Other specified noninflammatory disorders of cervix uteri: Secondary | ICD-10-CM | POA: Diagnosis not present

## 2019-04-10 DIAGNOSIS — J309 Allergic rhinitis, unspecified: Secondary | ICD-10-CM | POA: Diagnosis not present

## 2019-04-10 DIAGNOSIS — Z881 Allergy status to other antibiotic agents status: Secondary | ICD-10-CM | POA: Diagnosis not present

## 2019-04-10 DIAGNOSIS — Z8719 Personal history of other diseases of the digestive system: Secondary | ICD-10-CM | POA: Insufficient documentation

## 2019-04-10 DIAGNOSIS — R102 Pelvic and perineal pain: Secondary | ICD-10-CM

## 2019-04-10 DIAGNOSIS — N809 Endometriosis, unspecified: Secondary | ICD-10-CM | POA: Diagnosis present

## 2019-04-10 DIAGNOSIS — Z800 Family history of malignant neoplasm of digestive organs: Secondary | ICD-10-CM | POA: Insufficient documentation

## 2019-04-10 DIAGNOSIS — Z888 Allergy status to other drugs, medicaments and biological substances status: Secondary | ICD-10-CM | POA: Insufficient documentation

## 2019-04-10 DIAGNOSIS — Z87891 Personal history of nicotine dependence: Secondary | ICD-10-CM | POA: Insufficient documentation

## 2019-04-10 DIAGNOSIS — Z885 Allergy status to narcotic agent status: Secondary | ICD-10-CM | POA: Diagnosis not present

## 2019-04-10 DIAGNOSIS — M797 Fibromyalgia: Secondary | ICD-10-CM | POA: Insufficient documentation

## 2019-04-10 DIAGNOSIS — Z86010 Personal history of colonic polyps: Secondary | ICD-10-CM | POA: Insufficient documentation

## 2019-04-10 DIAGNOSIS — Z8371 Family history of colonic polyps: Secondary | ICD-10-CM | POA: Insufficient documentation

## 2019-04-10 DIAGNOSIS — R5383 Other fatigue: Secondary | ICD-10-CM | POA: Diagnosis not present

## 2019-04-10 DIAGNOSIS — Z9889 Other specified postprocedural states: Secondary | ICD-10-CM | POA: Diagnosis present

## 2019-04-10 DIAGNOSIS — N939 Abnormal uterine and vaginal bleeding, unspecified: Secondary | ICD-10-CM | POA: Diagnosis not present

## 2019-04-10 DIAGNOSIS — Z880 Allergy status to penicillin: Secondary | ICD-10-CM | POA: Insufficient documentation

## 2019-04-10 DIAGNOSIS — N83201 Unspecified ovarian cyst, right side: Secondary | ICD-10-CM | POA: Diagnosis not present

## 2019-04-10 DIAGNOSIS — F988 Other specified behavioral and emotional disorders with onset usually occurring in childhood and adolescence: Secondary | ICD-10-CM | POA: Insufficient documentation

## 2019-04-10 DIAGNOSIS — E538 Deficiency of other specified B group vitamins: Secondary | ICD-10-CM | POA: Insufficient documentation

## 2019-04-10 DIAGNOSIS — Z91018 Allergy to other foods: Secondary | ICD-10-CM | POA: Insufficient documentation

## 2019-04-10 DIAGNOSIS — Z833 Family history of diabetes mellitus: Secondary | ICD-10-CM | POA: Insufficient documentation

## 2019-04-10 DIAGNOSIS — G709 Myoneural disorder, unspecified: Secondary | ICD-10-CM | POA: Diagnosis not present

## 2019-04-10 DIAGNOSIS — Z791 Long term (current) use of non-steroidal anti-inflammatories (NSAID): Secondary | ICD-10-CM | POA: Diagnosis not present

## 2019-04-10 DIAGNOSIS — Z8051 Family history of malignant neoplasm of kidney: Secondary | ICD-10-CM | POA: Insufficient documentation

## 2019-04-10 DIAGNOSIS — G43909 Migraine, unspecified, not intractable, without status migrainosus: Secondary | ICD-10-CM | POA: Insufficient documentation

## 2019-04-10 DIAGNOSIS — Z808 Family history of malignant neoplasm of other organs or systems: Secondary | ICD-10-CM | POA: Insufficient documentation

## 2019-04-10 DIAGNOSIS — Z803 Family history of malignant neoplasm of breast: Secondary | ICD-10-CM | POA: Insufficient documentation

## 2019-04-10 DIAGNOSIS — Z882 Allergy status to sulfonamides status: Secondary | ICD-10-CM | POA: Insufficient documentation

## 2019-04-10 HISTORY — PX: ROBOTIC ASSISTED TOTAL HYSTERECTOMY: SHX6085

## 2019-04-10 LAB — POCT PREGNANCY, URINE: Preg Test, Ur: NEGATIVE

## 2019-04-10 LAB — TYPE AND SCREEN
ABO/RH(D): A POS
Antibody Screen: NEGATIVE

## 2019-04-10 SURGERY — HYSTERECTOMY, TOTAL, ROBOT-ASSISTED
Anesthesia: General | Site: Abdomen | Laterality: Bilateral

## 2019-04-10 MED ORDER — CEFAZOLIN SODIUM-DEXTROSE 2-4 GM/100ML-% IV SOLN
2.0000 g | INTRAVENOUS | Status: AC
  Filled 2019-04-10: qty 100

## 2019-04-10 MED ORDER — KETOROLAC TROMETHAMINE 30 MG/ML IJ SOLN
30.0000 mg | Freq: Four times a day (QID) | INTRAMUSCULAR | Status: DC
  Administered 2019-04-10 – 2019-04-11 (×3): 30 mg via INTRAVENOUS
  Filled 2019-04-10: qty 1

## 2019-04-10 MED ORDER — MIDAZOLAM HCL 2 MG/2ML IJ SOLN
INTRAMUSCULAR | Status: AC
  Filled 2019-04-10: qty 2

## 2019-04-10 MED ORDER — ONDANSETRON HCL 4 MG/2ML IJ SOLN
INTRAMUSCULAR | Status: AC
  Filled 2019-04-10: qty 2

## 2019-04-10 MED ORDER — SCOPOLAMINE 1 MG/3DAYS TD PT72
MEDICATED_PATCH | TRANSDERMAL | Status: AC
  Filled 2019-04-10: qty 1

## 2019-04-10 MED ORDER — POLYETHYLENE GLYCOL 3350 17 G PO PACK
17.0000 g | PACK | Freq: Every day | ORAL | Status: DC | PRN
  Filled 2019-04-10: qty 1

## 2019-04-10 MED ORDER — METRONIDAZOLE IN NACL 5-0.79 MG/ML-% IV SOLN
500.0000 mg | INTRAVENOUS | Status: AC
  Administered 2019-04-10: 500 mg via INTRAVENOUS
  Filled 2019-04-10: qty 100

## 2019-04-10 MED ORDER — DOCUSATE SODIUM 100 MG PO CAPS
ORAL_CAPSULE | ORAL | Status: AC
  Filled 2019-04-10: qty 1

## 2019-04-10 MED ORDER — SIMETHICONE 80 MG PO CHEW
80.0000 mg | CHEWABLE_TABLET | Freq: Four times a day (QID) | ORAL | Status: DC | PRN
  Filled 2019-04-10: qty 1

## 2019-04-10 MED ORDER — ARTIFICIAL TEARS OPHTHALMIC OINT
TOPICAL_OINTMENT | OPHTHALMIC | Status: AC
  Filled 2019-04-10: qty 3.5

## 2019-04-10 MED ORDER — SOD CITRATE-CITRIC ACID 500-334 MG/5ML PO SOLN
30.0000 mL | ORAL | Status: AC
  Filled 2019-04-10: qty 30

## 2019-04-10 MED ORDER — LACTATED RINGERS IV SOLN
INTRAVENOUS | Status: DC
  Filled 2019-04-10 (×2): qty 1000

## 2019-04-10 MED ORDER — ONDANSETRON HCL 4 MG/2ML IJ SOLN
4.0000 mg | Freq: Four times a day (QID) | INTRAMUSCULAR | Status: DC | PRN
  Filled 2019-04-10: qty 2

## 2019-04-10 MED ORDER — ONDANSETRON HCL 4 MG/2ML IJ SOLN
INTRAMUSCULAR | Status: DC | PRN
  Administered 2019-04-10: 4 mg via INTRAVENOUS

## 2019-04-10 MED ORDER — FLUORESCEIN SODIUM 10 % IV SOLN
INTRAVENOUS | Status: DC | PRN
  Administered 2019-04-10: 3 mL via INTRAVENOUS

## 2019-04-10 MED ORDER — ACETAMINOPHEN 500 MG PO TABS
ORAL_TABLET | ORAL | Status: AC
  Filled 2019-04-10: qty 2

## 2019-04-10 MED ORDER — KETOROLAC TROMETHAMINE 30 MG/ML IJ SOLN
INTRAMUSCULAR | Status: AC
  Filled 2019-04-10: qty 1

## 2019-04-10 MED ORDER — BISACODYL 10 MG RE SUPP
10.0000 mg | Freq: Every day | RECTAL | Status: DC | PRN
  Filled 2019-04-10: qty 1

## 2019-04-10 MED ORDER — SODIUM CHLORIDE 0.9 % IR SOLN
Status: DC | PRN
  Administered 2019-04-10: 3000 mL

## 2019-04-10 MED ORDER — MIDAZOLAM HCL 5 MG/5ML IJ SOLN
INTRAMUSCULAR | Status: DC | PRN
  Administered 2019-04-10: 2 mg via INTRAVENOUS

## 2019-04-10 MED ORDER — DEXAMETHASONE SODIUM PHOSPHATE 10 MG/ML IJ SOLN
INTRAMUSCULAR | Status: DC | PRN
  Administered 2019-04-10: 10 mg via INTRAVENOUS

## 2019-04-10 MED ORDER — TRAMADOL HCL 50 MG PO TABS
50.0000 mg | ORAL_TABLET | Freq: Four times a day (QID) | ORAL | Status: DC | PRN
  Filled 2019-04-10: qty 1

## 2019-04-10 MED ORDER — IBUPROFEN 800 MG PO TABS
800.0000 mg | ORAL_TABLET | Freq: Four times a day (QID) | ORAL | Status: DC
  Filled 2019-04-10: qty 1

## 2019-04-10 MED ORDER — OXYCODONE-ACETAMINOPHEN 5-325 MG PO TABS
1.0000 | ORAL_TABLET | ORAL | Status: DC | PRN
  Administered 2019-04-10 (×2): 1 via ORAL
  Administered 2019-04-10 – 2019-04-11 (×3): 2 via ORAL
  Filled 2019-04-10: qty 2

## 2019-04-10 MED ORDER — LIDOCAINE 2% (20 MG/ML) 5 ML SYRINGE
INTRAMUSCULAR | Status: DC | PRN
  Administered 2019-04-10: 80 mg via INTRAVENOUS

## 2019-04-10 MED ORDER — PANTOPRAZOLE SODIUM 40 MG PO TBEC
DELAYED_RELEASE_TABLET | ORAL | Status: AC
  Filled 2019-04-10: qty 1

## 2019-04-10 MED ORDER — KETOROLAC TROMETHAMINE 30 MG/ML IJ SOLN
30.0000 mg | Freq: Once | INTRAMUSCULAR | Status: DC
  Filled 2019-04-10: qty 1

## 2019-04-10 MED ORDER — BUPIVACAINE HCL (PF) 0.5 % IJ SOLN
INTRAMUSCULAR | Status: DC | PRN
  Administered 2019-04-10: 30 mL

## 2019-04-10 MED ORDER — ROCURONIUM BROMIDE 10 MG/ML (PF) SYRINGE
PREFILLED_SYRINGE | INTRAVENOUS | Status: AC
  Filled 2019-04-10: qty 10

## 2019-04-10 MED ORDER — PANTOPRAZOLE SODIUM 40 MG PO TBEC
40.0000 mg | DELAYED_RELEASE_TABLET | Freq: Every day | ORAL | Status: DC
  Administered 2019-04-10: 40 mg via ORAL
  Filled 2019-04-10: qty 1

## 2019-04-10 MED ORDER — SUGAMMADEX SODIUM 200 MG/2ML IV SOLN
INTRAVENOUS | Status: DC | PRN
  Administered 2019-04-10: 150 mg via INTRAVENOUS

## 2019-04-10 MED ORDER — OXYCODONE-ACETAMINOPHEN 5-325 MG PO TABS
ORAL_TABLET | ORAL | Status: AC
  Filled 2019-04-10: qty 2

## 2019-04-10 MED ORDER — GENTAMICIN SULFATE 40 MG/ML IJ SOLN
5.0000 mg/kg | INTRAVENOUS | Status: AC
  Administered 2019-04-10: 350 mg via INTRAVENOUS
  Filled 2019-04-10 (×2): qty 8.75

## 2019-04-10 MED ORDER — FENTANYL CITRATE (PF) 250 MCG/5ML IJ SOLN
INTRAMUSCULAR | Status: AC
  Filled 2019-04-10: qty 5

## 2019-04-10 MED ORDER — FENTANYL CITRATE (PF) 100 MCG/2ML IJ SOLN
INTRAMUSCULAR | Status: AC
  Filled 2019-04-10: qty 2

## 2019-04-10 MED ORDER — OXYCODONE HCL 5 MG/5ML PO SOLN
5.0000 mg | Freq: Once | ORAL | Status: DC | PRN
  Filled 2019-04-10: qty 5

## 2019-04-10 MED ORDER — DEXAMETHASONE SODIUM PHOSPHATE 10 MG/ML IJ SOLN
INTRAMUSCULAR | Status: AC
  Filled 2019-04-10: qty 1

## 2019-04-10 MED ORDER — CEFAZOLIN SODIUM-DEXTROSE 2-4 GM/100ML-% IV SOLN
INTRAVENOUS | Status: AC
  Filled 2019-04-10: qty 100

## 2019-04-10 MED ORDER — IBUPROFEN 800 MG PO TABS: 800.0000 mg | ORAL_TABLET | Freq: Three times a day (TID) | ORAL | 0 refills | Status: DC | PRN

## 2019-04-10 MED ORDER — PROPOFOL 10 MG/ML IV BOLUS
INTRAVENOUS | Status: AC
  Filled 2019-04-10: qty 20

## 2019-04-10 MED ORDER — DEXTROSE-NACL 5-0.45 % IV SOLN
INTRAVENOUS | Status: AC
  Filled 2019-04-10: qty 1000

## 2019-04-10 MED ORDER — TRAMADOL HCL 50 MG PO TABS: 100.0000 mg | ORAL_TABLET | Freq: Four times a day (QID) | ORAL | 0 refills | Status: DC | PRN

## 2019-04-10 MED ORDER — OXYCODONE-ACETAMINOPHEN 5-325 MG PO TABS
ORAL_TABLET | ORAL | Status: AC
  Filled 2019-04-10: qty 1

## 2019-04-10 MED ORDER — ONDANSETRON HCL 4 MG PO TABS
4.0000 mg | ORAL_TABLET | Freq: Four times a day (QID) | ORAL | Status: DC | PRN
  Filled 2019-04-10: qty 1

## 2019-04-10 MED ORDER — OXYCODONE HCL 5 MG PO TABS
5.0000 mg | ORAL_TABLET | Freq: Once | ORAL | Status: DC | PRN
  Filled 2019-04-10: qty 1

## 2019-04-10 MED ORDER — ACETAMINOPHEN 500 MG PO TABS
1000.0000 mg | ORAL_TABLET | ORAL | Status: AC
  Administered 2019-04-10: 1000 mg via ORAL
  Filled 2019-04-10: qty 2

## 2019-04-10 MED ORDER — FENTANYL CITRATE (PF) 100 MCG/2ML IJ SOLN
25.0000 ug | INTRAMUSCULAR | Status: DC | PRN
  Administered 2019-04-10 (×3): 50 ug via INTRAVENOUS
  Filled 2019-04-10: qty 1

## 2019-04-10 MED ORDER — ROCURONIUM BROMIDE 50 MG/5ML IV SOSY
PREFILLED_SYRINGE | INTRAVENOUS | Status: DC | PRN
  Administered 2019-04-10: 80 mg via INTRAVENOUS

## 2019-04-10 MED ORDER — FENTANYL CITRATE (PF) 100 MCG/2ML IJ SOLN
INTRAMUSCULAR | Status: DC | PRN
  Administered 2019-04-10: 100 ug via INTRAVENOUS
  Administered 2019-04-10 (×2): 50 ug via INTRAVENOUS

## 2019-04-10 MED ORDER — MENTHOL 3 MG MT LOZG
1.0000 | LOZENGE | OROMUCOSAL | Status: DC | PRN
  Filled 2019-04-10: qty 9

## 2019-04-10 MED ORDER — PROPOFOL 10 MG/ML IV BOLUS
INTRAVENOUS | Status: DC | PRN
  Administered 2019-04-10: 200 mg via INTRAVENOUS

## 2019-04-10 MED ORDER — LIDOCAINE 2% (20 MG/ML) 5 ML SYRINGE
INTRAMUSCULAR | Status: AC
  Filled 2019-04-10: qty 5

## 2019-04-10 MED ORDER — PROMETHAZINE HCL 25 MG/ML IJ SOLN
6.2500 mg | INTRAMUSCULAR | Status: DC | PRN
  Filled 2019-04-10: qty 1

## 2019-04-10 MED ORDER — DOCUSATE SODIUM 100 MG PO CAPS
100.0000 mg | ORAL_CAPSULE | Freq: Two times a day (BID) | ORAL | Status: DC
  Administered 2019-04-10: 100 mg via ORAL
  Filled 2019-04-10: qty 1

## 2019-04-10 MED ORDER — METRONIDAZOLE IN NACL 5-0.79 MG/ML-% IV SOLN
INTRAVENOUS | Status: AC
  Filled 2019-04-10: qty 100

## 2019-04-10 MED ORDER — SCOPOLAMINE 1 MG/3DAYS TD PT72
1.0000 | MEDICATED_PATCH | TRANSDERMAL | Status: DC
  Administered 2019-04-10: 1.5 mg via TRANSDERMAL
  Filled 2019-04-10: qty 1

## 2019-04-10 SURGICAL SUPPLY — 70 items
APPLICATOR ARISTA FLEXITIP XL (MISCELLANEOUS) ×1 IMPLANT
APPLIER CLIP 5 13 M/L LIGAMAX5 (MISCELLANEOUS)
BARRIER ADHS 3X4 INTERCEED (GAUZE/BANDAGES/DRESSINGS) IMPLANT
CANISTER SUCT 3000ML PPV (MISCELLANEOUS) ×2 IMPLANT
CATH FOLEY 3WAY  5CC 16FR (CATHETERS) ×2
CATH FOLEY 3WAY 5CC 16FR (CATHETERS) ×1 IMPLANT
CLIP APPLIE 5 13 M/L LIGAMAX5 (MISCELLANEOUS) IMPLANT
COVER BACK TABLE 60X90IN (DRAPES) ×2 IMPLANT
COVER TIP SHEARS 8 DVNC (MISCELLANEOUS) ×1 IMPLANT
COVER TIP SHEARS 8MM DA VINCI (MISCELLANEOUS) ×2
DECANTER SPIKE VIAL GLASS SM (MISCELLANEOUS) ×2 IMPLANT
DEFOGGER SCOPE WARMER CLEARIFY (MISCELLANEOUS) ×2 IMPLANT
DERMABOND ADVANCED (GAUZE/BANDAGES/DRESSINGS) ×1
DERMABOND ADVANCED .7 DNX12 (GAUZE/BANDAGES/DRESSINGS) ×1 IMPLANT
DRAPE ARM DVNC X/XI (DISPOSABLE) ×3 IMPLANT
DRAPE COLUMN DVNC XI (DISPOSABLE) ×1 IMPLANT
DRAPE DA VINCI XI ARM (DISPOSABLE) ×8
DRAPE DA VINCI XI COLUMN (DISPOSABLE) ×2
DURAPREP 26ML APPLICATOR (WOUND CARE) ×2 IMPLANT
ELECT REM PT RETURN 9FT ADLT (ELECTROSURGICAL) ×2
ELECTRODE REM PT RTRN 9FT ADLT (ELECTROSURGICAL) ×1 IMPLANT
GLOVE BIO SURGEON STRL SZ 6 (GLOVE) ×1 IMPLANT
GLOVE BIO SURGEON STRL SZ 6.5 (GLOVE) ×6 IMPLANT
GLOVE BIOGEL PI IND STRL 7.0 (GLOVE) ×3 IMPLANT
GLOVE BIOGEL PI INDICATOR 7.0 (GLOVE) ×3
GYRUS RUMI II 2.5CM BLUE (DISPOSABLE)
GYRUS RUMI II 3.5CM BLUE (DISPOSABLE)
GYRUS RUMI II 4.0CM BLUE (DISPOSABLE)
HEMOSTAT ARISTA ABSORB 3G PWDR (HEMOSTASIS) ×2 IMPLANT
IRRIG SUCT STRYKERFLOW 2 WTIP (MISCELLANEOUS) ×2
IRRIGATION SUCT STRKRFLW 2 WTP (MISCELLANEOUS) ×1 IMPLANT
LEGGING LITHOTOMY PAIR STRL (DRAPES) ×1 IMPLANT
NEEDLE INSUFFLATION 120MM (ENDOMECHANICALS) ×2 IMPLANT
OBTURATOR OPTICAL STANDARD 8MM (TROCAR) ×2
OBTURATOR OPTICAL STND 8 DVNC (TROCAR) ×1
OBTURATOR OPTICALSTD 8 DVNC (TROCAR) ×1 IMPLANT
OCCLUDER COLPOPNEUMO (BALLOONS) ×2 IMPLANT
PACK ROBOT WH (CUSTOM PROCEDURE TRAY) ×2 IMPLANT
PACK ROBOTIC GOWN (GOWN DISPOSABLE) ×2 IMPLANT
PACK TRENDGUARD 450 HYBRID PRO (MISCELLANEOUS) IMPLANT
PACKING VAGINAL (PACKING) ×1 IMPLANT
PAD PREP 24X48 CUFFED NSTRL (MISCELLANEOUS) ×2 IMPLANT
PROTECTOR NERVE ULNAR (MISCELLANEOUS) ×4 IMPLANT
RUMI II 3.0CM BLUE KOH-EFFICIE (DISPOSABLE) IMPLANT
RUMI II GYRUS 2.5CM BLUE (DISPOSABLE) IMPLANT
RUMI II GYRUS 3.5CM BLUE (DISPOSABLE) IMPLANT
RUMI II GYRUS 4.0CM BLUE (DISPOSABLE) IMPLANT
SEAL CANN UNIV 5-8 DVNC XI (MISCELLANEOUS) ×3 IMPLANT
SEAL XI 5MM-8MM UNIVERSAL (MISCELLANEOUS) ×6
SEALER VESSEL DA VINCI XI (MISCELLANEOUS) ×2
SEALER VESSEL EXT DVNC XI (MISCELLANEOUS) ×1 IMPLANT
SET IRRIG Y TYPE TUR BLADDER L (SET/KITS/TRAYS/PACK) ×2 IMPLANT
SET TRI-LUMEN FLTR TB AIRSEAL (TUBING) ×2 IMPLANT
SUT VIC AB 0 CT1 27 (SUTURE) ×4
SUT VIC AB 0 CT1 27XBRD ANBCTR (SUTURE) ×2 IMPLANT
SUT VICRYL 0 UR6 27IN ABS (SUTURE) IMPLANT
SUT VICRYL 4-0 PS2 18IN ABS (SUTURE) ×4 IMPLANT
SUT VLOC 180 0 9IN  GS21 (SUTURE) ×4
SUT VLOC 180 0 9IN GS21 (SUTURE) ×1 IMPLANT
SYSTEM CARTER THOMASON II (TROCAR) IMPLANT
TIP RUMI ORANGE 6.7MMX12CM (TIP) IMPLANT
TIP UTERINE 5.1X6CM LAV DISP (MISCELLANEOUS) IMPLANT
TIP UTERINE 6.7X10CM GRN DISP (MISCELLANEOUS) IMPLANT
TIP UTERINE 6.7X6CM WHT DISP (MISCELLANEOUS) IMPLANT
TIP UTERINE 6.7X8CM BLUE DISP (MISCELLANEOUS) ×1 IMPLANT
TOWEL OR 17X26 10 PK STRL BLUE (TOWEL DISPOSABLE) ×2 IMPLANT
TRENDGUARD 450 HYBRID PRO PACK (MISCELLANEOUS) ×2
TROCAR BLADELESS OPT 5 100 (ENDOMECHANICALS) ×1 IMPLANT
TROCAR PORT AIRSEAL 5X120 (TROCAR) ×2 IMPLANT
WATER STERILE IRR 1000ML POUR (IV SOLUTION) ×2 IMPLANT

## 2019-04-10 NOTE — Discharge Instructions (Signed)

## 2019-04-10 NOTE — Brief Op Note (Signed)
04/10/2019  2:29 PM  PATIENT:  [name]  39 y.o. female  PRE-OPERATIVE DIAGNOSIS:  Pelvic Pain  Left Ovarian Cyst; AUB  POST-OPERATIVE DIAGNOSIS:  Pelvic Pain; AUB    PROCEDURE:  Procedure(s): XI ROBOTIC ASSISTED TOTAL HYSTERECTOMY AND SALPINGECTOMY AND RIGHT OVARIAN CYSTECTOMY (Bilateral)  SURGEON:  Surgeon(s) and Role:    * Lavonia Drafts, MD - Primary    * Constant, Peggy, MD - Assisting  ANESTHESIA:   general  EBL:  50 mL   BLOOD ADMINISTERED:none  DRAINS: none   LOCAL MEDICATIONS USED:  MARCAINE     SPECIMEN:  Source of Specimen:  fallopian tubes, uterus and cervix  DISPOSITION OF SPECIMEN:  PATHOLOGY  COUNTS:  YES  TOURNIQUET:  * No tourniquets in log *  DICTATION: .Note written in EPIC  PLAN OF CARE: prolonged observation then discharge.   PATIENT DISPOSITION:  PACU - hemodynamically stable.   Delay start of Pharmacological VTE agent (>24hrs) due to surgical blood loss or risk of bleeding: not applicable  Complications: none immediate.   Susan Derosa L. Page, M.D., [name]

## 2019-04-10 NOTE — Transfer of Care (Signed)
Immediate Anesthesia Transfer of Care Note  Patient: [name]  Procedure(s) Performed: XI ROBOTIC ASSISTED TOTAL HYSTERECTOMY AND SALPINGECTOMY AND RIGHT OVARIAN CYSTECTOMY (Bilateral Abdomen)  Patient Location: PACU  Anesthesia Type:General  Level of Consciousness: drowsy  Airway & Oxygen Therapy: Patient Spontanous Breathing and Patient connected to nasal cannula oxygen  Post-op Assessment: Report given to RN  Post vital signs: Reviewed and stable  Last Vitals:  Vitals Value Taken Time  BP 147/98 04/10/19 1416  Temp    Pulse 72 04/10/19 1419  Resp 20 04/10/19 1419  SpO2 100 % 04/10/19 1419  Vitals shown include unvalidated device data.  Last Pain:  Vitals:   04/10/19 1018  TempSrc: Oral  PainSc: 0-No pain      Patients Stated Pain Goal: 5 ([id number])  Complications: No apparent anesthesia complications

## 2019-04-10 NOTE — Op Note (Signed)
04/10/2019  2:29 PM  PATIENT:  [name]  39 y.o. female  PRE-OPERATIVE DIAGNOSIS:  Pelvic Pain  Left Ovarian Cyst; AUB  POST-OPERATIVE DIAGNOSIS:  Pelvic Pain; AUB    PROCEDURE:  Procedure(s): XI ROBOTIC ASSISTED TOTAL HYSTERECTOMY AND SALPINGECTOMY AND RIGHT OVARIAN CYSTECTOMY (Bilateral)  SURGEON:  Surgeon(s) and Role:    * Lavonia Drafts, MD - Primary    * Constant, Peggy, MD - Assisting  ANESTHESIA:   general  EBL:  50 mL   BLOOD ADMINISTERED:none  DRAINS: none   LOCAL MEDICATIONS USED:  MARCAINE     SPECIMEN:  Source of Specimen:  fallopian tubes, uterus and cervix  DISPOSITION OF SPECIMEN:  PATHOLOGY  COUNTS:  YES  TOURNIQUET:  * No tourniquets in log *  DICTATION: .Note written in EPIC  PLAN OF CARE: prolonged observation then discharge.   PATIENT DISPOSITION:  PACU - hemodynamically stable.   Delay start of Pharmacological VTE agent (>24hrs) due to surgical blood loss or risk of bleeding: not applicable  Complications: none immediate.   Indications: Para 2 with history of SVD x2 and BTL and AUB. Pt with chronic pelvic pain and a h/o laparoscopy diagnosed endometriosis and left ovarian cyst on [place] here for surgical management of bleeding, pain and ovarian cyst.   Findings: No adhesion noted in the pelvic. The ovaries were normal bilaterally. The left ovary shows evidence of a recently ruptured cyst. The fallopian tubes revealed rings bilaterally.  The uterus appear to have an anterior fibroid.   The risks, benefits, and alternatives of surgery were explained, understood, and accepted. Consents were signed. All questions were answered. She was taken to the operating room and general anesthesia was applied without complication. She was placed in the dorsal lithotomy position and her abdomen and vagina were prepped and draped after she had been carefully positioned on the table. A bimanual exam revealed a 10 week size uterus that was mobile. Her  adnexa were not enlarged. The cervix was measured and the uterus was sounded to 9 cm. A Rumi uterine manipulator was placed without difficulty. A Foley catheter was placed and it drained clear throughout the case. Gloves were changed and attention was turned to the abdomen. A 43mm incision was made in the left upper quadrant with the Optiview and the abdomen was entered under direct visualization. The above findings were noted. CO2 was used to insufflate the abdomen to approximately 4 L. After good pneumoperitoneum was established, a 8 mm trocar was placed above the umbilicus.  Laparoscopy confirmed correct placement. She was placed in Trendelenburg position and ports were placed in appropriate positions on her abdomen to allow maximum exposure during the robotic case. Specifically there was an 30mm assistant port placed in the left lower quadrant under direct laproscopic visualization. Two 8 mm ports were placed 8cm lateral and inferior to the midline port.  These were all placed under direct laparoscopic visualization. The robot was docked and I proceeded with a robotic portion of the case.  The pelvis was inspected and the uterus was found to have a fibroid and be enlarged. There were some evidence of endometrioses near the mesosalpinx. The fallopian tubes and ovaries were found to be normal. The remainder of her pelvis appeared normal. The ureters and the infundibulopelvic ligaments were identified. I excised the fallopian tubes bilaterally. The round ligament on each side was cauterized and cut. The Vessel Sealer instrument was used for this portion. The round ligaments were identified, cauterized and ligated, a bladder  flap was created anteriorly. The uterine vessels were identified and cauterized and then cut.The bladder was pushed out of the operative site and an anterior colpotomy was made. The colpotomy incision was extended circumferentially, following the blue outline of the Rumi manipulator. All  pedicles were hemostatic.  The uterus was removed from the vagina with the fallopian tube segments. The vaginal cuff was closed with v-lock suture.  Excellent hemostasis was noted throughout. The pelvis was irrigated. The intraabdominal pressure was lowered assess hemostasis. After determining excellent hemostasis, I performed cystoscopy. The cystoscopy revealed ejection of yellow colored urine from both ureters. The bladder was intact. The robot was undocked at the time and the skin from all of the  ports were closed with 3-0 vicryl. 30cc of 0.5% Marcaine was injected into the port sites.  The patient was then extubated and taken to recovery in stable condition. The foley was removed before the pt left for the PACU.   An experienced assistant was required given the standard of surgical care given the complexity of the case.  This assistant was needed for exposure, dissection, suctioning, retraction and instrument exchange.  [name], M.D., [name]

## 2019-04-10 NOTE — H&P (Signed)
Preoperative History and Physical  [name] is a 39 y.o. [id number] here for surgical management of Chronic pelvic pain, AUB and left ovarian cyst.   Proposed surgery: robot assisted total laparoscopic hysterectomy with bilateral salpingectomy with left ov cystectomy  Past Medical History:  Diagnosis Date  . ADD (attention deficit disorder)   . Complication of anesthesia   . Endometriosis   . Family history of malignant neoplasm of gastrointestinal tract   . Fatigue   . Fibromyalgia   . GERD (gastroesophageal reflux disease)   . Kidney stone   . Migraine   . Personal history of colonic polyps 06/01/2006   sessil serrated adenoma  . PONV (postoperative nausea and vomiting)   . Vitamin B12 deficiency    Past Surgical History:  Procedure Laterality Date  . BREAST SURGERY  2004   breast reduction  . CHOLECYSTECTOMY    . LAPAROSCOPY  11/2004   endometriosis  . OVARIAN CYST SURGERY    . REDUCTION MAMMAPLASTY    . TUBAL LIGATION  2008  . WISDOM TOOTH EXTRACTION     with four other teeth as well   OB History    Gravida  2   Para  2   Term  1   Preterm  1   AB      Living  2     SAB      TAB      Ectopic      Multiple      Live Births             Patient denies any cervical dysplasia or STIs. Medications Prior to Admission  Medication Sig Dispense Refill Last Dose  . Fluocinolone Acetonide 0.01 % OIL APPLY 5 DROPS INTO AFFECTED EARS TWICE DAILY (Patient taking differently: Place 5 drops into both ears every 7 (seven) days. ) 20 mL 1 04/06/2019  . Multiple Vitamin (MULTIVITAMIN WITH MINERALS) TABS tablet Take 1 tablet by mouth daily.   04/06/2019  . traMADol (ULTRAM) 50 MG tablet Take 1 tablet (50 mg total) by mouth every 6 (six) hours as needed for severe pain. 30 tablet 0 Past Month at Unknown time  . METHYLCOBALAMIN PO Take 5,000 mcg by mouth daily.   Unknown at Unknown time  . NEOMYCIN-POLYMYXIN-HYDROCORTISONE (CORTISPORIN) 1 % SOLN OTIC solution  Place 3 drops into both ears every 6 (six) hours. (Patient not taking: Reported on 03/28/2019) 10 mL 0 More than a month at Unknown time    Allergies  Allergen Reactions  . Strawberry Extract Anaphylaxis and Hives  . Clindamycin Hives    angioedema  . Gabapentin     Dizzy/ worse headache  . Morphine And Related     Agitated and Angry  . Penicillins Hives    Angioedema Did it involve swelling of the face/tongue/throat, SOB, or low BP? No Did it involve sudden or severe rash/hives, skin peeling, or any reaction on the inside of your mouth or nose? No Did you need to seek medical attention at a hospital or doctor's office? No When did it last happen?71 or 40 years old If all above answers are "NO", may proceed with cephalosporin use.   Marland Kitchen Doxycycline Rash  . Sulfa Antibiotics Rash   Social History:   reports that she quit smoking about 14 years ago. She smoked 0.25 packs per day. She has never used smokeless tobacco. She reports current alcohol use. She reports that she does not use drugs. Family History  Problem Relation Age  of Onset  . Cervical cancer Mother 69  . Hypertension Mother   . Diabetes Mother   . Kidney cancer Mother 77  . Cancer Mother        Melanoma  . Kidney failure Brother 77       s/p transplant ?due to PCKD or renal stones  . Wilson's disease Father        liver transplant  . Ulcerative colitis Father   . Liver disease Father   . Colon polyps Sister   . Breast cancer Maternal Aunt   . Breast cancer Maternal Grandmother   . Colon cancer Paternal Grandfather   . Esophageal cancer Neg Hx   . Stomach cancer Neg Hx   . Rectal cancer Neg Hx     Review of Systems: Noncontributory  PHYSICAL EXAM: Blood pressure 139/83, pulse 73, temperature 97.7 F (36.5 C), temperature source Oral, resp. rate 14, height 5' 3.5" (1.613 m), weight 70.6 kg, last menstrual period 04/03/2019, SpO2 100 %. General appearance - alert, well appearing, and in no distress Chest -  clear to auscultation, no wheezes, rales or rhonchi, symmetric air entry Heart - normal rate and regular rhythm Abdomen - soft, nontender, nondistended, no masses or organomegaly Pelvic - examination not indicated Extremities - peripheral pulses normal, no pedal edema, no clubbing or cyanosis  Labs: Results for orders placed or performed during the hospital encounter of 04/10/19 (from the past 336 hour(s))  Pregnancy, urine POC   Collection Time: 04/10/19  9:57 AM  Result Value Ref Range   Preg Test, Ur NEGATIVE NEGATIVE  Results for orders placed or performed during the hospital encounter of 04/06/19 (from the past 336 hour(s))  SARS CORONAVIRUS 2 (TAT 6-24 HRS) Nasopharyngeal Nasopharyngeal Swab   Collection Time: 04/06/19  1:36 PM   Specimen: Nasopharyngeal Swab  Result Value Ref Range   SARS Coronavirus 2 NEGATIVE NEGATIVE  Results for orders placed or performed during the hospital encounter of 04/03/19 (from the past 336 hour(s))  CBC   Collection Time: 04/03/19  8:53 AM  Result Value Ref Range   WBC 4.8 4.0 - 10.5 K/uL   RBC 4.19 3.87 - 5.11 MIL/uL   Hemoglobin 12.6 12.0 - 15.0 g/dL   HCT 39.3 36.0 - 46.0 %   MCV 93.8 80.0 - 100.0 fL   MCH 30.1 26.0 - 34.0 pg   MCHC 32.1 30.0 - 36.0 g/dL   RDW 12.8 11.5 - 15.5 %   Platelets 214 150 - 400 K/uL   nRBC 0.0 0.0 - 0.2 %  Comprehensive metabolic panel   Collection Time: 04/03/19  8:53 AM  Result Value Ref Range   Sodium 139 135 - 145 mmol/L   Potassium 4.3 3.5 - 5.1 mmol/L   Chloride 108 98 - 111 mmol/L   CO2 25 22 - 32 mmol/L   Glucose, Bld 92 70 - 99 mg/dL   BUN 7 6 - 20 mg/dL   Creatinine, Ser 0.61 0.44 - 1.00 mg/dL   Calcium 9.2 8.9 - 10.3 mg/dL   Total Protein 7.1 6.5 - 8.1 g/dL   Albumin 4.2 3.5 - 5.0 g/dL   AST 15 15 - 41 U/L   ALT 13 0 - 44 U/L   Alkaline Phosphatase 37 (L) 38 - 126 U/L   Total Bilirubin 0.8 0.3 - 1.2 mg/dL   GFR calc non Af Amer >60 >60 mL/min   GFR calc Af Amer >60 >60 mL/min   Anion gap 6  5 - 15  Type and screen Posey   Collection Time: 04/03/19  8:53 AM  Result Value Ref Range   ABO/RH(D) A POS    Antibody Screen NEG    Sample Expiration 04/13/2019,2359    Extend sample reason      NO TRANSFUSIONS OR PREGNANCY IN THE PAST 3 MONTHS Performed at [facility], [place] [address], [place], [place] [address]   ABO/Rh   Collection Time: 04/03/19  8:53 AM  Result Value Ref Range   ABO/RH(D)      A POS Performed at [facility], [place] [address], [place], [place] [address]     Imaging Studies: No results found.  Assessment: Patient Active Problem List   Diagnosis Date Noted  . Post-operative state 04/10/2019  . Abnormal uterine bleeding (AUB) 04/10/2019  . Elevated blood pressure reading 01/24/2019  . Screening mammogram, encounter for 10/18/2018  . Vitamin D deficiency 08/22/2018  . Encounter for routine gynecological examination 02/02/2017  . Paresthesia of arm 10/13/2016  . Eczema 01/29/2015  . Health maintenance examination 05/16/2014  . Paresthesias 05/16/2014  . Anxiety disorder 03/19/2013  . Left ovarian cyst 03/07/2013  . Chronic pelvic pain in female 01/09/2013  . Heavy menses 01/03/2013  . ADD (attention deficit disorder) 02/29/2012  . Fibromyalgia 03/26/2011  . Myofascial pain 03/19/2011  . Gluten intolerance 01/22/2011  . Overweight (BMI 25.0-29.9) 11/09/2010  . Fatigue 05/18/2010  . FIBROCYSTIC BREAST DISEASE 03/27/2010  . CAVERNOUS HEMANGIOMA, LIVER 12/03/2009  . PERSONAL HISTORY OF COLONIC POLYPS 12/03/2009  . ALLERGIC RHINITIS 03/09/2008  . URINARY FREQUENCY, CHRONIC 01/31/2007  . Vitamin B12 deficiency 10/28/2006  . GERD 04/27/2006  . Endometriosis 04/27/2006  . RENAL CALCULUS, HX OF 04/27/2006  . MIGRAINES, HX OF 04/27/2006    Plan: Patient will undergo surgical management with robot assisted total laparoscopic hysterectomy with bilateral salpingectomy with left ov cystectomy.    The risks of surgery were discussed in detail with the patient including but not limited to: bleeding which may require transfusion or reoperation; infection which may require antibiotics; injury to surrounding organs which may involve bowel, bladder, ureters ; need for additional procedures including laparoscopy or laparotomy; thromboembolic phenomenon, surgical site problems and other postoperative/anesthesia complications. Likelihood of success in alleviating the patient's condition was discussed. Routine postoperative instructions will be reviewed with the patient and her family in detail after surgery.  The patient concurred with the proposed plan, giving informed written consent for the surgery.  Patient has been NPO since last night she will remain NPO for procedure.  Anesthesia and OR aware.  Preoperative prophylactic antibiotics and SCDs ordered on call to the OR.  To OR when ready.  Daviana Haymaker L. Ihor Dow, M.D., [facility] 04/10/2019 10:59 AM

## 2019-04-10 NOTE — Anesthesia Procedure Notes (Addendum)
Procedure Name: Intubation Date/Time: 04/10/2019 11:55 AM Performed by: Bonney Aid, CRNA Pre-anesthesia Checklist: Patient identified, Emergency Drugs available, Suction available and Patient being monitored Patient Re-evaluated:Patient Re-evaluated prior to induction Oxygen Delivery Method: Circle system utilized Preoxygenation: Pre-oxygenation with 100% oxygen Induction Type: IV induction Ventilation: Mask ventilation without difficulty Laryngoscope Size: Mac and 3 Grade View: Grade II Tube type: Oral Tube size: 7.0 mm Number of attempts: 1 Airway Equipment and Method: Stylet Placement Confirmation: ETT inserted through vocal cords under direct vision,  positive ETCO2 and breath sounds checked- equal and bilateral Secured at: 21 cm Tube secured with: Tape Dental Injury: Teeth and Oropharynx as per pre-operative assessment

## 2019-04-10 NOTE — Anesthesia Preprocedure Evaluation (Addendum)
Anesthesia Evaluation  Patient identified by MRN, date of birth, ID band Patient awake    Reviewed: Allergy & Precautions, NPO status , Patient's Chart, lab work & pertinent test results  History of Anesthesia Complications (+) PONV and history of anesthetic complications  Airway Mallampati: I  TM Distance: >3 FB Neck ROM: Full    Dental  (+) Dental Advisory Given   Pulmonary former smoker,    Pulmonary exam normal        Cardiovascular negative cardio ROS Normal cardiovascular exam     Neuro/Psych  Headaches, PSYCHIATRIC DISORDERS Anxiety  Neuromuscular disease (upper extremity paresthesias)    GI/Hepatic Neg liver ROS, GERD  ,  Endo/Other  negative endocrine ROS  Renal/GU negative Renal ROS     Musculoskeletal  (+) Fibromyalgia -  Abdominal   Peds  (+) ATTENTION DEFICIT DISORDER WITHOUT HYPERACTIVITY Hematology negative hematology ROS (+)   Anesthesia Other Findings Covid neg 04/06/19  Reproductive/Obstetrics  S/p tubal ligation Endometriosis                             Anesthesia Physical Anesthesia Plan  ASA: II  Anesthesia Plan: General   Post-op Pain Management:    Induction: Intravenous  PONV Risk Score and Plan: 4 or greater and Treatment may vary due to age or medical condition, Ondansetron, Dexamethasone, Scopolamine patch - Pre-op and Midazolam  Airway Management Planned: Oral ETT  Additional Equipment: None  Intra-op Plan:   Post-operative Plan: Extubation in OR  Informed Consent: I have reviewed the patients History and Physical, chart, labs and discussed the procedure including the risks, benefits and alternatives for the proposed anesthesia with the patient or authorized representative who has indicated his/her understanding and acceptance.     Dental advisory given  Plan Discussed with: CRNA and Anesthesiologist  Anesthesia Plan Comments:         Anesthesia Quick Evaluation

## 2019-04-11 DIAGNOSIS — D259 Leiomyoma of uterus, unspecified: Secondary | ICD-10-CM | POA: Diagnosis not present

## 2019-04-11 DIAGNOSIS — M797 Fibromyalgia: Secondary | ICD-10-CM | POA: Diagnosis not present

## 2019-04-11 DIAGNOSIS — Z86010 Personal history of colonic polyps: Secondary | ICD-10-CM | POA: Diagnosis not present

## 2019-04-11 DIAGNOSIS — N803 Endometriosis of pelvic peritoneum: Secondary | ICD-10-CM | POA: Diagnosis not present

## 2019-04-11 DIAGNOSIS — K219 Gastro-esophageal reflux disease without esophagitis: Secondary | ICD-10-CM | POA: Diagnosis not present

## 2019-04-11 DIAGNOSIS — N939 Abnormal uterine and vaginal bleeding, unspecified: Secondary | ICD-10-CM | POA: Diagnosis not present

## 2019-04-11 DIAGNOSIS — E538 Deficiency of other specified B group vitamins: Secondary | ICD-10-CM | POA: Diagnosis not present

## 2019-04-11 DIAGNOSIS — Z800 Family history of malignant neoplasm of digestive organs: Secondary | ICD-10-CM | POA: Diagnosis not present

## 2019-04-11 DIAGNOSIS — Z791 Long term (current) use of non-steroidal anti-inflammatories (NSAID): Secondary | ICD-10-CM | POA: Diagnosis not present

## 2019-04-11 DIAGNOSIS — R102 Pelvic and perineal pain: Secondary | ICD-10-CM | POA: Diagnosis not present

## 2019-04-11 DIAGNOSIS — Z87442 Personal history of urinary calculi: Secondary | ICD-10-CM | POA: Diagnosis not present

## 2019-04-11 DIAGNOSIS — Z9049 Acquired absence of other specified parts of digestive tract: Secondary | ICD-10-CM | POA: Diagnosis not present

## 2019-04-11 DIAGNOSIS — G8929 Other chronic pain: Secondary | ICD-10-CM | POA: Diagnosis not present

## 2019-04-11 DIAGNOSIS — F988 Other specified behavioral and emotional disorders with onset usually occurring in childhood and adolescence: Secondary | ICD-10-CM | POA: Diagnosis not present

## 2019-04-11 DIAGNOSIS — N72 Inflammatory disease of cervix uteri: Secondary | ICD-10-CM | POA: Diagnosis not present

## 2019-04-11 DIAGNOSIS — R5383 Other fatigue: Secondary | ICD-10-CM | POA: Diagnosis not present

## 2019-04-11 LAB — SURGICAL PATHOLOGY

## 2019-04-11 MED ORDER — KETOROLAC TROMETHAMINE 30 MG/ML IJ SOLN
INTRAMUSCULAR | Status: AC
  Filled 2019-04-11: qty 1

## 2019-04-11 MED ORDER — OXYCODONE-ACETAMINOPHEN 5-325 MG PO TABS
ORAL_TABLET | ORAL | Status: AC
  Filled 2019-04-11: qty 2

## 2019-04-11 NOTE — Discharge Summary (Signed)
Physician Discharge Summary  Patient ID: [name] MRN: [id number] DOB/AGE: July 30, 1980 39 y.o.  Admit date: 04/10/2019 Discharge date: 04/11/2019  Admission Diagnoses: Chronic pelvic pain, AUB, ovarian cyst.    Discharge Diagnoses:  Principal Problem:   Chronic pelvic pain in female Active Problems:   Endometriosis   Left ovarian cyst   Post-operative state   Abnormal uterine bleeding (AUB)   Discharged Condition: good  Hospital Course: Patient had an uncomplicated surgery; for further details of this surgery, please refer to the operative note. Furthermore, the patient had an uncomplicated postoperative course.  By time of discharge, her pain was controlled on oral pain medications; she was ambulating, voiding without difficulty, tolerating regular diet and passing flatus.  She was deemed stable for discharge to home.    Consults: None  Significant Diagnostic Studies: labs: CBC, CMP  Treatments: surgery: Robot assisted total lap hyst with bilateral salpingectomy  Discharge Exam: Blood pressure 119/70, pulse 76, temperature 98 F (36.7 C), resp. rate 16, height 5' 3.5" (1.613 m), weight 70.6 kg, last menstrual period 04/03/2019, SpO2 100 %. General appearance: alert and no distress GI: soft, non-tender; bowel sounds normal; no masses,  no organomegaly Extremities: extremities normal, atraumatic, no cyanosis or edema Incision/Wound: port sites clean and dry.   Disposition: Discharge disposition: 01-Home or Self Care       Discharge Instructions    Call MD for:  difficulty breathing, headache or visual disturbances   Complete by: As directed    Call MD for:  hives   Complete by: As directed    Call MD for:  persistant dizziness or light-headedness   Complete by: As directed    Call MD for:  persistant nausea and vomiting   Complete by: As directed    Call MD for:  redness, tenderness, or signs of infection (pain, swelling, redness, odor or green/yellow discharge  around incision site)   Complete by: As directed    Call MD for:  severe uncontrolled pain   Complete by: As directed    Call MD for:  temperature >100.4   Complete by: As directed    Diet general   Complete by: As directed    Discharge wound care:   Complete by: As directed    Keep port sites clean and dry.   Driving Restrictions   Complete by: As directed    No driving for 2 weeks   Increase activity slowly   Complete by: As directed    Lifting restrictions   Complete by: As directed    No heavy lifting or bending or stooping   Sexual Activity Restrictions   Complete by: As directed    No sexual intercourse for 8 weeks     Allergies as of 04/11/2019      Reactions   Strawberry Extract Anaphylaxis, Hives   Clindamycin Hives   angioedema   Gabapentin    Dizzy/ worse headache   Morphine And Related    Agitated and Angry   Penicillins Hives   Angioedema Did it involve swelling of the face/tongue/throat, SOB, or low BP? No Did it involve sudden or severe rash/hives, skin peeling, or any reaction on the inside of your mouth or nose? No Did you need to seek medical attention at a hospital or doctor's office? No When did it last happen?31 or 39 years old If all above answers are "NO", may proceed with cephalosporin use.   Doxycycline Rash   Sulfa Antibiotics Rash  Medication List    TAKE these medications   Fluocinolone Acetonide 0.01 % Oil APPLY 5 DROPS INTO AFFECTED EARS TWICE DAILY What changed: See the new instructions.   ibuprofen 800 MG tablet Commonly known as: ADVIL Take 1 tablet (800 mg total) by mouth every 8 (eight) hours as needed. Notes to patient: Next dose is due 1130 am as needed for pain   METHYLCOBALAMIN PO Take 5,000 mcg by mouth daily.   multivitamin with minerals Tabs tablet Take 1 tablet by mouth daily.   NEOMYCIN-POLYMYXIN-HYDROCORTISONE 1 % Soln OTIC solution Commonly known as: CORTISPORIN Place 3 drops into both ears every 6  (six) hours.   traMADol 50 MG tablet Commonly known as: ULTRAM Take 2 tablets (100 mg total) by mouth every 6 (six) hours as needed for severe pain. What changed: how much to take            Discharge Care Instructions  (From admission, onward)         Start     Ordered   04/10/19 0000  Discharge wound care:    Comments: Keep port sites clean and dry.   04/10/19 1454           Signed: Lavonia Drafts 04/11/2019, 5:12 PM

## 2019-04-11 NOTE — Anesthesia Postprocedure Evaluation (Signed)
Anesthesia Post Note  Patient: [name]  Procedure(s) Performed: XI ROBOTIC ASSISTED TOTAL HYSTERECTOMY AND SALPINGECTOMY AND RIGHT OVARIAN CYSTECTOMY (Bilateral Abdomen)     Patient location during evaluation: PACU Anesthesia Type: General Level of consciousness: awake and alert Pain management: pain level controlled Vital Signs Assessment: post-procedure vital signs reviewed and stable Respiratory status: spontaneous breathing, nonlabored ventilation and respiratory function stable Cardiovascular status: blood pressure returned to baseline and stable Postop Assessment: no apparent nausea or vomiting Anesthetic complications: no    Last Vitals:  Vitals:   04/11/19 0600 04/11/19 0815  BP: 114/64 119/70  Pulse: 77 76  Resp: 16 16  Temp: 36.9 C 36.7 C  SpO2: 100% 100%    Last Pain:  Vitals:   04/11/19 0720  TempSrc:   PainSc: [place]

## 2019-04-13 ENCOUNTER — Telehealth: Payer: Self-pay

## 2019-04-13 NOTE — Telephone Encounter (Signed)
Patient requested letter. Kathrene Alu RN

## 2019-04-13 NOTE — Telephone Encounter (Signed)
Patient needed changes to note. Kathrene Alu RN

## 2019-04-16 ENCOUNTER — Telehealth: Payer: Self-pay

## 2019-04-16 NOTE — Telephone Encounter (Signed)
Re do of letter for patient. Kathrene Alu RN

## 2019-05-02 ENCOUNTER — Other Ambulatory Visit: Payer: Self-pay

## 2019-05-02 ENCOUNTER — Ambulatory Visit (INDEPENDENT_AMBULATORY_CARE_PROVIDER_SITE_OTHER): Payer: BC Managed Care – PPO | Admitting: Obstetrics & Gynecology

## 2019-05-02 ENCOUNTER — Encounter: Payer: Self-pay | Admitting: Obstetrics & Gynecology

## 2019-05-02 VITALS — BP 109/52 | HR 92 | Ht 63.0 in | Wt 153.1 lb

## 2019-05-02 DIAGNOSIS — Z9889 Other specified postprocedural states: Secondary | ICD-10-CM

## 2019-05-02 NOTE — Patient Instructions (Signed)

## 2019-05-02 NOTE — Progress Notes (Signed)
History:  39 y.o.LMP here today for 2 week post op check.Pt is s/p RATH with bilateral salpingectomy on 04/10/2019.  Pt reports that she is doing well. She is eating and passing stols without difficulty.  She previously had back pain for years and when she awoke from surgery it was gone and has not returned. She always believed it was due to the fibroids. She feel much better overall since the surgery.   The following portions of the patient's history were reviewed and updated as appropriate: allergies, current medications, past family history, past medical history, past social history, past surgical history and problem list.  Review of Systems:  Pertinent items are noted in HPI.    Objective:  Physical Exam BP (!) 109/52   Pulse 92   Ht 5\' 3"  (1.6 m)   Wt 153 lb 1.9 oz (69.5 kg)   LMP 04/03/2019 (Exact Date)   BMI 27.12 kg/m   CONSTITUTIONAL: Well-developed, well-nourished female in no acute distress.  HENT:  Normocephalic, atraumatic EYES: Conjunctivae and EOM are normal. No scleral icterus.  NECK: Normal range of motion SKIN: Skin is warm and dry. No rash noted. Not diaphoretic.No pallor. [place]: Alert and oriented to person, place, and time. Normal coordination.  Abd: Soft, nontender and nondistended; her port sites are healing well. .  Pelvic: deferred  Labs and Imaging Surg path 04/10/2019 FINAL MICROSCOPIC DIAGNOSIS:   A. UTERUS AND BILATERAL FALLOPIAN TUBES, HYSTERECTOMY WITH BILATERAL  SALPINGECTOMY:   Uterus:  - Proliferative endometrium  - Leiomyoma (0.5 cm)  - No hyperplasia or malignancy identified   Cervix:  - Nabothian cysts  - No dysplasia or malignancy identified   Bilateral Fallopian tubes:  - Benign fallopian tubes with chronic inflammation  - No malignancy identified    GROSS DESCRIPTION:   Specimen: Received in formalin labeled uterus, cervix, bilateral  fallopian tubes  Specimen integrity: A focally disrupted uterus and cervix with  detached,  unoriented fallopian tubes  Size and shape: A symmetrical uterus measuring 9.8 cm fundus to cervix  by 5.5 cm cornu to cornu by 4.6 cm anterior to posterior  Weight: 98 g  Serosa: Tan-pink, smooth, and hyperemic. Along the anterior surface of  the specimen there is a 2.0 cm horizontal incision present on the upper  aspect of the cervix, extending into the lumen.  Cervix: Ectocervix is tan-pink, smooth, glistening, and measures 4.0 x  2.7 cm and 1.3 cm cervical os. Endocervical canal measures 3.0 cm in  length and up to 2.0 cm in diameter. The canal is tan-red and  corrugated. Cervical stroma is tan-white and fibrous.  Endometrium: Triangular endometrial cavity measures 4.6 x 3.0 cm.  Endometrium is tan-red and hyperemic. Endometrium measures up to 0.4 cm  in thickness.  Myometrium: Myometrium is tan-pink and trabeculated, with a single  tan-white, whorled intramural nodule identified measuring 0.5 cm in  greatest dimension.  Bilateral fallopian tubes: Fimbriated fallopian tubes measure 7.5 cm in  length up to 0.8 cm in diameter. The serosal surfaces are tan-pink to  purple, hyperemic, and disrupted with an embedded white plastic clips,  suggestive of previous tubal ligation. Sectioning the fallopian tubes  reveal tan-pink cut surfaces, with pinpoint lumens.  Block Summary:  10 blocks submitted  1 = anterior cervix  2 = posterior cervix  3 and 4 = anterior endomyometrium  5 and 6 = posterior endomyometrium  7-10 = Fallopian tubes  Craig Staggers 04/11/2019)   Assessment & Plan:  2 week post op check following  [place] with bilateral salpingectomy.   Doing well  Reviewed her surg path.   Reviewed post op instructions and activities  Gradual increase in activities  F/u in 4 weeks or sooner prn  Reviewed no intercourse for 8 weeks post op  All questions answered.   Arletta Lumadue L. Harraway-Smith, M.D., [name]

## 2019-05-25 ENCOUNTER — Other Ambulatory Visit: Payer: Self-pay

## 2019-05-25 ENCOUNTER — Ambulatory Visit (INDEPENDENT_AMBULATORY_CARE_PROVIDER_SITE_OTHER): Payer: BC Managed Care – PPO | Admitting: Obstetrics & Gynecology

## 2019-05-25 ENCOUNTER — Encounter: Payer: Self-pay | Admitting: Obstetrics & Gynecology

## 2019-05-25 VITALS — BP 128/85 | HR 81 | Ht 63.0 in | Wt 156.0 lb

## 2019-05-25 DIAGNOSIS — Z9889 Other specified postprocedural states: Secondary | ICD-10-CM

## 2019-05-25 NOTE — Progress Notes (Signed)
History:  39 y.o.LMP here today for 6 week post op check.Pt is s/p RATH with bilateral salpingectomy on 04/10/2019.  Pt reports that she is doing well. She is eating and passing stols without difficulty.     The following portions of the patient's history were reviewed and updated as appropriate: allergies, current medications, past family history, past medical history, past social history, past surgical history and problem list.  Review of Systems:  Pertinent items are noted in HPI.    Objective:  Physical Exam BP 128/85   Pulse 81   Ht 5\' 3"  (1.6 m)   Wt 156 lb (70.8 kg)   LMP 04/03/2019 (Exact Date)   BMI 27.63 kg/m   CONSTITUTIONAL: Well-developed, well-nourished female in no acute distress.  HENT:  Normocephalic, atraumatic EYES: Conjunctivae and EOM are normal. No scleral icterus.  NECK: Normal range of motion SKIN: Skin is warm and dry. No rash noted. Not diaphoretic.No pallor. [place]: Alert and oriented to person, place, and time. Normal coordination.  Abd: Soft, nontender and nondistended; her port sites are healing well. .  Pelvic: deferred  Labs and Imaging Surg path 04/10/2019 FINAL MICROSCOPIC DIAGNOSIS:   A. UTERUS AND BILATERAL FALLOPIAN TUBES, HYSTERECTOMY WITH BILATERAL  SALPINGECTOMY:   Uterus:  - Proliferative endometrium  - Leiomyoma (0.5 cm)  - No hyperplasia or malignancy identified   Cervix:  - Nabothian cysts  - No dysplasia or malignancy identified   Bilateral Fallopian tubes:  - Benign fallopian tubes with chronic inflammation  - No malignancy identified    Assessment & Plan:  6 week post op check following RATH with bilateral salpingectomy.   Doing well  Reviewed her surg path.   Reviewed post op instructions and activities  Gradual increase in activities  F/u in 3 months or sooner prn  Reviewed no intercourse for 8 weeks post op  All questions answered.   Evelia Waskey L. Harraway-Smith, M.D., [name]

## 2019-05-31 ENCOUNTER — Telehealth: Payer: Self-pay | Admitting: Family Medicine

## 2019-05-31 DIAGNOSIS — E538 Deficiency of other specified B group vitamins: Secondary | ICD-10-CM

## 2019-05-31 NOTE — Telephone Encounter (Signed)
-----   Message from Cloyd Stagers, RT sent at 05/18/2019  2:11 PM EDT ----- Regarding: Lab Orders for Friday 5.7.2021 Please place lab orders for Friday 5.7.2021, appt notes state "check B12"Thank you, Dyke Maes RT(R)

## 2019-06-01 ENCOUNTER — Encounter: Payer: Self-pay | Admitting: Family Medicine

## 2019-06-01 ENCOUNTER — Encounter: Payer: Self-pay | Admitting: Obstetrics & Gynecology

## 2019-06-01 ENCOUNTER — Other Ambulatory Visit (INDEPENDENT_AMBULATORY_CARE_PROVIDER_SITE_OTHER): Payer: BC Managed Care – PPO

## 2019-06-01 DIAGNOSIS — E538 Deficiency of other specified B group vitamins: Secondary | ICD-10-CM | POA: Diagnosis not present

## 2019-06-01 NOTE — Addendum Note (Signed)
Addended by: Ellamae Sia on: 06/01/2019 03:52 PM   Modules accepted: Orders

## 2019-06-02 LAB — VITAMIN B12: Vitamin B-12: 395 pg/mL (ref 200–1100)

## 2019-06-12 ENCOUNTER — Encounter: Payer: Self-pay | Admitting: Family Medicine

## 2019-06-27 ENCOUNTER — Ambulatory Visit: Payer: BC Managed Care – PPO | Admitting: Family Medicine

## 2019-06-27 DIAGNOSIS — Z0289 Encounter for other administrative examinations: Secondary | ICD-10-CM

## 2019-11-01 ENCOUNTER — Other Ambulatory Visit: Payer: Self-pay | Admitting: Family Medicine

## 2019-11-01 DIAGNOSIS — Z1231 Encounter for screening mammogram for malignant neoplasm of breast: Secondary | ICD-10-CM

## 2020-01-04 ENCOUNTER — Other Ambulatory Visit: Payer: Self-pay

## 2020-01-04 ENCOUNTER — Ambulatory Visit (INDEPENDENT_AMBULATORY_CARE_PROVIDER_SITE_OTHER): Payer: 59 | Admitting: Family Medicine

## 2020-01-04 ENCOUNTER — Encounter: Payer: Self-pay | Admitting: Family Medicine

## 2020-01-04 VITALS — BP 124/70 | HR 70 | Temp 97.6°F | Resp 16 | Ht 63.0 in | Wt 167.0 lb

## 2020-01-04 DIAGNOSIS — R3989 Other symptoms and signs involving the genitourinary system: Secondary | ICD-10-CM | POA: Diagnosis not present

## 2020-01-04 DIAGNOSIS — R350 Frequency of micturition: Secondary | ICD-10-CM | POA: Diagnosis not present

## 2020-01-04 DIAGNOSIS — R3915 Urgency of urination: Secondary | ICD-10-CM | POA: Diagnosis not present

## 2020-01-04 LAB — POCT URINALYSIS DIPSTICK
Bilirubin, UA: NEGATIVE
Blood, UA: NEGATIVE
Glucose, UA: NEGATIVE
Ketones, UA: NEGATIVE
Leukocytes, UA: NEGATIVE
Nitrite, UA: NEGATIVE
Protein, UA: POSITIVE — AB
Spec Grav, UA: 1.015 (ref 1.010–1.025)
Urobilinogen, UA: NEGATIVE E.U./dL — AB
pH, UA: 7 (ref 5.0–8.0)

## 2020-01-04 LAB — POCT UA - MICROSCOPIC ONLY: RBC, urine, microscopic: 0

## 2020-01-04 NOTE — Assessment & Plan Note (Signed)
With urgency and frequency  Drinks just water  ua -tr protein and urobilinogen Sent for cx also  Reassuring exam  S/p partial hyst-pt asks about scar tissue Unable to get [facility] [place]  Has also seen Dr Kennon Rounds in past  No vaginal sympotms at all

## 2020-01-04 NOTE — Progress Notes (Signed)
Subjective:    Patient ID: [name], female    DOB: 01-29-1980, 39 y.o.   MRN: [id number]  This visit occurred during the SARS-CoV-2 public health emergency.  Safety protocols were in place, including screening questions prior to the visit, additional usage of staff PPE, and extensive cleaning of exam room while observing appropriate contact time as indicated for disinfecting solutions.    HPI Pt presents with urinary symptoms   Wt Readings from Last 3 Encounters:  01/04/20 167 lb (75.8 kg)  05/25/19 156 lb (70.8 kg)  05/02/19 153 lb 1.9 oz (69.5 kg)   29.58 kg/m   Low abdomen pressure and pain  Some urgency  A little leaking since her hysterectomy  Frequency of urination  About a month- worse this week  Sore suprapubic (had hysterectomy) -center of bladder area  No blood in urine  No burning to urinate  No nausea or fever  A little sore in flank bilat   H/o kidney stone in the paste   No vaginal itch or d/c No exp to std   hyst was parties   Drinks water  Avoids caffeine  Avoids juice   ua - slt protein  Results for orders placed or performed in visit on 01/04/20  POCT urinalysis dipstick  Result Value Ref Range   Color, UA     Clarity, UA     Glucose, UA Negative Negative   Bilirubin, UA negative    Ketones, UA negative    Spec Grav, UA 1.015 1.010 - 1.025   Blood, UA negative    pH, UA 7.0 5.0 - 8.0   Protein, UA Positive (A) Negative   Urobilinogen, UA negative (A) 0.2 or 1.0 E.U./dL   Nitrite, UA negative    Leukocytes, UA Negative Negative   Appearance     Odor    POCT UA - Microscopic Only  Result Value Ref Range   WBC, Ur, HPF, POC 0-1    RBC, urine, microscopic 0    Bacteria, U Microscopic mod    Mucus, UA few    Epithelial cells, urine per micros few    Crystals, Ur, HPF, POC few    Casts, Ur, LPF, POC none    Yeast, UA none      [facility] [place]  ? Doctor still there  Has seen Dr Kennon Rounds in the past also     Review of Systems  Constitutional: Positive for fatigue. Negative for activity change, appetite change and fever.  HENT: Negative for congestion and sore throat.   Eyes: Negative for itching and visual disturbance.  Respiratory: Negative for cough and shortness of breath.   Cardiovascular: Negative for leg swelling.  Gastrointestinal: Negative for abdominal distention, abdominal pain, constipation, diarrhea and nausea.  Endocrine: Negative for cold intolerance and polydipsia.  Genitourinary: Positive for frequency and urgency. Negative for decreased urine volume, difficulty urinating, dysuria, flank pain, hematuria, vaginal bleeding, vaginal discharge and vaginal pain.  Musculoskeletal: Negative for myalgias.  Skin: Negative for rash.  Allergic/Immunologic: Negative for immunocompromised state.  Neurological: Negative for dizziness and weakness.  Hematological: Negative for adenopathy.       Objective:   Physical Exam Constitutional:      General: She is not in acute distress.    Appearance: Normal appearance. She is well-developed and well-nourished.     Comments: overwt  HENT:     Head: Normocephalic and atraumatic.     Mouth/Throat:     Mouth:  Mucous membranes are moist.  Eyes:     General: No scleral icterus.    Extraocular Movements: EOM normal.     Conjunctiva/sclera: Conjunctivae normal.     Pupils: Pupils are equal, round, and reactive to light.  Cardiovascular:     Rate and Rhythm: Normal rate and regular rhythm.     Heart sounds: Normal heart sounds.  Pulmonary:     Effort: Pulmonary effort is normal.     Breath sounds: Normal breath sounds.  Abdominal:     General: Bowel sounds are normal. There is no distension.     Palpations: Abdomen is soft.     Tenderness: There is abdominal tenderness. There is no rebound.     Comments: No cva tenderness  Mild suprapubic tenderness  Musculoskeletal:        General: No edema.     Cervical back: Normal range of  motion and neck supple.  Lymphadenopathy:     Cervical: No cervical adenopathy.  Skin:    Coloration: Skin is not pale.     Findings: No erythema or rash.  Neurological:     Mental Status: She is alert.  Psychiatric:        Mood and Affect: Mood and affect and mood normal.           Assessment & Plan:   Problem List Items Addressed This Visit      Other   Urine frequency - Primary    Unsure if this began after hysterectomy but worse now  Also urgency Discomfort over bladder this week as well  No significant symptoms of kidney stone and no blood Urine fairly clear (tr protein) Sent for culture  ? Overactive bladder , if so may be a candidate for medication       Relevant Orders   Urine Culture   Bladder pain    With urgency and frequency  Drinks just water  ua -tr protein and urobilinogen Sent for cx also  Reassuring exam  S/p partial hyst-pt asks about scar tissue Unable to get through to [facility] [place]  Has also seen Dr Kennon Rounds in past  No vaginal sympotms at all        Relevant Orders   Urine Culture    Other Visit Diagnoses    Urinary urgency       Relevant Orders   POCT urinalysis dipstick (Completed)   Urine Culture

## 2020-01-04 NOTE — Patient Instructions (Addendum)
Keep drinking water  I will sent urine for a culture   Watch for flank pain on one side or blood in urine   We will contact you with culture result and make a plan from there    We may have you check in with gyn if culture is negative

## 2020-01-04 NOTE — Assessment & Plan Note (Signed)
Unsure if this began after hysterectomy but worse now  Also urgency Discomfort over bladder this week as well  No significant symptoms of kidney stone and no blood Urine fairly clear (tr protein) Sent for culture  ? Overactive bladder , if so may be a candidate for medication

## 2020-01-05 LAB — URINE CULTURE
MICRO NUMBER:: 11303415
SPECIMEN QUALITY:: ADEQUATE

## 2020-01-07 ENCOUNTER — Encounter: Payer: Self-pay | Admitting: Family Medicine

## 2020-01-14 ENCOUNTER — Other Ambulatory Visit: Payer: Self-pay

## 2020-01-14 ENCOUNTER — Emergency Department (HOSPITAL_BASED_OUTPATIENT_CLINIC_OR_DEPARTMENT_OTHER): Payer: Commercial Managed Care - PPO

## 2020-01-14 ENCOUNTER — Encounter: Payer: Self-pay | Admitting: Emergency Medicine

## 2020-01-14 ENCOUNTER — Telehealth: Payer: Self-pay

## 2020-01-14 ENCOUNTER — Emergency Department: Admit: 2020-01-14 | Discharge status: Absent without leave | Payer: Self-pay

## 2020-01-14 ENCOUNTER — Emergency Department (INDEPENDENT_AMBULATORY_CARE_PROVIDER_SITE_OTHER)
Admission: EM | Admit: 2020-01-14 | Discharge: 2020-01-14 | Disposition: A | Discharge status: Home / Self Care | Payer: Commercial Managed Care - PPO | Source: Home / Self Care

## 2020-01-14 ENCOUNTER — Emergency Department (INDEPENDENT_AMBULATORY_CARE_PROVIDER_SITE_OTHER): Payer: Commercial Managed Care - PPO

## 2020-01-14 DIAGNOSIS — R160 Hepatomegaly, not elsewhere classified: Secondary | ICD-10-CM

## 2020-01-14 DIAGNOSIS — R1010 Upper abdominal pain, unspecified: Secondary | ICD-10-CM

## 2020-01-14 DIAGNOSIS — R1030 Lower abdominal pain, unspecified: Secondary | ICD-10-CM

## 2020-01-14 DIAGNOSIS — R233 Spontaneous ecchymoses: Secondary | ICD-10-CM

## 2020-01-14 DIAGNOSIS — T148XXA Other injury of unspecified body region, initial encounter: Secondary | ICD-10-CM

## 2020-01-14 LAB — POCT CBC W AUTO DIFF (K'VILLE URGENT CARE)

## 2020-01-14 LAB — POCT URINALYSIS DIP (MANUAL ENTRY)
Bilirubin, UA: NEGATIVE
Blood, UA: NEGATIVE
Glucose, UA: NEGATIVE mg/dL
Ketones, POC UA: NEGATIVE mg/dL
Leukocytes, UA: NEGATIVE
Nitrite, UA: NEGATIVE
Protein Ur, POC: NEGATIVE mg/dL
Spec Grav, UA: 1.015 (ref 1.010–1.025)
Urobilinogen, UA: 0.2 E.U./dL
pH, UA: 8.5 — AB (ref 5.0–8.0)

## 2020-01-14 LAB — I-STAT CREATININE (MANUAL ENTRY): Creatinine, Ser: 0.6 (ref 0.50–1.10)

## 2020-01-14 MED ORDER — IOHEXOL 300 MG/ML  SOLN
100.0000 mL | Freq: Once | INTRAMUSCULAR | Status: AC | PRN
  Administered 2020-01-14: 100 mL via INTRAVENOUS

## 2020-01-14 NOTE — Discharge Instructions (Signed)
  Please call your primary care provider tomorrow to schedule follow up exam. Additional labs/imaging is recommended based on radiology report this evening. Your blood work should come back later tonight/tomorrow, which can help your primary care provider in continuing the workup of your symptoms.  In the meantime, be sure to stay well hydrated, try to stick with a bland diet while having abdominal pain.   Call 911 or have someone drive you to the hospital if symptoms significantly worsening- severe pain, black or bright red blood in stool, blood in vomit ("coffee grounds" or bright red), chest pain, headache, dizziness/lightheadedness, or other new concerning symptoms develop.

## 2020-01-14 NOTE — ED Triage Notes (Signed)
Pt noted bruises to her arms, trunk, and abdomen since Friday  Pt feels dizzy on and off Pt had a hysterectomy in March 2021- pt was anemic at the time Generalized weakness NO FLU or COVID vaccine

## 2020-01-14 NOTE — Telephone Encounter (Signed)
Pt sent a mychart msg requesting to schedule an apt for f/u following a VV with her employers health insurance. She sent the msg to the front office. [place] office brought this to this nurse for assistance. Advised to send pt a msg that this nurse would f/u with her after talking to PCP. PCP is out of the office this afternoon. Contacted pt who reports a bruise on her stomach 3 days ago and today having bruises over her entire body. She also feels bruised inside her body. She had a VV today and they advised she be seen in person. She requested an apt here but no apts were available so she is at the [facility] in [place]. Advised a msg would be sent to PCP so she is aware. Advised to f/u with clinic to let PCP know how she is doing after UC visit. Pt appreciative and verbalized understanding.

## 2020-01-14 NOTE — ED Provider Notes (Signed)
[name]    CSN: [id number] Arrival date & time: 01/14/20  1458      History   Chief Complaint Chief Complaint  Patient presents with  . Bleeding/Bruising  . Abdominal Pain  . Weakness    HPI [name] is a 39 y.o. female.   HPI [name] is a 39 y.o. female presenting to UC with c/o bruising to her arms, Right mid back, abdomen and upper legs for 3 days, associated upper and lower abdominal pain that is a dull ache. Today, pt states her upper abdomen "feels bruised."  She has been taking miralax for constipation as recommended by PCP but denies relief.  She had a UA last week with PCP for urinary frequency and bladder pain, which was unremarkable.  Pt also c/o generalized weakness. Reports hx of anemia, had a hysterectomy March 2021 but has not had labs checked since then.  Denies fever, chills, n/v/d.  No known bleeding/clotting problems for her or her family but states brother died of kidney disease and father has liver disease, Wilson's.    Past Medical History:  Diagnosis Date  . ADD (attention deficit disorder)   . Complication of anesthesia   . Endometriosis   . Family history of malignant neoplasm of gastrointestinal tract   . Fatigue   . Fibromyalgia   . GERD (gastroesophageal reflux disease)   . Kidney stone   . Migraine   . Personal history of colonic polyps 06/01/2006   sessil serrated adenoma  . PONV (postoperative nausea and vomiting)   . Vitamin B12 deficiency     Patient Active Problem List   Diagnosis Date Noted  . Urine frequency 01/04/2020  . Bladder pain 01/04/2020  . Post-operative state 04/10/2019  . Abnormal uterine bleeding (AUB) 04/10/2019  . Elevated blood pressure reading 01/24/2019  . Screening mammogram, encounter for 10/18/2018  . Vitamin D deficiency 08/22/2018  . Encounter for routine gynecological examination 02/02/2017  . Paresthesia of arm 10/13/2016  . Eczema 01/29/2015  . Health maintenance  examination 05/16/2014  . Paresthesias 05/16/2014  . Anxiety disorder 03/19/2013  . Left ovarian cyst 03/07/2013  . Chronic pelvic pain in female 01/09/2013  . Heavy menses 01/03/2013  . ADD (attention deficit disorder) 02/29/2012  . Fibromyalgia 03/26/2011  . Myofascial pain 03/19/2011  . Gluten intolerance 01/22/2011  . Overweight (BMI 25.0-29.9) 11/09/2010  . Fatigue 05/18/2010  . FIBROCYSTIC BREAST DISEASE 03/27/2010  . CAVERNOUS HEMANGIOMA, LIVER 12/03/2009  . PERSONAL HISTORY OF COLONIC POLYPS 12/03/2009  . ALLERGIC RHINITIS 03/09/2008  . URINARY FREQUENCY, CHRONIC 01/31/2007  . Vitamin B12 deficiency 10/28/2006  . GERD 04/27/2006  . Endometriosis 04/27/2006  . RENAL CALCULUS, HX OF 04/27/2006  . MIGRAINES, HX OF 04/27/2006    Past Surgical History:  Procedure Laterality Date  . BREAST SURGERY  2004   breast reduction  . CHOLECYSTECTOMY    . LAPAROSCOPY  11/2004   endometriosis  . OVARIAN CYST SURGERY    . REDUCTION MAMMAPLASTY    . ROBOTIC ASSISTED TOTAL HYSTERECTOMY Bilateral 04/10/2019   Procedure: XI ROBOTIC ASSISTED TOTAL HYSTERECTOMY AND SALPINGECTOMY AND RIGHT OVARIAN CYSTECTOMY;  Surgeon: Lavonia Drafts, MD;  Location: [facility];  Service: Gynecology;  Laterality: Bilateral;  . TUBAL LIGATION  2008  . WISDOM TOOTH EXTRACTION     with four other teeth as well    OB History    Gravida  2   Para  2   Term  1   Preterm  1   AB      Living  2     SAB      IAB      Ectopic      Multiple      Live Births               Home Medications    Prior to Admission medications   Medication Sig Start Date End Date Taking? Authorizing Provider  Cyanocobalamin (B-12) 2500 MCG TABS Take by mouth.    [provider]  magnesium gluconate (MAGONATE) 500 MG tablet Take 5,000 mg by mouth once a week.    [provider]  VITAMIN D PO Take 10,000 Units by mouth once a week.    [provider]     Family History Family History  Problem Relation Age of Onset  . Cervical cancer Mother 35  . Hypertension Mother   . Diabetes Mother   . Kidney cancer Mother 33  . Cancer Mother        Melanoma  . Kidney failure Brother 86       s/p transplant ?due to PCKD or renal stones  . Wilson's disease Father        liver transplant  . Ulcerative colitis Father   . Liver disease Father   . Colon polyps Sister   . Breast cancer Maternal Aunt   . Breast cancer Maternal Grandmother   . Colon cancer Paternal Grandfather   . Esophageal cancer Neg Hx   . Stomach cancer Neg Hx   . Rectal cancer Neg Hx     Social History Social History   Tobacco Use  . Smoking status: Former Smoker    Packs/day: 0.25    Quit date: 01/25/2005    Years since quitting: 14.9  . Smokeless tobacco: Never Used  Vaping Use  . Vaping Use: Never used  Substance Use Topics  . Alcohol use: Yes    Comment: rare  . Drug use: No     Allergies   Strawberry extract, Clindamycin, Gabapentin, Morphine and related, Penicillins, Doxycycline, and Sulfa antibiotics   Review of Systems Review of Systems  Constitutional: Positive for fatigue. Negative for chills and fever.  HENT: Negative for congestion, ear pain, sore throat, trouble swallowing and voice change.   Respiratory: Negative for cough and shortness of breath.   Cardiovascular: Negative for chest pain and palpitations.  Gastrointestinal: Positive for abdominal pain. Negative for diarrhea, nausea and vomiting.  Genitourinary: Negative for dysuria and frequency.  Musculoskeletal: Negative for arthralgias, back pain and myalgias.  Skin: Positive for color change. Negative for rash.  Neurological: Positive for weakness.  All other systems reviewed and are negative.    Physical Exam Triage Vital Signs ED Triage Vitals  Enc Vitals Group     BP      Pulse      Resp      Temp      Temp src      SpO2      Weight      Height      Head Circumference       Peak Flow      Pain Score      Pain Loc      Pain Edu?      Excl. in [place]?    No data found.  Updated Vital Signs BP 138/82 (BP Location: Right Arm)   Pulse 67   Temp 98.6 F (37 C) (Oral)   Resp 15  Ht 5\' 3"  (1.6 m)   Wt 157 lb (71.2 kg)   LMP 04/03/2019 (Exact Date)   SpO2 99%   BMI 27.81 kg/m   Visual Acuity Right Eye Distance:   Left Eye Distance:   Bilateral Distance:    Right Eye Near:   Left Eye Near:    Bilateral Near:     Physical Exam Vitals and nursing note reviewed.  Constitutional:      General: She is not in acute distress.    Appearance: She is well-developed and well-nourished. She is not ill-appearing, toxic-appearing or diaphoretic.  HENT:     Head: Normocephalic and atraumatic.  Eyes:     General: No scleral icterus.    Extraocular Movements: EOM normal.  Cardiovascular:     Rate and Rhythm: Normal rate and regular rhythm.  Pulmonary:     Effort: Pulmonary effort is normal. No respiratory distress.     Breath sounds: Normal breath sounds.  Abdominal:     General: There is no distension.     Palpations: Abdomen is soft.     Tenderness: There is abdominal tenderness in the right upper quadrant, right lower quadrant, epigastric area, suprapubic area, left upper quadrant and left lower quadrant. There is no right CVA tenderness, left CVA tenderness, guarding or rebound. Negative signs include Murphy's sign, Rovsing's sign and McBurney's sign.  Musculoskeletal:        General: Normal range of motion.     Cervical back: Normal range of motion.  Skin:    General: Skin is warm and dry.  Neurological:     Mental Status: She is alert and oriented to person, place, and time.  Psychiatric:        Mood and Affect: Mood and affect normal.        Behavior: Behavior normal.      UC Treatments / Results  Labs (all labs ordered are listed, but only abnormal results are displayed) Labs Reviewed  POCT URINALYSIS DIP (MANUAL ENTRY) - Abnormal;  Notable for the following components:      Result Value   pH, UA 8.5 (*)    All other components within normal limits  COMPLETE METABOLIC PANEL WITH GFR  LIPASE  APTT  PROTIME-INR  POCT CBC W AUTO DIFF ([facility])  POCT CBC W AUTO DIFF ([facility])    EKG   Radiology CT ABDOMEN PELVIS W CONTRAST  Result Date: 01/14/2020 CLINICAL DATA:  39 year old female with recent hysterectomy presenting with spontaneous bruising to the abdomen. EXAM: CT ABDOMEN AND PELVIS WITH CONTRAST TECHNIQUE: Multidetector CT imaging of the abdomen and pelvis was performed using the standard protocol following bolus administration of intravenous contrast. CONTRAST:  110mL OMNIPAQUE IOHEXOL 300 MG/ML  SOLN COMPARISON:  CT abdomen pelvis dated 12/06/2013. FINDINGS: Lower chest: The visualized lung bases are clear. No intra-abdominal free air or free fluid. Hepatobiliary: Multiple hepatic hypodense lesions are not characterized but appear to have been present on the CT of 03/06/2013 however slightly increased in the interim. One the lesion demonstrates nodular enhancement. These likely represent hemangioma. MRI without and with contrast may provide better characterization. There is no intrahepatic biliary ductal dilatation. The gallbladder is surgically absent. Pancreas: Unremarkable. No pancreatic ductal dilatation or surrounding inflammatory changes. Spleen: Normal in size without focal abnormality. Adrenals/Urinary Tract: The adrenal glands unremarkable. There is no hydronephrosis on either side. There is symmetric enhancement and excretion of contrast by both kidneys. The visualized ureters and urinary bladder appear unremarkable. Stomach/Bowel: There  is no bowel obstruction or active inflammation. The appendix is normal. Vascular/Lymphatic: The abdominal aorta and IVC unremarkable. No portal venous gas. There is no adenopathy. Reproductive: Hysterectomy. There is an 18 mm hypodense lesion in the left  ovary, likely a dominant follicle or cyst. The right ovary is unremarkable. Other: None Musculoskeletal: No acute or significant osseous findings. IMPRESSION: 1. No acute intra-abdominal or pelvic pathology. No bowel obstruction. Normal appendix. 2. Indeterminate hepatic hypodense lesions, likely hemangioma. MRI without and with contrast may provide better characterization. 3. Hysterectomy. Electronically Signed   By: Anner Crete M.D.   On: 01/14/2020 17:18    Procedures Procedures (including critical care time)  Medications Ordered in UC Medications - No data to display  Initial Impression / Assessment and Plan / UC Course  I have reviewed the triage vital signs and the nursing notes.  Pertinent labs & imaging results that were available during my care of the patient were reviewed by me and considered in my medical decision making (see chart for details).     No acute findings Encouraged f/u with PCP  Discussed symptoms that warrant emergent care in the ED.   Final Clinical Impressions(s) / UC Diagnoses   Final diagnoses:  Bruising  Upper abdominal pain  Lower abdominal pain  Liver masses     Discharge Instructions      Please call your primary care provider tomorrow to schedule follow up exam. Additional labs/imaging is recommended based on radiology report this evening. Your blood work should come back later tonight/tomorrow, which can help your primary care provider in continuing the workup of your symptoms.  In the meantime, be sure to stay well hydrated, try to stick with a bland diet while having abdominal pain.   Call 911 or have someone drive you to the hospital if symptoms significantly worsening- severe pain, black or bright red blood in stool, blood in vomit ("coffee grounds" or bright red), chest pain, headache, dizziness/lightheadedness, or other new concerning symptoms develop.      ED Prescriptions    None     PDMP not reviewed this encounter.    Noe Gens, [place] 01/14/20 2049

## 2020-01-15 ENCOUNTER — Telehealth: Payer: Self-pay | Admitting: Family Medicine

## 2020-01-15 DIAGNOSIS — K769 Liver disease, unspecified: Secondary | ICD-10-CM

## 2020-01-15 DIAGNOSIS — D1803 Hemangioma of intra-abdominal structures: Secondary | ICD-10-CM

## 2020-01-15 LAB — COMPLETE METABOLIC PANEL WITH GFR
AG Ratio: 1.7 (calc) (ref 1.0–2.5)
ALT: 11 U/L (ref 6–29)
AST: 19 U/L (ref 10–30)
Albumin: 4.5 g/dL (ref 3.6–5.1)
Alkaline phosphatase (APISO): 44 U/L (ref 31–125)
BUN/Creatinine Ratio: 10 (calc) (ref 6–22)
BUN: 6 mg/dL — ABNORMAL LOW (ref 7–25)
CO2: 24 mmol/L (ref 20–32)
Calcium: 9.4 mg/dL (ref 8.6–10.2)
Chloride: 105 mmol/L (ref 98–110)
Creat: 0.61 mg/dL (ref 0.50–1.10)
GFR, Est African American: 131 mL/min/{1.73_m2} (ref >=60)
GFR, Est Non African American: 113 mL/min/{1.73_m2} (ref >=60)
Globulin: 2.7 g/dL (calc) (ref 1.9–3.7)
Glucose, Bld: 98 mg/dL (ref 65–99)
Potassium: 3.8 mmol/L (ref 3.5–5.3)
Sodium: 139 mmol/L (ref 135–146)
Total Bilirubin: 0.6 mg/dL (ref 0.2–1.2)
Total Protein: 7.2 g/dL (ref 6.1–8.1)

## 2020-01-15 LAB — APTT: aPTT: 27 s (ref 23–32)

## 2020-01-15 LAB — LIPASE: Lipase: 20 U/L (ref 7–60)

## 2020-01-15 LAB — PROTIME-INR
INR: 1
Prothrombin Time: 10.4 s (ref 9.0–11.5)

## 2020-01-15 NOTE — Telephone Encounter (Signed)
I ordered MRI to look at liver lesions Let her know office will call her to schedule

## 2020-01-16 ENCOUNTER — Encounter: Payer: Self-pay | Admitting: *Deleted

## 2020-01-16 ENCOUNTER — Ambulatory Visit: Payer: Commercial Managed Care - PPO | Admitting: Family Medicine

## 2020-01-16 NOTE — Telephone Encounter (Signed)
Sent mychart message letting pt know 

## 2020-01-22 ENCOUNTER — Telehealth: Payer: Self-pay

## 2020-01-22 DIAGNOSIS — K769 Liver disease, unspecified: Secondary | ICD-10-CM

## 2020-01-22 NOTE — Telephone Encounter (Signed)
Dr Milinda Antis, for MRI scan for this patient her [facility] insurance did not need pre authorization but [facility] did. I called this morning to follow up on the request and was advised this was denied. Peer to peer option is available([phone]). I did send clinical notes and all the information for this request when I submitted originally.  Peer to peer has to be schedule. Can you give me some times you would be available for them to call and speak with you? Thank you  CPT [phone] Member ID: [id number]

## 2020-01-23 NOTE — Telephone Encounter (Signed)
Sorry, I am unexpectedly out of the office today.  The paperwork sent to me indicates that she has to have an ultrasound first before MRI is covered   (that is fine and perhaps this will visualize what we want to see well enough to not need MRI) I will order that  Does she pref [place] or [place]?

## 2020-01-23 NOTE — Telephone Encounter (Signed)
I put in the order, thanks  

## 2020-01-23 NOTE — Telephone Encounter (Signed)
Pt prefers [name]

## 2020-01-24 ENCOUNTER — Telehealth: Payer: Commercial Managed Care - PPO | Admitting: Family Medicine

## 2020-01-28 ENCOUNTER — Telehealth: Payer: Commercial Managed Care - PPO | Admitting: Family Medicine

## 2020-01-30 ENCOUNTER — Encounter: Payer: Self-pay | Admitting: Family Medicine

## 2020-01-30 DIAGNOSIS — E559 Vitamin D deficiency, unspecified: Secondary | ICD-10-CM

## 2020-01-30 DIAGNOSIS — T148XXA Other injury of unspecified body region, initial encounter: Secondary | ICD-10-CM | POA: Insufficient documentation

## 2020-01-30 DIAGNOSIS — R5382 Chronic fatigue, unspecified: Secondary | ICD-10-CM

## 2020-01-30 DIAGNOSIS — Z0000 Encounter for general adult medical examination without abnormal findings: Secondary | ICD-10-CM

## 2020-01-30 DIAGNOSIS — E538 Deficiency of other specified B group vitamins: Secondary | ICD-10-CM

## 2020-01-31 ENCOUNTER — Ambulatory Visit: Payer: Self-pay

## 2020-01-31 ENCOUNTER — Other Ambulatory Visit: Payer: Self-pay | Admitting: Family Medicine

## 2020-01-31 DIAGNOSIS — E559 Vitamin D deficiency, unspecified: Secondary | ICD-10-CM

## 2020-01-31 DIAGNOSIS — T148XXA Other injury of unspecified body region, initial encounter: Secondary | ICD-10-CM

## 2020-01-31 DIAGNOSIS — R5382 Chronic fatigue, unspecified: Secondary | ICD-10-CM

## 2020-02-03 ENCOUNTER — Telehealth: Payer: Self-pay | Admitting: Family Medicine

## 2020-02-03 DIAGNOSIS — E559 Vitamin D deficiency, unspecified: Secondary | ICD-10-CM

## 2020-02-03 DIAGNOSIS — Z0000 Encounter for general adult medical examination without abnormal findings: Secondary | ICD-10-CM

## 2020-02-03 DIAGNOSIS — E538 Deficiency of other specified B group vitamins: Secondary | ICD-10-CM

## 2020-02-03 NOTE — Telephone Encounter (Signed)
-----   Message from Cloyd Stagers, RT sent at 01/21/2020  1:40 PM EST ----- Regarding: Lab Orders for Monday 1.10.2022 Please place lab orders for Monday 1.10.2022, office visit for physical on Monday 1.17.2022 Thank you, Dyke Maes RT(R)

## 2020-02-04 ENCOUNTER — Other Ambulatory Visit (INDEPENDENT_AMBULATORY_CARE_PROVIDER_SITE_OTHER): Payer: Commercial Managed Care - PPO

## 2020-02-04 ENCOUNTER — Other Ambulatory Visit: Payer: Self-pay

## 2020-02-04 DIAGNOSIS — T148XXA Other injury of unspecified body region, initial encounter: Secondary | ICD-10-CM | POA: Diagnosis not present

## 2020-02-04 DIAGNOSIS — E559 Vitamin D deficiency, unspecified: Secondary | ICD-10-CM

## 2020-02-04 DIAGNOSIS — Z0000 Encounter for general adult medical examination without abnormal findings: Secondary | ICD-10-CM

## 2020-02-04 DIAGNOSIS — R5382 Chronic fatigue, unspecified: Secondary | ICD-10-CM

## 2020-02-04 DIAGNOSIS — E538 Deficiency of other specified B group vitamins: Secondary | ICD-10-CM

## 2020-02-04 LAB — COMPREHENSIVE METABOLIC PANEL
ALT: 10 U/L (ref 0–35)
AST: 12 U/L (ref 0–37)
Albumin: 4.4 g/dL (ref 3.5–5.2)
Alkaline Phosphatase: 35 U/L — ABNORMAL LOW (ref 39–117)
BUN: 8 mg/dL (ref 6–23)
CO2: 28 mEq/L (ref 19–32)
Calcium: 9 mg/dL (ref 8.4–10.5)
Chloride: 104 mEq/L (ref 96–112)
Creatinine, Ser: 0.56 mg/dL (ref 0.40–1.20)
GFR: 112.95 mL/min (ref >60.00)
Glucose, Bld: 82 mg/dL (ref 70–99)
Potassium: 4.2 mEq/L (ref 3.5–5.1)
Sodium: 138 mEq/L (ref 135–145)
Total Bilirubin: 0.5 mg/dL (ref 0.2–1.2)
Total Protein: 6.8 g/dL (ref 6.0–8.3)

## 2020-02-04 LAB — VITAMIN D 25 HYDROXY (VIT D DEFICIENCY, FRACTURES): VITD: 40.41 ng/mL (ref 30.00–100.00)

## 2020-02-04 LAB — CBC WITH DIFFERENTIAL/PLATELET
Basophils Absolute: 0.1 10*3/uL (ref 0.0–0.1)
Basophils Relative: 1.2 % (ref 0.0–3.0)
Eosinophils Absolute: 0.1 10*3/uL (ref 0.0–0.7)
Eosinophils Relative: 2.7 % (ref 0.0–5.0)
HCT: 36.7 % (ref 36.0–46.0)
Hemoglobin: 12.2 g/dL (ref 12.0–15.0)
Lymphocytes Relative: 31.2 % (ref 12.0–46.0)
Lymphs Abs: 1.4 10*3/uL (ref 0.7–4.0)
MCHC: 33.1 g/dL (ref 30.0–36.0)
MCV: 90.8 fl (ref 78.0–100.0)
Monocytes Absolute: 0.5 10*3/uL (ref 0.1–1.0)
Monocytes Relative: 10.8 % (ref 3.0–12.0)
Neutro Abs: 2.3 10*3/uL (ref 1.4–7.7)
Neutrophils Relative %: 54.1 % (ref 43.0–77.0)
Platelets: 197 10*3/uL (ref 150.0–400.0)
RBC: 4.04 Mil/uL (ref 3.87–5.11)
RDW: 13.4 % (ref 11.5–15.5)
WBC: 4.3 10*3/uL (ref 4.0–10.5)

## 2020-02-04 LAB — VITAMIN B12: Vitamin B-12: 231 pg/mL (ref 211–911)

## 2020-02-04 LAB — FERRITIN: Ferritin: 33.1 ng/mL (ref 10.0–291.0)

## 2020-02-04 LAB — LIPID PANEL
Cholesterol: 174 mg/dL (ref 0–200)
HDL: 80.5 mg/dL (ref >39.00)
LDL Cholesterol: 81 mg/dL (ref 0–99)
NonHDL: 93.22
Total CHOL/HDL Ratio: 2
Triglycerides: 63 mg/dL (ref 0.0–149.0)
VLDL: 12.6 mg/dL (ref 0.0–40.0)

## 2020-02-04 LAB — TSH: TSH: 2.65 u[IU]/mL (ref 0.35–4.50)

## 2020-02-04 LAB — IRON: Iron: 60 ug/dL (ref 42–145)

## 2020-02-04 LAB — FOLATE: Folate: 8.6 ng/mL (ref >5.9)

## 2020-02-11 ENCOUNTER — Encounter: Payer: Self-pay | Admitting: Family Medicine

## 2020-02-12 ENCOUNTER — Other Ambulatory Visit: Payer: Commercial Managed Care - PPO

## 2020-02-18 ENCOUNTER — Ambulatory Visit (INDEPENDENT_AMBULATORY_CARE_PROVIDER_SITE_OTHER): Payer: Commercial Managed Care - PPO | Admitting: Obstetrics & Gynecology

## 2020-02-18 ENCOUNTER — Encounter: Payer: Self-pay | Admitting: Family Medicine

## 2020-02-18 ENCOUNTER — Encounter: Payer: Self-pay | Admitting: Obstetrics & Gynecology

## 2020-02-18 ENCOUNTER — Other Ambulatory Visit: Payer: Self-pay

## 2020-02-18 ENCOUNTER — Ambulatory Visit (INDEPENDENT_AMBULATORY_CARE_PROVIDER_SITE_OTHER): Payer: Commercial Managed Care - PPO | Admitting: Family Medicine

## 2020-02-18 VITALS — BP 131/78 | HR 65 | Ht 63.0 in | Wt 165.0 lb

## 2020-02-18 VITALS — BP 132/70 | HR 71 | Temp 96.9°F | Ht 63.0 in | Wt 163.5 lb

## 2020-02-18 DIAGNOSIS — K769 Liver disease, unspecified: Secondary | ICD-10-CM

## 2020-02-18 DIAGNOSIS — R5382 Chronic fatigue, unspecified: Secondary | ICD-10-CM

## 2020-02-18 DIAGNOSIS — E559 Vitamin D deficiency, unspecified: Secondary | ICD-10-CM | POA: Diagnosis not present

## 2020-02-18 DIAGNOSIS — Z23 Encounter for immunization: Secondary | ICD-10-CM

## 2020-02-18 DIAGNOSIS — R102 Pelvic and perineal pain: Secondary | ICD-10-CM | POA: Diagnosis not present

## 2020-02-18 DIAGNOSIS — E663 Overweight: Secondary | ICD-10-CM

## 2020-02-18 DIAGNOSIS — E538 Deficiency of other specified B group vitamins: Secondary | ICD-10-CM

## 2020-02-18 DIAGNOSIS — Z0000 Encounter for general adult medical examination without abnormal findings: Secondary | ICD-10-CM

## 2020-02-18 DIAGNOSIS — N809 Endometriosis, unspecified: Secondary | ICD-10-CM

## 2020-02-18 NOTE — Progress Notes (Signed)
Patient states she is have some abdominal pain that comes and goes. And increases with physical activity. CT scan on 01-14-20 . Kathrene Alu RN

## 2020-02-18 NOTE — Progress Notes (Signed)
History:  40 y.o. [name] here today for eval of mild intermittent pelvic pain. She also had some petechiae and she wanted to see if there was any relation to her pelvic pain. She does not require meds and this does not limits her ADLs.       The following portions of the patient's history were reviewed and updated as appropriate: allergies, current medications, past family history, past medical history, past social history, past surgical history and problem list.  Review of Systems:  Pertinent items are noted in HPI.    Objective:  Physical Exam Blood pressure 131/78, pulse 65, height 5\' 3"  (1.6 m), weight 165 lb (74.8 kg), last menstrual period 04/03/2019.  CONSTITUTIONAL: Well-developed, well-nourished female in no acute distress.  HENT:  Normocephalic, atraumatic EYES: Conjunctivae and EOM are normal. No scleral icterus.  NECK: Normal range of motion SKIN: Skin is warm and dry. No rash noted. Not diaphoretic.No pallor. [place]: Alert and oriented to person, place, and time. Normal coordination.  Abd: Soft, nontender and nondistended Pelvic: Normal appearing external genitalia; normal appearing vaginal mucosa and cervix.  Normal discharge.  No adnexal tenderness Or masses.     Labs and Imaging 01/14/2020 CLINICAL DATA:  40 year old female with recent hysterectomy presenting with spontaneous bruising to the abdomen.  EXAM: CT ABDOMEN AND PELVIS WITH CONTRAST  TECHNIQUE: Multidetector CT imaging of the abdomen and pelvis was performed using the standard protocol following bolus administration of intravenous contrast.  CONTRAST:  154mL OMNIPAQUE IOHEXOL 300 MG/ML  SOLN  COMPARISON:  CT abdomen pelvis dated 12/06/2013.  FINDINGS: Lower chest: The visualized lung bases are clear.  No intra-abdominal free air or free fluid.  Hepatobiliary: Multiple hepatic hypodense lesions are not characterized but appear to have been present on the CT of 03/06/2013 however  slightly increased in the interim. One the lesion demonstrates nodular enhancement. These likely represent hemangioma. MRI without and with contrast may provide better characterization. There is no intrahepatic biliary ductal dilatation. The gallbladder is surgically absent.  Pancreas: Unremarkable. No pancreatic ductal dilatation or surrounding inflammatory changes.  Spleen: Normal in size without focal abnormality.  Adrenals/Urinary Tract: The adrenal glands unremarkable. There is no hydronephrosis on either side. There is symmetric enhancement and excretion of contrast by both kidneys. The visualized ureters and urinary bladder appear unremarkable.  Stomach/Bowel: There is no bowel obstruction or active inflammation. The appendix is normal.  Vascular/Lymphatic: The abdominal aorta and IVC unremarkable. No portal venous gas. There is no adenopathy.  Reproductive: Hysterectomy. There is an 18 mm hypodense lesion in the left ovary, likely a dominant follicle or cyst. The right ovary is unremarkable.  Other: None  Musculoskeletal: No acute or significant osseous findings.  IMPRESSION: 1. No acute intra-abdominal or pelvic pathology. No bowel obstruction. Normal appendix. 2. Indeterminate hepatic hypodense lesions, likely hemangioma. MRI without and with contrast may provide better characterization. 3. Hysterectomy.   Assessment & Plan:  Adnexal tenderness. Normal exam. Rec observation for now. UA was done and was neg.   F/u prn  Total face-to-face time with patient was 20 min.  Greater than 50% was spent in counseling and coordination of care with the patient.   Wava Kildow L. Harraway-Smith, M.D., [name]

## 2020-02-18 NOTE — Assessment & Plan Note (Signed)
Vitamin D level is therapeutic with current supplementation Disc importance of this to bone and overall health Level of 40.4

## 2020-02-18 NOTE — Patient Instructions (Addendum)
Don't forget to schedule your mammogram at the breast center   Take care of yourself   Please try to get protein in your diet  Beans, lentils, chick peas, fish  Nuts , almond butter , almond and oat milk   Add exercise when you are ready    Increase your vitamin B12 to daily

## 2020-02-18 NOTE — Assessment & Plan Note (Signed)
Lab Results  Component Value Date   [id number] 02/04/2020   Enc pt to take her B12 supplement (2500 mcg) every day instead of 3 times weekly  Already eats a balanced diet

## 2020-02-18 NOTE — Assessment & Plan Note (Signed)
Discussed how this problem influences overall health and the risks it imposes  Reviewed plan for weight loss with lower calorie diet (via better food choices and also portion control or program like weight watchers) and exercise building up to or more than 30 minutes 5 days per week including some aerobic activity    

## 2020-02-18 NOTE — Assessment & Plan Note (Signed)
Reviewed health habits including diet and exercise and skin cancer prevention Reviewed appropriate screening tests for age  Also reviewed health mt list, fam hx and immunization status , as well as social and family history   Labs reviewed  Good diet - enc more regular exercise Pap utd with gyn care (had a partial hysterectomy)  Had derm visit and skin cancer removed  Planning mammogram-given info to schedule herself  Pt declines covid and flu vaccines (encouraged her to ask questions but she had none)  Td updated today Good cholesterol profile

## 2020-02-18 NOTE — Progress Notes (Signed)
Subjective:    Patient ID: [name], female    DOB: Apr 09, 1980, 40 y.o.   MRN: [id number]  This visit occurred during the SARS-CoV-2 public health emergency.  Safety protocols were in place, including screening questions prior to the visit, additional usage of staff PPE, and extensive cleaning of exam room while observing appropriate contact time as indicated for disinfecting solutions.    HPI Here for health maintenance exam and to review chronic medical problems   Wt Readings from Last 3 Encounters:  02/18/20 163 lb 8 oz (74.2 kg)  02/18/20 165 lb (74.8 kg)  01/14/20 157 lb (71.2 kg)   28.96 kg/m Wants to start exercise , will start walking at the track  Does do yoga twice per week and some weight  Eats lot of veggies/ no meat  Some protein supplement - 2 servings per day/occ protein shake  Likes beans and lentils   Is tired  Hot flashes have leveled out  Gets hot easier since partial hyst   Goes to sleep and then wakes up within 5 hours  Sometimes she can go back to sleep    Pap 1/19 neg with neg HPV test  Pt had gyn visit today with pelvic exam (had hysterectomy)  Had adnexal tenderness and rec obs Had neg ua also   Mammogram -11/20  Self breast exam -no lumps   covid status- has not had covid  Flu shot- declines  Tdap vaccine 4/12  -due in April   , will do today  Has farm animals   BP Readings from Last 3 Encounters:  02/18/20 132/70  02/18/20 131/78  01/14/20 138/82   Pulse Readings from Last 3 Encounters:  02/18/20 71  02/18/20 65  01/14/20 67     Vit B12 def Lab Results  Component Value Date   VITAMINB12 231 02/04/2020  takes 2500 mcg 3 days per week  Vit D def level 40.4   Chronic fatigue  More recentl bruising  Lab Results  Component Value Date   WBC 4.3 02/04/2020   HGB 12.2 02/04/2020   HCT 36.7 02/04/2020   MCV 90.8 02/04/2020   PLT 197.0 02/04/2020   Lab Results  Component Value Date   FERRITIN 33.1 02/04/2020    Folate level nl and iron level nl also   Cholesterol Lab Results  Component Value Date   CHOL 174 02/04/2020   CHOL 166 10/13/2018   CHOL 162 01/26/2017   Lab Results  Component Value Date   HDL 80.50 02/04/2020   HDL 79.10 10/13/2018   HDL 71.30 01/26/2017   Lab Results  Component Value Date   LDLCALC 81 02/04/2020   LDLCALC 74 10/13/2018   LDLCALC 73 01/26/2017   Lab Results  Component Value Date   TRIG 63.0 02/04/2020   TRIG 61.0 10/13/2018   TRIG 87.0 01/26/2017   Lab Results  Component Value Date   CHOLHDL 2 02/04/2020   CHOLHDL 2 10/13/2018   CHOLHDL 2 01/26/2017   No results found for: LDLDIRECT   Other labs Lab Results  Component Value Date   TSH 2.65 02/04/2020   Lab Results  Component Value Date   CREATININE 0.56 02/04/2020   BUN 8 02/04/2020   NA 138 02/04/2020   K 4.2 02/04/2020   CL 104 02/04/2020   CO2 28 02/04/2020   Lab Results  Component Value Date   ALT 10 02/04/2020   AST 12 02/04/2020   ALKPHOS 35 (L) 02/04/2020   BILITOT 0.5  02/04/2020    Taking zinc and mag and B12   Patient Active Problem List   Diagnosis Date Noted  . Bruising 01/30/2020  . Liver lesion 01/15/2020  . Post-operative state 04/10/2019  . Elevated blood pressure reading 01/24/2019  . Screening mammogram, encounter for 10/18/2018  . Vitamin D deficiency 08/22/2018  . Encounter for routine gynecological examination 02/02/2017  . Paresthesia of arm 10/13/2016  . Eczema 01/29/2015  . Health maintenance examination 05/16/2014  . Paresthesias 05/16/2014  . Anxiety disorder 03/19/2013  . Left ovarian cyst 03/07/2013  . Chronic pelvic pain in female 01/09/2013  . ADD (attention deficit disorder) 02/29/2012  . Fibromyalgia 03/26/2011  . Myofascial pain 03/19/2011  . Gluten intolerance 01/22/2011  . Overweight (BMI 25.0-29.9) 11/09/2010  . Routine general medical examination at a health care facility 10/28/2010  . Fatigue 05/18/2010  . FIBROCYSTIC BREAST  DISEASE 03/27/2010  . CAVERNOUS HEMANGIOMA, LIVER 12/03/2009  . PERSONAL HISTORY OF COLONIC POLYPS 12/03/2009  . ALLERGIC RHINITIS 03/09/2008  . URINARY FREQUENCY, CHRONIC 01/31/2007  . Vitamin B12 deficiency 10/28/2006  . GERD 04/27/2006  . Endometriosis 04/27/2006  . RENAL CALCULUS, HX OF 04/27/2006  . MIGRAINES, HX OF 04/27/2006   Past Medical History:  Diagnosis Date  . ADD (attention deficit disorder)   . Complication of anesthesia   . Endometriosis   . Family history of malignant neoplasm of gastrointestinal tract   . Fatigue   . Fibromyalgia   . GERD (gastroesophageal reflux disease)   . Kidney stone   . Migraine   . Personal history of colonic polyps 06/01/2006   sessil serrated adenoma  . PONV (postoperative nausea and vomiting)   . Vitamin B12 deficiency    Past Surgical History:  Procedure Laterality Date  . BREAST SURGERY  2004   breast reduction  . CHOLECYSTECTOMY    . LAPAROSCOPY  11/2004   endometriosis  . OVARIAN CYST SURGERY    . REDUCTION MAMMAPLASTY    . ROBOTIC ASSISTED TOTAL HYSTERECTOMY Bilateral 04/10/2019   Procedure: XI ROBOTIC ASSISTED TOTAL HYSTERECTOMY AND SALPINGECTOMY AND RIGHT OVARIAN CYSTECTOMY;  Surgeon: Lavonia Drafts, MD;  Location: [facility];  Service: Gynecology;  Laterality: Bilateral;  . TUBAL LIGATION  2008  . WISDOM TOOTH EXTRACTION     with four other teeth as well   Social History   Tobacco Use  . Smoking status: Former Smoker    Packs/day: 0.25    Quit date: 01/25/2005    Years since quitting: 15.0  . Smokeless tobacco: Never Used  Vaping Use  . Vaping Use: Never used  Substance Use Topics  . Alcohol use: Yes    Comment: rare  . Drug use: No   Family History  Problem Relation Age of Onset  . Cervical cancer Mother 41  . Hypertension Mother   . Diabetes Mother   . Kidney cancer Mother 20  . Cancer Mother        Melanoma  . Kidney failure Brother 29       s/p transplant ?due to PCKD or  renal stones  . Wilson's disease Father        liver transplant  . Ulcerative colitis Father   . Liver disease Father   . Colon polyps Sister   . Breast cancer Maternal Aunt   . Breast cancer Maternal Grandmother   . Colon cancer Paternal Grandfather   . Esophageal cancer Neg Hx   . Stomach cancer Neg Hx   . Rectal cancer Neg Hx  Allergies  Allergen Reactions  . Strawberry Extract Anaphylaxis and Hives  . Clindamycin Hives    angioedema  . Gabapentin     Dizzy/ worse headache  . Morphine And Related     Agitated and Angry  . Penicillins Hives    Angioedema Did it involve swelling of the face/tongue/throat, SOB, or low BP? No Did it involve sudden or severe rash/hives, skin peeling, or any reaction on the inside of your mouth or nose? No Did you need to seek medical attention at a hospital or doctor's office? No When did it last happen?70 or 40 years old If all above answers are "NO", may proceed with cephalosporin use.   Marland Kitchen Doxycycline Rash  . Sulfa Antibiotics Rash   Current Outpatient Medications on File Prior to Visit  Medication Sig Dispense Refill  . Cyanocobalamin (B-12) 2500 MCG TABS Take by mouth.    . magnesium gluconate (MAGONATE) 500 MG tablet Take 5,000 mg by mouth once a week.    Marland Kitchen VITAMIN D PO Take 10,000 Units by mouth once a week.     No current facility-administered medications on file prior to visit.    Review of Systems  Constitutional: Positive for fatigue. Negative for activity change, appetite change, fever and unexpected weight change.       Having a hard time loosing weight   HENT: Negative for congestion, ear pain, rhinorrhea, sinus pressure and sore throat.   Eyes: Negative for pain, redness and visual disturbance.  Respiratory: Negative for cough, shortness of breath and wheezing.   Cardiovascular: Negative for chest pain and palpitations.  Gastrointestinal: Negative for abdominal pain, blood in stool, constipation and diarrhea.   Endocrine: Negative for polydipsia and polyuria.  Genitourinary: Negative for dysuria, frequency and urgency.  Musculoskeletal: Positive for arthralgias. Negative for back pain and myalgias.  Skin: Negative for pallor and rash.  Allergic/Immunologic: Negative for environmental allergies.  Neurological: Negative for dizziness, syncope and headaches.  Hematological: Negative for adenopathy. Does not bruise/bleed easily.  Psychiatric/Behavioral: Negative for decreased concentration and dysphoric mood. The patient is not nervous/anxious.        Objective:   Physical Exam Constitutional:      General: She is not in acute distress.    Appearance: Normal appearance. She is well-developed and normal weight. She is not ill-appearing or diaphoretic.  HENT:     Head: Normocephalic and atraumatic.     Right Ear: Tympanic membrane, ear canal and external ear normal.     Left Ear: Tympanic membrane, ear canal and external ear normal.     Nose: Nose normal. No congestion.     Mouth/Throat:     Mouth: Mucous membranes are moist.     Pharynx: Oropharynx is clear. No posterior oropharyngeal erythema.  Eyes:     General: No scleral icterus.    Extraocular Movements: Extraocular movements intact.     Conjunctiva/sclera: Conjunctivae normal.     Pupils: Pupils are equal, round, and reactive to light.  Neck:     Thyroid: No thyromegaly.     Vascular: No carotid bruit or JVD.  Cardiovascular:     Rate and Rhythm: Normal rate and regular rhythm.     Pulses: Normal pulses.     Heart sounds: Normal heart sounds. No gallop.   Pulmonary:     Effort: Pulmonary effort is normal. No respiratory distress.     Breath sounds: Normal breath sounds. No wheezing.     Comments: Good air exch Chest:  Chest wall: No tenderness.  Abdominal:     General: Bowel sounds are normal. There is no distension or abdominal bruit.     Palpations: Abdomen is soft. There is no mass.     Tenderness: There is no abdominal  tenderness.     Hernia: No hernia is present.  Genitourinary:    Comments: Pelvic and breast exam done by gyn provider (earlier today) Musculoskeletal:        General: No tenderness. Normal range of motion.     Cervical back: Normal range of motion and neck supple. No rigidity. No muscular tenderness.     Right lower leg: No edema.     Left lower leg: No edema.  Lymphadenopathy:     Cervical: No cervical adenopathy.  Skin:    General: Skin is warm and dry.     Coloration: Skin is not pale.     Findings: No erythema or rash.  Neurological:     Mental Status: She is alert. Mental status is at baseline.     Cranial Nerves: No cranial nerve deficit.     Motor: No abnormal muscle tone.     Coordination: Coordination normal.     Gait: Gait normal.     Deep Tendon Reflexes: Reflexes are normal and symmetric. Reflexes normal.  Psychiatric:        Mood and Affect: Mood normal.        Cognition and Memory: Cognition and memory normal.     Comments: Pleasant            Assessment & Plan:   Problem List Items Addressed This Visit      Other   Vitamin B12 deficiency    Lab Results  Component Value Date   [id number] 177 02/04/2020   Enc pt to take her B12 supplement (2500 mcg) every day instead of 3 times weekly  Already eats a balanced diet       Endometriosis    Doing better s/p partial hysterectomy      Fatigue    Nl labs today incl B12 and iron level  Recommend more regular exercise       Routine general medical examination at a health care facility - Primary    Reviewed health habits including diet and exercise and skin cancer prevention Reviewed appropriate screening tests for age  Also reviewed health mt list, fam hx and immunization status , as well as social and family history   Labs reviewed  Good diet - enc more regular exercise Pap utd with gyn care (had a partial hysterectomy)  Had derm visit and skin cancer removed  Planning mammogram-given info to  schedule herself  Pt declines covid and flu vaccines (encouraged her to ask questions but she had none)  Td updated today Good cholesterol profile       Overweight (BMI 25.0-29.9)    Discussed how this problem influences overall health and the risks it imposes  Reviewed plan for weight loss with lower calorie diet (via better food choices and also portion control or program like weight watchers) and exercise building up to or more than 30 minutes 5 days per week including some aerobic activity         Vitamin D deficiency    Vitamin D level is therapeutic with current supplementation Disc importance of this to bone and overall health Level of 40.4      Liver lesion    Pending [place] to clarify finding from CT scan  Other Visit Diagnoses    Need for Td vaccine       Relevant Orders   Td : Tetanus/diphtheria >7yo Preservative  free (Completed)

## 2020-02-18 NOTE — Assessment & Plan Note (Signed)
Pending [place] to clarify finding from CT scan

## 2020-02-18 NOTE — Assessment & Plan Note (Signed)
Nl labs today incl B12 and iron level  Recommend more regular exercise

## 2020-02-18 NOTE — Assessment & Plan Note (Signed)
Doing better s/p partial hysterectomy

## 2020-02-28 ENCOUNTER — Ambulatory Visit
Admission: RE | Admit: 2020-02-28 | Discharge: 2020-02-28 | Disposition: A | Discharge status: Home / Self Care | Payer: Commercial Managed Care - PPO | Source: Ambulatory Visit | Attending: Family Medicine | Admitting: Family Medicine

## 2020-02-28 DIAGNOSIS — K769 Liver disease, unspecified: Secondary | ICD-10-CM

## 2020-03-27 ENCOUNTER — Encounter: Payer: Self-pay | Admitting: Family Medicine

## 2020-04-05 ENCOUNTER — Encounter: Payer: Self-pay | Admitting: Family Medicine

## 2020-04-07 MED ORDER — NURTEC 75 MG PO TBDP: 75.0000 mg | ORAL_TABLET | Freq: Every day | ORAL | 1 refills | Status: DC | PRN

## 2020-04-09 ENCOUNTER — Encounter: Payer: Self-pay | Admitting: Family Medicine

## 2020-04-10 NOTE — Telephone Encounter (Signed)
PA done for Nurtec at [email or web address], I will await a response but FYI, PA is needed for quantity not med. Pt's insurance covers 16 pills per month so if denied we can resend med for #16 tabs and it should be covered

## 2020-06-28 ENCOUNTER — Encounter: Payer: Self-pay | Admitting: *Deleted

## 2020-06-28 ENCOUNTER — Ambulatory Visit: Payer: Self-pay

## 2020-06-28 ENCOUNTER — Other Ambulatory Visit: Payer: Self-pay

## 2020-06-28 ENCOUNTER — Ambulatory Visit
Admission: EM | Admit: 2020-06-28 | Discharge: 2020-06-28 | Disposition: A | Discharge status: Home / Self Care | Payer: Commercial Managed Care - PPO | Attending: Urgent Care | Admitting: Urgent Care

## 2020-06-28 DIAGNOSIS — R0982 Postnasal drip: Secondary | ICD-10-CM | POA: Insufficient documentation

## 2020-06-28 DIAGNOSIS — R070 Pain in throat: Secondary | ICD-10-CM | POA: Insufficient documentation

## 2020-06-28 DIAGNOSIS — J3089 Other allergic rhinitis: Secondary | ICD-10-CM | POA: Diagnosis present

## 2020-06-28 LAB — POCT RAPID STREP A (OFFICE): Rapid Strep A Screen: NEGATIVE

## 2020-06-28 MED ORDER — CETIRIZINE HCL 10 MG PO TABS: 10.0000 mg | ORAL_TABLET | Freq: Every day | ORAL | 0 refills | Status: DC

## 2020-06-28 MED ORDER — FLUTICASONE PROPIONATE 50 MCG/ACT NA SUSP: 2.0000 | Freq: Every day | NASAL | 0 refills | Status: DC

## 2020-06-28 MED ORDER — METHYLPREDNISOLONE ACETATE 80 MG/ML IJ SUSP
80.0000 mg | Freq: Once | INTRAMUSCULAR | Status: AC
  Administered 2020-06-28: 80 mg via INTRAMUSCULAR

## 2020-06-28 MED ORDER — PSEUDOEPHEDRINE HCL 30 MG PO TABS: 30.0000 mg | ORAL_TABLET | Freq: Three times a day (TID) | ORAL | 0 refills | Status: DC | PRN

## 2020-06-28 MED ORDER — PREDNISONE 10 MG PO TABS: 30.0000 mg | ORAL_TABLET | Freq: Every day | ORAL | 0 refills | Status: DC

## 2020-06-28 NOTE — ED Provider Notes (Signed)
[name]   MRN: [id number] DOB: [birth year]  Subjective:   [name] is a 40 y.o. female presenting for 5-day history of persistent worsening throat pain, painful swallowing, fever, body aches, intermittent cough and sinus drainage.  Patient has taken to COVID-19 testing both were negative.  She had a virtual visit 4 days ago and was treated for clinical diagnosis of strep with a Z-Pak.  Has not had any improvement in her symptoms.  Has been using Tylenol and ibuprofen with minimal relief.  Of note, patient does have a history of significant allergic rhinitis and feels like this has been severe this past spring.  She is taking Claritin but nothing else for this.  Denies chest pain, shortness of breath, difficulty controlling secretions.    No current facility-administered medications for this encounter.  Current Outpatient Medications:  .  azithromycin (ZITHROMAX Z-PAK) 250 MG tablet, Take by mouth daily., Disp: , Rfl:  .  Cyanocobalamin (B-12) 2500 MCG TABS, Take by mouth., Disp: , Rfl:  .  Rimegepant Sulfate (NURTEC) 75 MG TBDP, Take 75 mg by mouth daily as needed., Disp: 30 tablet, Rfl: 1 .  VITAMIN D PO, Take 10,000 Units by mouth once a week., Disp: , Rfl:  .  magnesium gluconate (MAGONATE) 500 MG tablet, Take 5,000 mg by mouth once a week., Disp: , Rfl:    Allergies  Allergen Reactions  . Strawberry Extract Anaphylaxis and Hives  . Clindamycin Hives    angioedema  . Gabapentin     Dizzy/ worse headache  . Morphine And Related     Agitated and Angry  . Penicillins Hives    Angioedema Did it involve swelling of the face/tongue/throat, SOB, or low BP? No Did it involve sudden or severe rash/hives, skin peeling, or any reaction on the inside of your mouth or nose? No Did you need to seek medical attention at a hospital or doctor's office? No When did it last happen?33 or 40 years old If all above answers are "NO", may proceed with cephalosporin use.   Marland Kitchen  Doxycycline Rash  . Sulfa Antibiotics Rash    Past Medical History:  Diagnosis Date  . ADD (attention deficit disorder)   . Complication of anesthesia   . Endometriosis   . Family history of malignant neoplasm of gastrointestinal tract   . Fatigue   . Fibromyalgia   . GERD (gastroesophageal reflux disease)   . Kidney stone   . Migraine   . Personal history of colonic polyps 06/01/2006   sessil serrated adenoma  . PONV (postoperative nausea and vomiting)   . Vitamin B12 deficiency      Past Surgical History:  Procedure Laterality Date  . ABDOMINAL HYSTERECTOMY    . BREAST SURGERY  2004   breast reduction  . CHOLECYSTECTOMY    . LAPAROSCOPY  11/2004   endometriosis  . OVARIAN CYST SURGERY    . REDUCTION MAMMAPLASTY    . ROBOTIC ASSISTED TOTAL HYSTERECTOMY Bilateral 04/10/2019   Procedure: XI ROBOTIC ASSISTED TOTAL HYSTERECTOMY AND SALPINGECTOMY AND RIGHT OVARIAN CYSTECTOMY;  Surgeon: Lavonia Drafts, MD;  Location: [facility];  Service: Gynecology;  Laterality: Bilateral;  . TUBAL LIGATION  2008  . WISDOM TOOTH EXTRACTION     with four other teeth as well    Family History  Problem Relation Age of Onset  . Cervical cancer Mother 11  . Hypertension Mother   . Diabetes Mother   . Kidney cancer Mother 50  . Cancer  Mother        Melanoma  . Kidney failure Brother 74       s/p transplant ?due to PCKD or renal stones  . Wilson's disease Father        liver transplant  . Ulcerative colitis Father   . Liver disease Father   . Colon polyps Sister   . Breast cancer Maternal Aunt   . Breast cancer Maternal Grandmother   . Colon cancer Paternal Grandfather   . Esophageal cancer Neg Hx   . Stomach cancer Neg Hx   . Rectal cancer Neg Hx     Social History   Tobacco Use  . Smoking status: Former Smoker    Packs/day: 0.25    Quit date: 01/25/2005    Years since quitting: 15.4  . Smokeless tobacco: Never Used  Vaping Use  . Vaping Use: Never used   Substance Use Topics  . Alcohol use: Yes    Comment: occasionally  . Drug use: No    ROS   Objective:   Vitals: BP 133/86   Pulse 75   Temp 98.3 F (36.8 C) (Temporal)   Resp 18   LMP 04/03/2019 (Exact Date)   SpO2 97%   Physical Exam Constitutional:      General: She is not in acute distress.    Appearance: Normal appearance. She is well-developed. She is not ill-appearing, toxic-appearing or diaphoretic.  HENT:     Head: Normocephalic and atraumatic.     Right Ear: Tympanic membrane and ear canal normal. No drainage or tenderness. No middle ear effusion. Tympanic membrane is not erythematous.     Left Ear: Tympanic membrane and ear canal normal. No drainage or tenderness.  No middle ear effusion. Tympanic membrane is not erythematous.     Nose: Nose normal. No congestion or rhinorrhea.     Mouth/Throat:     Mouth: Mucous membranes are moist. No oral lesions.     Pharynx: No pharyngeal swelling, oropharyngeal exudate, posterior oropharyngeal erythema or uvula swelling.     Tonsils: No tonsillar exudate or tonsillar abscesses.     Comments: Significant postnasal drainage overlying pharynx. Eyes:     Extraocular Movements: Extraocular movements intact.     Right eye: Normal extraocular motion.     Left eye: Normal extraocular motion.     Conjunctiva/sclera: Conjunctivae normal.     Pupils: Pupils are equal, round, and reactive to light.  Cardiovascular:     Rate and Rhythm: Normal rate and regular rhythm.     Pulses: Normal pulses.     Heart sounds: Normal heart sounds. No murmur heard. No friction rub. No gallop.   Pulmonary:     Effort: Pulmonary effort is normal. No respiratory distress.     Breath sounds: Normal breath sounds. No stridor. No wheezing, rhonchi or rales.  Musculoskeletal:     Cervical back: Normal range of motion and neck supple.  Lymphadenopathy:     Cervical: No cervical adenopathy.  Skin:    General: Skin is warm and dry.     Findings: No  rash.  Neurological:     General: No focal deficit present.     Mental Status: She is alert and oriented to person, place, and time.  Psychiatric:        Mood and Affect: Mood normal.        Behavior: Behavior normal.        Thought Content: Thought content normal.     Results for orders placed  or performed during the hospital encounter of 06/28/20 (from the past 24 hour(s))  POCT rapid strep A     Status: None   Collection Time: 06/28/20 10:12 AM  Result Value Ref Range   Rapid Strep A Screen Negative Negative    Assessment and Plan :   PDMP not reviewed this encounter.  1. Post-nasal drainage   2. Throat pain   3. Allergic rhinitis due to other allergic trigger, unspecified seasonality     Patient requested aggressive management and therefore we will trial IM Depo-Medrol to address postnasal drainage as primary source of her symptoms secondary to uncontrolled allergic rhinitis.  Recommended starting Zyrtec now and adding Flonase and pseudoephedrine in 1 week.  Low suspicion for retropharyngeal abscess, mono. Counseled patient on potential for adverse effects with medications prescribed/recommended today, ER and return-to-clinic precautions discussed, patient verbalized understanding.    Jaynee Eagles, PA-C 06/28/20 1053

## 2020-06-28 NOTE — ED Triage Notes (Signed)
Reports starting with severe sore throat, fever, body aches onset 5 days ago.  Had negative Covid test the following day.  Did virtual visit 4 days ago and was told likely strep - was given Z-pack; states taking last dose, but no improvement in severe sore throat.  States fevers and body aches resolved 2 days ago.

## 2020-07-01 LAB — CULTURE, GROUP A STREP (THRC)

## 2020-08-12 ENCOUNTER — Ambulatory Visit
Admission: RE | Admit: 2020-08-12 | Discharge: 2020-08-12 | Disposition: A | Discharge status: Home / Self Care | Payer: Commercial Managed Care - PPO | Source: Ambulatory Visit | Attending: Family Medicine | Admitting: Family Medicine

## 2020-08-12 ENCOUNTER — Other Ambulatory Visit: Payer: Self-pay

## 2020-08-12 DIAGNOSIS — Z1231 Encounter for screening mammogram for malignant neoplasm of breast: Secondary | ICD-10-CM

## 2020-08-13 ENCOUNTER — Encounter: Payer: Self-pay | Admitting: Family Medicine

## 2020-08-18 ENCOUNTER — Other Ambulatory Visit: Payer: Self-pay

## 2020-08-18 ENCOUNTER — Encounter: Payer: Self-pay | Admitting: Family Medicine

## 2020-08-18 ENCOUNTER — Ambulatory Visit: Payer: Commercial Managed Care - PPO | Admitting: Family Medicine

## 2020-08-18 ENCOUNTER — Encounter: Payer: Self-pay | Admitting: *Deleted

## 2020-08-18 VITALS — BP 134/72 | HR 61 | Temp 97.8°F | Ht 63.0 in | Wt 163.4 lb

## 2020-08-18 DIAGNOSIS — M542 Cervicalgia: Secondary | ICD-10-CM | POA: Diagnosis not present

## 2020-08-18 DIAGNOSIS — F430 Acute stress reaction: Secondary | ICD-10-CM

## 2020-08-18 NOTE — Assessment & Plan Note (Signed)
3 family losses in 3 weeks Taking care of her FIL with some dementia  Unable to do jury duty due to above  Note written to try to put it off for another time

## 2020-08-18 NOTE — Progress Notes (Signed)
Subjective:    Patient ID: [name], female    DOB: [birth year], 40 y.o.   MRN: [id number]  This visit occurred during the SARS-CoV-2 public health emergency.  Safety protocols were in place, including screening questions prior to the visit, additional usage of staff PPE, and extensive cleaning of exam room while observing appropriate contact time as indicated for disinfecting solutions.   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 and she has to do several jobs at once  Her mind sometimes races and it is hard to concentrate  (in setting of ADD)   First lost an aunt  Then her mother in Sports [employer] - died of PE at age 18 (unexpected)  Then uncle died -suddenly (MI)   Caring of her FIL He has CAD and multiple heart attacks and blood clots  Some dementia- she has to give his medicines  Occ wanders/ gets locked out  She keeps someone there 24 h per day currently (family takes turns)  Her son works 3rd shift- they are close and he helps during the day     Coping strategies -trying to take care of himself  Walks Yoga   Neck pain  4 weeks  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 an hour  Tightness in both sides of neck  Chiropractor did not help  Making her headaches worse  Tried hot tub-no help  Base of neck sticks out and is sore  Stretches Uses special pillow   No rad to arms or hands  Does ref to her occiput /behind R ear    DG Cervical Spine Complete (Accession [id number]) (Order [id number]) Imaging Date: 11/11/2016 Department: [facility]  [place] Released By: Antonietta Barcelona Authorizing: Joyce Heitman, Wynelle Fanny, MD    Exam Status  Status  Final [99]   PACS Intelerad Image Link   Show images for DG Cervical Spine Complete  Study Result  Narrative & Impression  CLINICAL DATA:  Pain.   EXAM: CERVICAL SPINE - COMPLETE 4+ VIEW   COMPARISON:  09/24/2015.   FINDINGS: No acute bony or joint abnormality identified. No evidence of fracture or dislocation. Soft tissues unremarkable.   IMPRESSION: No acute abnormality identified.     Patient Active Problem List   Diagnosis Date Noted   Stress reaction 08/18/2020   Bruising 01/30/2020   Liver lesion 01/15/2020   Post-operative state 04/10/2019   Elevated blood pressure reading 01/24/2019   Screening mammogram, encounter for 10/18/2018   Vitamin D deficiency 08/22/2018   Encounter for routine gynecological examination 02/02/2017   Neck pain 11/09/2016   Paresthesia of arm 10/13/2016   Eczema 01/29/2015   Health maintenance examination 05/16/2014   Paresthesias 05/16/2014   Anxiety disorder 03/19/2013   Left ovarian cyst 03/07/2013   Chronic pelvic pain in female 01/09/2013   ADD (attention deficit disorder) 02/29/2012   Fibromyalgia 03/26/2011   Myofascial pain  03/19/2011   Gluten intolerance 01/22/2011   Overweight (BMI 25.0-29.9) 11/09/2010   Routine general medical examination at a health care facility 10/28/2010   Fatigue 05/18/2010   FIBROCYSTIC BREAST DISEASE 03/27/2010   CAVERNOUS HEMANGIOMA, LIVER 12/03/2009   PERSONAL HISTORY OF COLONIC POLYPS 12/03/2009   ALLERGIC RHINITIS 03/09/2008   URINARY FREQUENCY, CHRONIC 01/31/2007   Vitamin B12 deficiency 10/28/2006   GERD 04/27/2006   Endometriosis 04/27/2006   RENAL CALCULUS, HX OF 04/27/2006   MIGRAINES, HX OF 04/27/2006   Past Medical History:  Diagnosis Date   ADD (attention deficit disorder)    Complication of anesthesia    Endometriosis    Family history of malignant neoplasm of  gastrointestinal tract    Fatigue    Fibromyalgia    GERD (gastroesophageal reflux disease)    Kidney stone    Migraine    Personal history of colonic polyps 06/01/2006   sessil serrated adenoma   PONV (postoperative nausea and vomiting)    Vitamin B12 deficiency    Past Surgical History:  Procedure Laterality Date   ABDOMINAL HYSTERECTOMY     BREAST SURGERY  2004   breast reduction   CHOLECYSTECTOMY     LAPAROSCOPY  11/2004   endometriosis   OVARIAN CYST SURGERY     REDUCTION MAMMAPLASTY     ROBOTIC ASSISTED TOTAL HYSTERECTOMY Bilateral 04/10/2019   Procedure: XI ROBOTIC ASSISTED TOTAL HYSTERECTOMY AND SALPINGECTOMY AND RIGHT OVARIAN CYSTECTOMY;  Surgeon: Lavonia Drafts, MD;  Location: [facility];  Service: Gynecology;  Laterality: Bilateral;   TUBAL LIGATION  2008   WISDOM TOOTH EXTRACTION     with four other teeth as well   Social History   Tobacco Use   Smoking status: Former    Packs/day: 0.25    Types: Cigarettes    Quit date: 01/25/2005    Years since quitting: 15.5   Smokeless tobacco: Never  Vaping Use   Vaping Use: Never used  Substance Use Topics   Alcohol use: Yes    Comment: occasionally   Drug use: No   Family History  Problem Relation Age of Onset   Cervical cancer Mother 42   Hypertension Mother    Diabetes Mother    Kidney cancer Mother 88   Cancer Mother        Melanoma   Kidney failure Brother 33       s/p transplant ?due to PCKD or renal stones   Wilson's disease Father        liver transplant   Ulcerative colitis Father    Liver disease Father    Colon polyps Sister    Breast cancer Maternal Aunt    Breast cancer Maternal Grandmother    Colon cancer Paternal Grandfather    Esophageal cancer Neg Hx    Stomach cancer Neg Hx    Rectal cancer Neg Hx    Allergies  Allergen Reactions   Strawberry Extract Anaphylaxis and Hives   Clindamycin Hives    angioedema   Gabapentin     Dizzy/ worse headache   Morphine  And Related     Agitated and Angry   Penicillins Hives    Angioedema Did it involve swelling of the face/tongue/throat, SOB, or low BP? No Did it involve sudden or severe rash/hives, skin peeling, or any reaction on the inside of your mouth or nose? No Did you need to seek medical attention at a hospital or doctor's office? No When did it last happen?   19 or  40 years old If all above answers are "NO", may proceed with cephalosporin use.    Doxycycline Rash   Sulfa Antibiotics Rash   Current Outpatient Medications on File Prior to Visit  Medication Sig Dispense Refill   Cyanocobalamin (B-12) 2500 MCG TABS Take by mouth.     magnesium gluconate (MAGONATE) 500 MG tablet Take 5,000 mg by mouth once a week.     Rimegepant Sulfate (NURTEC) 75 MG TBDP Take 75 mg by mouth daily as needed. 30 tablet 1   VITAMIN D PO Take 10,000 Units by mouth once a week.     cetirizine (ZYRTEC ALLERGY) 10 MG tablet Take 1 tablet (10 mg total) by mouth daily. (Patient not taking: Reported on 08/18/2020) 30 tablet 0   No current facility-administered medications on file prior to visit.    Review of Systems  Constitutional:  Negative for activity change, appetite change, fatigue, fever and unexpected weight change.  HENT:  Negative for congestion, ear pain, rhinorrhea, sinus pressure and sore throat.   Eyes:  Negative for pain, redness and visual disturbance.  Respiratory:  Negative for cough, shortness of breath and wheezing.   Cardiovascular:  Negative for chest pain and palpitations.  Gastrointestinal:  Negative for abdominal pain, blood in stool, constipation and diarrhea.  Endocrine: Negative for polydipsia and polyuria.  Genitourinary:  Negative for dysuria, frequency and urgency.  Musculoskeletal:  Positive for neck pain. Negative for arthralgias, back pain, myalgias and neck stiffness.  Skin:  Negative for pallor and rash.  Allergic/Immunologic: Negative for environmental allergies.  Neurological:   Negative for dizziness, syncope and headaches.  Hematological:  Negative for adenopathy. Does not bruise/bleed easily.  Psychiatric/Behavioral:  Negative for decreased concentration and dysphoric mood. The patient is nervous/anxious.        Stressors      Objective:   Physical Exam Constitutional:      General: She is not in acute distress.    Appearance: Normal appearance. She is well-developed and normal weight. She is not ill-appearing or diaphoretic.  HENT:     Head: Normocephalic and atraumatic.  Eyes:     Conjunctiva/sclera: Conjunctivae normal.     Pupils: Pupils are equal, round, and reactive to light.  Neck:     Thyroid: No thyroid mass, thyromegaly or thyroid tenderness.     Vascular: No carotid bruit or JVD.     Trachea: Trachea and phonation normal.     Meningeal: Kernig's sign absent.     Comments: Some mid line muscular soreness in neck Worse with flex and L lat tilt  No neuro changes    Cardiovascular:     Rate and Rhythm: Normal rate and regular rhythm.     Heart sounds: Normal heart sounds.    No gallop.  Pulmonary:     Effort: Pulmonary effort is normal. No respiratory distress.     Breath sounds: Normal breath sounds. No wheezing or rales.  Abdominal:     General: Bowel sounds are normal. There is no distension or abdominal bruit.     Palpations: Abdomen is soft. There is no mass.     Tenderness: There is no abdominal tenderness.  Musculoskeletal:     Cervical back: Normal range of motion and neck supple. No edema, erythema or rigidity. Pain with movement and muscular tenderness present. No spinous process tenderness. Normal range of motion.     Right lower leg: No edema.     Left lower leg: No edema.  Lymphadenopathy:  Cervical: No cervical adenopathy.  Skin:    General: Skin is warm and dry.     Coloration: Skin is not pale.     Findings: No rash.  Neurological:     Mental Status: She is alert.     Coordination: Coordination normal.     Deep  Tendon Reflexes: Reflexes are normal and symmetric. Reflexes normal.  Psychiatric:        Mood and Affect: Mood is anxious.        Cognition and Memory: Cognition and memory normal.     Comments: Mildly anxious  Pleasant  Discusses mood candidly along with stressors           Assessment & Plan:   Problem List Items Addressed This Visit       Other   Neck pain - Primary    Sounds acute on chronic and positional  Suspect muscular strain and spasm  Worse with neck flex and L sided lat tilt Disc use of ice and heat  Note written for work indicating that a standing desk is important  Tylenol or nsaid prn pain  Update if not starting to improve in a week or if worsening  Gentle stretches when improved  Consider PT if no imp       Relevant Orders   Ambulatory referral to Physical Therapy   Stress reaction    3 family losses in 3 weeks Taking care of her FIL with some dementia  Unable to do jury duty due to above  Note written to try to put it off for another time

## 2020-08-18 NOTE — Assessment & Plan Note (Signed)
Sounds acute on chronic and positional  Suspect muscular strain and spasm  Worse with neck flex and L sided lat tilt Disc use of ice and heat  Note written for work indicating that a standing desk is important  Tylenol or nsaid prn pain  Update if not starting to improve in a week or if worsening  Gentle stretches when improved  Consider PT if no imp

## 2020-08-18 NOTE — Patient Instructions (Addendum)
Work on getting a [employer] for work  Try to work with different positions at different times so you are not hunched over all the time   I placed a referral to PT. You will get a call  Take care of yourself  Use heat on your neck  Work to get a new desk and change work position when you can for better posture   I wrote note to put off jury duty if possible

## 2020-08-24 ENCOUNTER — Other Ambulatory Visit: Payer: Self-pay | Admitting: Family Medicine

## 2020-08-24 DIAGNOSIS — M542 Cervicalgia: Secondary | ICD-10-CM

## 2020-08-28 ENCOUNTER — Ambulatory Visit: Payer: Commercial Managed Care - PPO | Admitting: Family Medicine

## 2020-09-04 ENCOUNTER — Telehealth: Payer: Self-pay | Admitting: Family Medicine

## 2020-09-04 DIAGNOSIS — K769 Liver disease, unspecified: Secondary | ICD-10-CM

## 2020-09-04 NOTE — Telephone Encounter (Signed)
Please let pt know she is due for another liver ultrasound to re check spots (hemangiomas)  Let me know if agreeable and I will order it

## 2020-09-05 NOTE — Telephone Encounter (Signed)
Pt agrees with f/u US order, will await a call, please put order in

## 2020-09-07 NOTE — Telephone Encounter (Signed)
The order is in

## 2020-09-10 ENCOUNTER — Ambulatory Visit
Admission: RE | Admit: 2020-09-10 | Discharge: 2020-09-10 | Disposition: A | Discharge status: Home / Self Care | Payer: Commercial Managed Care - PPO | Source: Ambulatory Visit | Attending: Family Medicine | Admitting: Family Medicine

## 2020-09-10 DIAGNOSIS — K769 Liver disease, unspecified: Secondary | ICD-10-CM

## 2020-09-12 ENCOUNTER — Telehealth: Payer: Self-pay | Admitting: Family Medicine

## 2020-09-12 DIAGNOSIS — K769 Liver disease, unspecified: Secondary | ICD-10-CM

## 2020-09-12 NOTE — Telephone Encounter (Signed)
Patient advised and verbalized understanding 

## 2020-09-12 NOTE — Telephone Encounter (Signed)
I placed the order Cannot order MRI of neck until she has failed all conservative treatments with additional neurologic sympotms  Follow up if needed

## 2020-09-12 NOTE — Telephone Encounter (Signed)
-----   Message from Tammi Sou, [place] sent at 09/11/2020 12:29 PM EDT ----- Pt notified of [place] and Dr. Marliss Coots comments. She also viewed results on Mychart pt agrees with MRI she went go [place] [facility] and if possible would like to go back there for MRI.  ##pt did have a question. Pt said she is still dealing with neck pain and asked if she could get an MRI of neck at the same time she's there for her liver. I advise pt I would relay question to PCP, please advise

## 2020-10-02 ENCOUNTER — Other Ambulatory Visit: Payer: Self-pay

## 2020-10-02 ENCOUNTER — Ambulatory Visit
Admission: RE | Admit: 2020-10-02 | Discharge: 2020-10-02 | Disposition: A | Discharge status: Home / Self Care | Payer: Commercial Managed Care - PPO | Source: Ambulatory Visit | Attending: Family Medicine | Admitting: Family Medicine

## 2020-10-02 DIAGNOSIS — K769 Liver disease, unspecified: Secondary | ICD-10-CM

## 2020-10-02 MED ORDER — GADOBENATE DIMEGLUMINE 529 MG/ML IV SOLN
15.0000 mL | Freq: Once | INTRAVENOUS | Status: AC | PRN
  Administered 2020-10-02: 15 mL via INTRAVENOUS

## 2020-11-19 ENCOUNTER — Encounter: Payer: Self-pay | Admitting: Family Medicine

## 2020-11-25 NOTE — Telephone Encounter (Signed)
Form placed in PCP's inbox

## 2020-11-28 ENCOUNTER — Encounter: Payer: Self-pay | Admitting: Family Medicine

## 2020-11-28 DIAGNOSIS — G43909 Migraine, unspecified, not intractable, without status migrainosus: Secondary | ICD-10-CM

## 2020-12-03 NOTE — Addendum Note (Signed)
Addended by: Loura Pardon A on: 12/03/2020 09:31 PM   Modules accepted: Orders

## 2020-12-04 ENCOUNTER — Telehealth: Payer: Self-pay | Admitting: Family Medicine

## 2020-12-04 NOTE — Telephone Encounter (Signed)
[facility] need correct insurance for pt. If the pt has [employer] then they are out network with them

## 2020-12-05 NOTE — Telephone Encounter (Signed)
First available for [facility] is January 30th, patient is okay with this.   This is about right given they book out several weeks.   I told the patient I would let you know.

## 2020-12-05 NOTE — Telephone Encounter (Signed)
[facility] message sent back to patient about insurance.

## 2020-12-13 ENCOUNTER — Telehealth: Payer: Commercial Managed Care - PPO | Admitting: Nurse Practitioner

## 2020-12-13 DIAGNOSIS — J029 Acute pharyngitis, unspecified: Secondary | ICD-10-CM

## 2020-12-13 DIAGNOSIS — J069 Acute upper respiratory infection, unspecified: Secondary | ICD-10-CM

## 2020-12-13 MED ORDER — BENZONATATE 100 MG PO CAPS: 100.0000 mg | ORAL_CAPSULE | Freq: Three times a day (TID) | ORAL | 0 refills | Status: DC | PRN

## 2020-12-13 MED ORDER — FLUTICASONE PROPIONATE 50 MCG/ACT NA SUSP: 2.0000 | Freq: Every day | NASAL | 6 refills | Status: AC

## 2020-12-13 NOTE — Progress Notes (Signed)
E-Visit for Upper Respiratory Infection   We are sorry you are not feeling well.  Here is how we plan to help!  Based on what you have shared with me, it looks like you may have a viral upper respiratory infection.  Upper respiratory infections are caused by a large number of viruses; however, rhinovirus is the most common cause.  I suggest you do a covid test and possible flu test if not getting any better. Symptoms vary from person to person, with common symptoms including sore throat, cough, fatigue or lack of energy and feeling of general discomfort.  A low-grade fever of up to 100.4 may present, but is often uncommon.  Symptoms vary however, and are closely related to a person's age or underlying illnesses.  The most common symptoms associated with an upper respiratory infection are nasal discharge or congestion, cough, sneezing, headache and pressure in the ears and face.  These symptoms usually persist for about 3 to 10 days, but can last up to 2 weeks.  It is important to know that upper respiratory infections do not cause serious illness or complications in most cases.    Upper respiratory infections can be transmitted from person to person, with the most common method of transmission being a person's hands.  The virus is able to live on the skin and can infect other persons for up to 2 hours after direct contact.  Also, these can be transmitted when someone coughs or sneezes; thus, it is important to cover the mouth to reduce this risk.  To keep the spread of the illness at [place], good hand hygiene is very important.  This is an infection that is most likely caused by a virus. There are no specific treatments other than to help you with the symptoms until the infection runs its course.  We are sorry you are not feeling well.  Here is how we plan to help!   For nasal congestion, you may use an oral decongestants such as Mucinex D or if you have glaucoma or high blood pressure use plain Mucinex.   Saline nasal spray or nasal drops can help and can safely be used as often as needed for congestion.  For your congestion, I have prescribed Fluticasone nasal spray one spray in each nostril twice a day  If you do not have a history of heart disease, hypertension, diabetes or thyroid disease, prostate/bladder issues or glaucoma, you may also use Sudafed to treat nasal congestion.  It is highly recommended that you consult with a pharmacist or your primary care physician to ensure this medication is safe for you to take.     If you have a cough, you may use cough suppressants such as Delsym and Robitussin.  If you have glaucoma or high blood pressure, you can also use Coricidin HBP.   For cough I have prescribed for you A prescription cough medication called Tessalon Perles 100 mg. You may take 1-2 capsules every 8 hours as needed for cough  If you have a sore or scratchy throat, use a saltwater gargle-  to  teaspoon of salt dissolved in a 4-ounce to 8-ounce glass of warm water.  Gargle the solution for approximately 15-30 seconds and then spit.  It is important not to swallow the solution.  You can also use throat lozenges/cough drops and Chloraseptic spray to help with throat pain or discomfort.  Warm or cold liquids can also be helpful in relieving throat pain.  For headache, pain or  general discomfort, you can use Ibuprofen or Tylenol as directed.   Some authorities believe that zinc sprays or the use of Echinacea may shorten the course of your symptoms.   HOME CARE Only take medications as instructed by your medical team. Be sure to drink plenty of fluids. Water is fine as well as fruit juices, sodas and electrolyte beverages. You may want to stay away from caffeine or alcohol. If you are nauseated, try taking small sips of liquids. How do you know if you are getting enough fluid? Your urine should be a pale yellow or almost colorless. Get rest. Taking a steamy shower or using a humidifier may  help nasal congestion and ease sore throat pain. You can place a towel over your head and breathe in the steam from hot water coming from a faucet. Using a saline nasal spray works much the same way. Cough drops, hard candies and sore throat lozenges may ease your cough. Avoid close contacts especially the very young and the elderly Cover your mouth if you cough or sneeze Always remember to wash your hands.   GET HELP RIGHT AWAY IF: You develop worsening fever. If your symptoms do not improve within 10 days You develop yellow or green discharge from your nose over 3 days. You have coughing fits You develop a severe head ache or visual changes. You develop shortness of breath, difficulty breathing or start having chest pain Your symptoms persist after you have completed your treatment plan  MAKE SURE YOU  Understand these instructions. Will watch your condition. Will get help right away if you are not doing well or get worse.  Thank you for choosing an e-visit.  Your e-visit answers were reviewed by a board certified advanced clinical practitioner to complete your personal care plan. Depending upon the condition, your plan could have included both over the counter or prescription medications.  Please review your pharmacy choice. Make sure the pharmacy is open so you can pick up prescription now. If there is a problem, you may contact your provider through [employer] and have the prescription routed to another pharmacy.  Your safety is important to [place]. If you have drug allergies check your prescription carefully.   For the next 24 hours you can use MyChart to ask questions about today's visit, request a non-urgent call back, or ask for a work or school excuse. You will get an email in the next two days asking about your experience. I hope that your e-visit has been valuable and will speed your recovery.  5-10 minutes spent reviewing and documenting in chart.

## 2021-03-18 ENCOUNTER — Ambulatory Visit (INDEPENDENT_AMBULATORY_CARE_PROVIDER_SITE_OTHER): Payer: Commercial Managed Care - PPO | Admitting: Nurse Practitioner

## 2021-03-18 ENCOUNTER — Other Ambulatory Visit: Payer: Self-pay

## 2021-03-18 VITALS — BP 94/76 | HR 74 | Temp 96.2°F | Resp 12 | Ht 63.0 in | Wt 169.4 lb

## 2021-03-18 DIAGNOSIS — M791 Myalgia, unspecified site: Secondary | ICD-10-CM | POA: Insufficient documentation

## 2021-03-18 DIAGNOSIS — E538 Deficiency of other specified B group vitamins: Secondary | ICD-10-CM | POA: Diagnosis not present

## 2021-03-18 DIAGNOSIS — E559 Vitamin D deficiency, unspecified: Secondary | ICD-10-CM

## 2021-03-18 DIAGNOSIS — J020 Streptococcal pharyngitis: Secondary | ICD-10-CM | POA: Insufficient documentation

## 2021-03-18 DIAGNOSIS — J029 Acute pharyngitis, unspecified: Secondary | ICD-10-CM

## 2021-03-18 DIAGNOSIS — G2581 Restless legs syndrome: Secondary | ICD-10-CM

## 2021-03-18 LAB — CBC
HCT: 36.8 % (ref 36.0–46.0)
Hemoglobin: 12.5 g/dL (ref 12.0–15.0)
MCHC: 33.9 g/dL (ref 30.0–36.0)
MCV: 89.4 fl (ref 78.0–100.0)
Platelets: 212 10*3/uL (ref 150.0–400.0)
RBC: 4.12 Mil/uL (ref 3.87–5.11)
RDW: 12.9 % (ref 11.5–15.5)
WBC: 6.3 10*3/uL (ref 4.0–10.5)

## 2021-03-18 LAB — IBC + FERRITIN
Ferritin: 31.1 ng/mL (ref 10.0–291.0)
Iron: 88 ug/dL (ref 42–145)
Saturation Ratios: 23.3 % (ref 20.0–50.0)
TIBC: 378 ug/dL (ref 250.0–450.0)
Transferrin: 270 mg/dL (ref 212.0–360.0)

## 2021-03-18 LAB — VITAMIN B12: Vitamin B-12: 336 pg/mL (ref 211–911)

## 2021-03-18 LAB — POCT RAPID STREP A (OFFICE): Rapid Strep A Screen: POSITIVE — AB

## 2021-03-18 LAB — FOLATE: Folate: 17.9 ng/mL (ref >5.9)

## 2021-03-18 MED ORDER — AZITHROMYCIN 250 MG PO TABS: ORAL_TABLET | ORAL | 0 refills | Status: AC

## 2021-03-18 NOTE — Progress Notes (Signed)
Acute Office Visit  Subjective:    Patient ID: [name], female    DOB: 20-Feb-1980, 41 y.o.   MRN: [id number]  Chief Complaint  Patient presents with   Sore Throat    Recent flare up started 2 weeks ago, off and on has been having sore throat, swelling of throat, difficulty swallowing like food gets stuck. Last night woke patient up due to the pain "felt like there is a glass stuck in the throat." Had similar episode happened in May of 2022. No fever. No covid test done.     Patient is in today for  sore throat  Symptoms started approx 2 weeks ago Intermittent in nature States tues-Thursday has been taking tylenol-advil (2 tabs) in the night, it does help. Has been taking claritin. Has tried cold water and hot tea without relief States having trouble swallowing has had esophageal dilation in the past approx 12 years States has happened at the end of may last year and had other systemic symptoms No sick contacts. No covid vaccine No covid test   Past Medical History:  Diagnosis Date   ADD (attention deficit disorder)    Complication of anesthesia    Endometriosis    Family history of malignant neoplasm of gastrointestinal tract    Fatigue    Fibromyalgia    GERD (gastroesophageal reflux disease)    Kidney stone    Migraine    Personal history of colonic polyps 06/01/2006   sessil serrated adenoma   PONV (postoperative nausea and vomiting)    Vitamin B12 deficiency     Past Surgical History:  Procedure Laterality Date   ABDOMINAL HYSTERECTOMY     BREAST SURGERY  2004   breast reduction   CHOLECYSTECTOMY     LAPAROSCOPY  11/2004   endometriosis   OVARIAN CYST SURGERY     REDUCTION MAMMAPLASTY     ROBOTIC ASSISTED TOTAL HYSTERECTOMY Bilateral 04/10/2019   Procedure: XI ROBOTIC ASSISTED TOTAL HYSTERECTOMY AND SALPINGECTOMY AND RIGHT OVARIAN CYSTECTOMY;  Surgeon: Lavonia Drafts, MD;  Location: [facility];  Service: Gynecology;  Laterality:  Bilateral;   TUBAL LIGATION  2008   WISDOM TOOTH EXTRACTION     with four other teeth as well    Family History  Problem Relation Age of Onset   Cervical cancer Mother 56   Hypertension Mother    Diabetes Mother    Kidney cancer Mother 35   Cancer Mother        Melanoma   Kidney failure Brother 28       s/p transplant ?due to PCKD or renal stones   Wilson's disease Father        liver transplant   Ulcerative colitis Father    Liver disease Father    Colon polyps Sister    Breast cancer Maternal Aunt    Breast cancer Maternal Grandmother    Colon cancer Paternal Grandfather    Esophageal cancer Neg Hx    Stomach cancer Neg Hx    Rectal cancer Neg Hx     Social History   Socioeconomic History   Marital status: Married    Spouse name: Not on file   Number of children: 2   Years of education: Not on file   Highest education level: Not on file  Occupational History   Occupation: Product/process development scientist: [employer]  Tobacco Use   Smoking status: Former    Packs/day: 0.25    Types: Cigarettes  Quit date: 01/25/2005    Years since quitting: 16.1   Smokeless tobacco: Never  Vaping Use   Vaping Use: Never used  Substance and Sexual Activity   Alcohol use: Yes    Comment: occasionally   Drug use: No   Sexual activity: Yes    Birth control/protection: Surgical  Other Topics Concern   Not on file  Social History Narrative   Lives with husband and son, 1 dog   Occupation: Sales promotion account executive   Activity: yoga, pilates, walks 3x/wk 2 mi   Diet: gluten free diet, avoids fried foods, good water 32-64 oz, fruits/vegetables daily   Social Determinants of Health   Financial Resource Strain: Not on file  Food Insecurity: Not on file  Transportation Needs: Not on file  Physical Activity: Not on file  Stress: Not on file  Social Connections: Not on file  Intimate Partner Violence: Not on file    Outpatient Medications Prior to Visit  Medication Sig Dispense  Refill   Rimegepant Sulfate (NURTEC) 75 MG TBDP Take 75 mg by mouth daily as needed. 30 tablet 1   fluticasone (FLONASE) 50 MCG/ACT nasal spray Place 2 sprays into both nostrils daily. (Patient not taking: Reported on 03/18/2021) 16 g 6   benzonatate (TESSALON PERLES) 100 MG capsule Take 1 capsule (100 mg total) by mouth 3 (three) times daily as needed. 20 capsule 0   cetirizine (ZYRTEC ALLERGY) 10 MG tablet Take 1 tablet (10 mg total) by mouth daily. (Patient not taking: Reported on 08/18/2020) 30 tablet 0   Cyanocobalamin (B-12) 2500 MCG TABS Take by mouth.     magnesium gluconate (MAGONATE) 500 MG tablet Take 5,000 mg by mouth once a week.     VITAMIN D PO Take 10,000 Units by mouth once a week.     No facility-administered medications prior to visit.    Allergies  Allergen Reactions   Strawberry Extract Anaphylaxis and Hives   Clindamycin Hives    angioedema   Gabapentin     Dizzy/ worse headache   Morphine And Related     Agitated and Angry   Penicillins Hives    Angioedema Did it involve swelling of the face/tongue/throat, SOB, or low BP? No Did it involve sudden or severe rash/hives, skin peeling, or any reaction on the inside of your mouth or nose? No Did you need to seek medical attention at a hospital or doctor's office? No When did it last happen?   59 or 41 years old If all above answers are "NO", may proceed with cephalosporin use.    Doxycycline Rash   Sulfa Antibiotics Rash    Review of Systems  Constitutional:  Positive for fatigue. Negative for chills and fever.  HENT:  Positive for sore throat. Negative for congestion, ear discharge, ear pain, postnasal drip, sinus pressure, sinus pain and sneezing.   Respiratory:  Positive for cough (started last night intermittnetly). Negative for shortness of breath.   Cardiovascular:  Negative for chest pain.  Gastrointestinal:  Negative for abdominal pain, diarrhea, nausea and vomiting.  Musculoskeletal:  Positive for  myalgias (over the past few weeks her legs hurt at night). Negative for arthralgias.  Neurological:  Negative for headaches.      Objective:    Physical Exam Vitals and nursing note reviewed.  Constitutional:      Appearance: She is well-developed.  HENT:     Right Ear: Tympanic membrane, ear canal and external ear normal. There is no impacted cerumen.  Left Ear: Tympanic membrane, ear canal and external ear normal. There is no impacted cerumen.     Nose: No congestion.     Mouth/Throat:     Mouth: Mucous membranes are moist.     Pharynx: Uvula midline. Posterior oropharyngeal erythema present.  Cardiovascular:     Rate and Rhythm: Normal rate and regular rhythm.     Pulses: Normal pulses.     Heart sounds: Normal heart sounds.  Pulmonary:     Effort: Pulmonary effort is normal.     Breath sounds: Normal breath sounds.  Abdominal:     General: Bowel sounds are normal.  Musculoskeletal:     Right lower leg: No edema.     Left lower leg: No edema.  Lymphadenopathy:     Cervical: Cervical adenopathy present.  Skin:    General: Skin is warm.  Neurological:     Mental Status: She is alert.    BP 94/76    Pulse 74    Temp (!) 96.2 F (35.7 C)    Resp 12    Ht 5\' 3"  (1.6 m)    Wt 169 lb 7 oz (76.9 kg)    LMP 04/03/2019 (Exact Date)    SpO2 99%    BMI 30.01 kg/m  Wt Readings from Last 3 Encounters:  03/18/21 169 lb 7 oz (76.9 kg)  08/18/20 163 lb 6 oz (74.1 kg)  02/18/20 163 lb 8 oz (74.2 kg)    Health Maintenance Due  Topic Date Due   Hepatitis C Screening  Never done   PAP SMEAR-Modifier  02/03/2020    There are no preventive care reminders to display for this patient.   Lab Results  Component Value Date   TSH 2.65 02/04/2020   Lab Results  Component Value Date   WBC 4.3 02/04/2020   HGB 12.2 02/04/2020   HCT 36.7 02/04/2020   MCV 90.8 02/04/2020   PLT 197.0 02/04/2020   Lab Results  Component Value Date   NA 138 02/04/2020   K 4.2 02/04/2020   CO2  28 02/04/2020   GLUCOSE 82 02/04/2020   BUN 8 02/04/2020   CREATININE 0.56 02/04/2020   BILITOT 0.5 02/04/2020   ALKPHOS 35 (L) 02/04/2020   AST 12 02/04/2020   ALT 10 02/04/2020   PROT 6.8 02/04/2020   ALBUMIN 4.4 02/04/2020   CALCIUM 9.0 02/04/2020   ANIONGAP 6 04/03/2019   GFR 112.95 02/04/2020   Lab Results  Component Value Date   CHOL 174 02/04/2020   Lab Results  Component Value Date   HDL 80.50 02/04/2020   Lab Results  Component Value Date   LDLCALC 81 02/04/2020   Lab Results  Component Value Date   TRIG 63.0 02/04/2020   Lab Results  Component Value Date   CHOLHDL 2 02/04/2020   Lab Results  Component Value Date   HGBA1C 5.0 09/24/2015       Assessment & Plan:   Problem List Items Addressed This Visit       Respiratory   Strep pharyngitis    Strep test positive in office.  Patient does have allergy to cillins we will treat with azithromycin 250 mg pack as directed.  Follow-up if no improvement      Relevant Medications   azithromycin (ZITHROMAX) 250 MG tablet     Other   Vitamin B12 deficiency   Relevant Orders   Vitamin B12   Vitamin D deficiency    History of the same.  Did  require vitamin B12 injections in the past.  Currently managed with over-the-counter supplementation we will check lab today.      Restless legs    Given conjunction with the myalgia feeling at nighttime.  Given history of vitamin deficiencies in the past and patient is currently vegetarian we will check indices along with micronutrients.      Relevant Orders   CBC   IBC + Ferritin   Vitamin B12   Folate   Myalgia    Patient described a deep aching pain to bilateral legs mostly at nighttime patient is vegetarian and has had issues with B12 in the past along with magnesium.  Pending labs      Relevant Orders   CBC   IBC + Ferritin   Other Visit Diagnoses     Sore throat    -  Primary   Relevant Orders   Rapid Strep A (Completed)        Meds ordered  this encounter  Medications   azithromycin (ZITHROMAX) 250 MG tablet    Sig: Take 2 tablets on day 1, then 1 tablet daily on days 2 through 5    Dispense:  6 tablet    Refill:  0    Order Specific Question:   Supervising Provider    Answer:   Loura Pardon A [[id number]]   This visit occurred during the SARS-CoV-2 public health emergency.  Safety protocols were in place, including screening questions prior to the visit, additional usage of staff PPE, and extensive cleaning of exam room while observing appropriate contact time as indicated for disinfecting solutions.    Romilda Garret, NP

## 2021-03-18 NOTE — Assessment & Plan Note (Signed)
History of the same.  Did require vitamin B12 injections in the past.  Currently managed with over-the-counter supplementation we will check lab today.

## 2021-03-18 NOTE — Assessment & Plan Note (Signed)
Strep test positive in office.  Patient does have allergy to cillins we will treat with azithromycin 250 mg pack as directed.  Follow-up if no improvement

## 2021-03-18 NOTE — Assessment & Plan Note (Signed)
Given conjunction with the myalgia feeling at nighttime.  Given history of vitamin deficiencies in the past and patient is currently vegetarian we will check indices along with micronutrients.

## 2021-03-18 NOTE — Patient Instructions (Signed)
Nice to see you today I sent in antibiotic to pharmacy I will be in touch with the labs once I have the results Follow up if symptoms do not improve. If the sore throat continues we can consider an acid reducing medication

## 2021-03-18 NOTE — Assessment & Plan Note (Signed)
Patient described a deep aching pain to bilateral legs mostly at nighttime patient is vegetarian and has had issues with B12 in the past along with magnesium.  Pending labs

## 2021-04-01 ENCOUNTER — Encounter: Payer: Self-pay | Admitting: Gastroenterology

## 2021-05-18 ENCOUNTER — Ambulatory Visit (INDEPENDENT_AMBULATORY_CARE_PROVIDER_SITE_OTHER): Payer: Commercial Managed Care - PPO | Admitting: Family Medicine

## 2021-05-18 ENCOUNTER — Encounter: Payer: Self-pay | Admitting: Family Medicine

## 2021-05-18 VITALS — BP 102/72 | HR 61 | Temp 97.7°F | Resp 16 | Ht 62.5 in | Wt 170.5 lb

## 2021-05-18 DIAGNOSIS — R5382 Chronic fatigue, unspecified: Secondary | ICD-10-CM

## 2021-05-18 DIAGNOSIS — E538 Deficiency of other specified B group vitamins: Secondary | ICD-10-CM | POA: Diagnosis not present

## 2021-05-18 DIAGNOSIS — Z0000 Encounter for general adult medical examination without abnormal findings: Secondary | ICD-10-CM | POA: Diagnosis not present

## 2021-05-18 DIAGNOSIS — Z86010 Personal history of colonic polyps: Secondary | ICD-10-CM

## 2021-05-18 DIAGNOSIS — E669 Obesity, unspecified: Secondary | ICD-10-CM

## 2021-05-18 DIAGNOSIS — E559 Vitamin D deficiency, unspecified: Secondary | ICD-10-CM

## 2021-05-18 NOTE — Assessment & Plan Note (Signed)
Level today  ?Takes 5000 mcg daily  ?

## 2021-05-18 NOTE — Progress Notes (Signed)
? ?Subjective:  ? ? Patient ID: [name], female    DOB: 1980/08/10, 41 y.o.   MRN: [id number] ? ?HPI ?Here for health maintenance exam and to review chronic medical problems   ? ?Wt Readings from Last 3 Encounters:  ?05/18/21 170 lb 8 oz (77.3 kg)  ?03/18/21 169 lb 7 oz (76.9 kg)  ?08/18/20 163 lb 6 oz (74.1 kg)  ? ?30.69 kg/m? ? ?Has gained wt/unhappy about that  ? 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 ?Self care is ok  ?Exercise- 10,000 steps per day and lifts weights 3 times per week and some yoga and pilates  ? ? ? ?Immunization History  ?Administered Date(s) Administered  ? Influenza Split 11/09/2010  ? Influenza-Unspecified 10/25/2012  ? MMR 10/26/2010  ? Td 01/25/1997, 02/06/2008, 02/18/2020  ? Tdap 04/26/2010  ?Declines covid imm ? ?Pap 01/2017 nl with neg HPV ?They did one last year even after hysterectomy  ?H/o endometriosis  ?Then hysterectomy - still has ovaries /took a cyst off  ?Still watching some cysts ?Doing much much better!  ? ?No HRT   ? ? ?Mammogram 07/2020   ?Self breast exams: no lumps  ?Continues to see gyn ongoing  ? ? ?Mother had melanoma  ?Has been to derm 3 weeks ago/all was good  ? ? ?Migraines, takes nurtec  ? ?BP Readings from Last 3 Encounters:  ?05/18/21 102/72  ?03/18/21 94/76  ?08/18/20 134/72  ? ?Pulse Readings from Last 3 Encounters:  ?05/18/21 61  ?03/18/21 74  ?08/18/20 61  ? ?Eats well  ?Vegetarian but eats fish 1-2 times per week/salmon  ?Lot of salad or bowls with rice and veggies  ?Frustrated with weight gain  ?Does not feel perfect because of it  ? ?Protein shakes (fair life)  1 g of sugar  ? ?Pb/almond butter  ?Salmon/tuna  ?Nuts  ?A lot of beans /lentils/chick peas/ pintos  ?Not much dairy - a little cheese  ?Almond milk and oat milk ? ? ?Fatigue  ?Past h/o fibromyalgia and gluten intolarnace  ?She does not eat a lot of gluten - but has some  ? ?Gets  gluten free items when she can  ? ? ?Vit B12 def- 5000 mg SL daily  ?Lab Results  ?Component Value Date  ? [id number] 03/18/2021  ? ?Vit D def-not taking any  ?Level of 40 a year ago  ? ? ?Patient Active Problem List  ? Diagnosis Date Noted  ? Restless legs 03/18/2021  ? Myalgia 03/18/2021  ? Stress reaction 08/18/2020  ? Bruising 01/30/2020  ? Liver lesion 01/15/2020  ? Screening mammogram, encounter for 10/18/2018  ? Vitamin D deficiency 08/22/2018  ? Encounter for routine gynecological examination 02/02/2017  ? Neck pain 11/09/2016  ? Eczema 01/29/2015  ? Health maintenance examination 05/16/2014  ? Anxiety disorder 03/19/2013  ? Left ovarian cyst 03/07/2013  ? ADD (attention deficit disorder) 02/29/2012  ? Fibromyalgia 03/26/2011  ? Myofascial pain 03/19/2011  ? Gluten intolerance 01/22/2011  ? Obesity (BMI 30-39.9) 11/09/2010  ? Routine general medical examination at a health care facility 10/28/2010  ? Fatigue 05/18/2010  ? FIBROCYSTIC BREAST DISEASE 03/27/2010  ? CAVERNOUS HEMANGIOMA, LIVER 12/03/2009  ? PERSONAL HISTORY OF COLONIC POLYPS 12/03/2009  ? ALLERGIC RHINITIS 03/09/2008  ? URINARY FREQUENCY, CHRONIC 01/31/2007  ? Vitamin B12 deficiency 10/28/2006  ?  GERD 04/27/2006  ? RENAL CALCULUS, HX OF 04/27/2006  ? Migraine syndrome 04/27/2006  ? ?Past Medical History:  ?Diagnosis Date  ? ADD (attention deficit disorder)   ? Complication of anesthesia   ? Endometriosis   ? Family history of malignant neoplasm of gastrointestinal tract   ? Fatigue   ? Fibromyalgia   ? GERD (gastroesophageal reflux disease)   ? Kidney stone   ? Migraine   ? Personal history of colonic polyps 06/01/2006  ? sessil serrated adenoma  ? PONV (postoperative nausea and vomiting)   ? Vitamin B12 deficiency   ? ?Past Surgical History:  ?Procedure Laterality Date  ? ABDOMINAL HYSTERECTOMY    ? BREAST SURGERY  2004  ? breast reduction  ? CHOLECYSTECTOMY    ? LAPAROSCOPY  11/2004  ? endometriosis  ? OVARIAN CYST SURGERY    ? REDUCTION  MAMMAPLASTY    ? ROBOTIC ASSISTED TOTAL HYSTERECTOMY Bilateral 04/10/2019  ? Procedure: XI ROBOTIC ASSISTED TOTAL HYSTERECTOMY AND SALPINGECTOMY AND RIGHT OVARIAN CYSTECTOMY;  Surgeon: Lavonia Drafts, MD;  Location: [facility];  Service: Gynecology;  Laterality: Bilateral;  ? TUBAL LIGATION  2008  ? WISDOM TOOTH EXTRACTION    ? with four other teeth as well  ? ?Social History  ? ?Tobacco Use  ? Smoking status: Former  ?  Packs/day: 0.25  ?  Types: Cigarettes  ?  Quit date: 01/25/2005  ?  Years since quitting: 16.3  ? Smokeless tobacco: Never  ?Vaping Use  ? Vaping Use: Never used  ?Substance Use Topics  ? Alcohol use: Yes  ?  Comment: occasionally  ? Drug use: No  ? ?Family History  ?Problem Relation Age of Onset  ? Cervical cancer Mother 62  ? Hypertension Mother   ? Diabetes Mother   ? Kidney cancer Mother 42  ? Cancer Mother   ?     Melanoma  ? Kidney failure Brother 85  ?     s/p transplant ?due to PCKD or renal stones  ? Wilson's disease Father   ?     liver transplant  ? Ulcerative colitis Father   ? Liver disease Father   ? Colon polyps Sister   ? Breast cancer Maternal Aunt   ? Breast cancer Maternal Grandmother   ? Colon cancer Paternal Grandfather   ? Esophageal cancer Neg Hx   ? Stomach cancer Neg Hx   ? Rectal cancer Neg Hx   ? ?Allergies  ?Allergen Reactions  ? Strawberry Extract Anaphylaxis and Hives  ? Clindamycin Hives  ?  angioedema  ? Gabapentin   ?  Dizzy/ worse headache  ? Morphine And Related   ?  Agitated and Angry  ? Penicillins Hives  ?  Angioedema ?Did it involve swelling of the face/tongue/throat, SOB, or low BP? No ?Did it involve sudden or severe rash/hives, skin peeling, or any reaction on the inside of your mouth or nose? No ?Did you need to seek medical attention at a hospital or doctor's office? No ?When did it last happen?   15 or 41 years old ?If all above answers are "NO", may proceed with cephalosporin use. ?  ? Doxycycline Rash  ? Sulfa Antibiotics Rash   ? ?Current Outpatient Medications on File Prior to Visit  ?Medication Sig Dispense Refill  ? fluticasone (FLONASE) 50 MCG/ACT nasal spray Place 2 sprays into both nostrils daily. 16 g 6  ? Rimegepant Sulfate (NURTEC) 75 MG TBDP Take 75 mg by mouth daily as  needed. 30 tablet 1  ? ?No current facility-administered medications on file prior to visit.  ?  ? ?Review of Systems  ?Constitutional:  Positive for fatigue and unexpected weight change. Negative for activity change, appetite change and fever.  ?HENT:  Negative for congestion, ear pain, rhinorrhea, sinus pressure and sore throat.   ?Eyes:  Negative for pain, redness and visual disturbance.  ?Respiratory:  Negative for cough, shortness of breath and wheezing.   ?Cardiovascular:  Negative for chest pain and palpitations.  ?Gastrointestinal:  Negative for abdominal pain, blood in stool, constipation and diarrhea.  ?Endocrine: Negative for polydipsia and polyuria.  ?Genitourinary:  Negative for dysuria, frequency and urgency.  ?Musculoskeletal:  Negative for arthralgias, back pain and myalgias.  ?Skin:  Negative for pallor and rash.  ?Allergic/Immunologic: Negative for environmental allergies.  ?Neurological:  Negative for dizziness, syncope and headaches.  ?Hematological:  Negative for adenopathy. Does not bruise/bleed easily.  ?Psychiatric/Behavioral:  Negative for decreased concentration and dysphoric mood. The patient is not nervous/anxious.   ? ?   ?Objective:  ? Physical Exam ?Constitutional:   ?   General: She is not in acute distress. ?   Appearance: Normal appearance. She is well-developed. She is obese. She is not ill-appearing or diaphoretic.  ?HENT:  ?   Head: Normocephalic and atraumatic.  ?   Right Ear: Tympanic membrane, ear canal and external ear normal.  ?   Left Ear: Tympanic membrane, ear canal and external ear normal.  ?   Nose: Nose normal. No congestion.  ?   Mouth/Throat:  ?   Mouth: Mucous membranes are moist.  ?   Pharynx: Oropharynx is  clear. No posterior oropharyngeal erythema.  ?Eyes:  ?   General: No scleral icterus. ?   Extraocular Movements: Extraocular movements intact.  ?   Conjunctiva/sclera: Conjunctivae normal.  ?   Pupils: Pupils are

## 2021-05-18 NOTE — Patient Instructions (Addendum)
You will be due for mammogram in July  ? ?Schedule that when you get a reminder if you have not already  ? ?Labs here today  ? ?Keep working on [employer] and exercise  ?Try to get most of your carbohydrates from produce (with the exception of white potatoes)  ?Eat less bread/pasta/rice/snack foods/cereals/sweets and other items from the middle of the grocery store (processed carbs) ? ? ?Here is a handout for generic Wegovy for weight loss  ?Take a look and see what you think  ?I want to review labs first  ? ? ?

## 2021-05-18 NOTE — Assessment & Plan Note (Signed)
Level today  ?Not taking any D currently  ?Disc imp to bone and overall health ?

## 2021-05-18 NOTE — Assessment & Plan Note (Signed)
Discussed how this problem influences overall health and the risks it imposes  ?Reviewed plan for weight loss with lower calorie diet (via better food choices and also portion control or program like weight watchers) and exercise building up to or more than 30 minutes 5 days per week including some aerobic activity  ? ?Interested in generic wegovy if ins covers ?Excellent habits with diet and exercise so far  ?Labs pending  ?

## 2021-05-18 NOTE — Assessment & Plan Note (Signed)
Reviewed health habits including diet and exercise and skin cancer prevention ?Reviewed appropriate screening tests for age  ?Also reviewed health mt list, fam hx and immunization status , as well as social and family history   ?See HPI ?Labs ordered  ?Will order colonoscopy for polyps  ?Declines covid imm ?Pap is utd/has had hysterectomy (partial) ?Mammogram utd -pt will schedule in July ?Also gyn appt upcoming  ?utd derm care (mother had melanoma)  ? ?

## 2021-05-18 NOTE — Assessment & Plan Note (Signed)
Pt is due for 10 y colonoscopy due to polyps  ?Will place referral  ?

## 2021-05-19 ENCOUNTER — Encounter: Payer: Self-pay | Admitting: Family Medicine

## 2021-05-19 ENCOUNTER — Other Ambulatory Visit: Payer: Self-pay | Admitting: Family Medicine

## 2021-05-19 LAB — LIPID PANEL
Cholesterol: 204 mg/dL — ABNORMAL HIGH (ref 0–200)
HDL: 96.1 mg/dL (ref >39.00)
LDL Cholesterol: 95 mg/dL (ref 0–99)
NonHDL: 108.07
Total CHOL/HDL Ratio: 2
Triglycerides: 67 mg/dL (ref 0.0–149.0)
VLDL: 13.4 mg/dL (ref 0.0–40.0)

## 2021-05-19 LAB — CBC WITH DIFFERENTIAL/PLATELET
Basophils Absolute: 0.1 10*3/uL (ref 0.0–0.1)
Basophils Relative: 1.1 % (ref 0.0–3.0)
Eosinophils Absolute: 0.1 10*3/uL (ref 0.0–0.7)
Eosinophils Relative: 1.8 % (ref 0.0–5.0)
HCT: 38.8 % (ref 36.0–46.0)
Hemoglobin: 13 g/dL (ref 12.0–15.0)
Lymphocytes Relative: 30.8 % (ref 12.0–46.0)
Lymphs Abs: 2.1 10*3/uL (ref 0.7–4.0)
MCHC: 33.6 g/dL (ref 30.0–36.0)
MCV: 89.7 fl (ref 78.0–100.0)
Monocytes Absolute: 0.7 10*3/uL (ref 0.1–1.0)
Monocytes Relative: 10.3 % (ref 3.0–12.0)
Neutro Abs: 3.8 10*3/uL (ref 1.4–7.7)
Neutrophils Relative %: 56 % (ref 43.0–77.0)
Platelets: 221 10*3/uL (ref 150.0–400.0)
RBC: 4.33 Mil/uL (ref 3.87–5.11)
RDW: 14.1 % (ref 11.5–15.5)
WBC: 6.8 10*3/uL (ref 4.0–10.5)

## 2021-05-19 LAB — COMPREHENSIVE METABOLIC PANEL
ALT: 9 U/L (ref 0–35)
AST: 13 U/L (ref 0–37)
Albumin: 4.6 g/dL (ref 3.5–5.2)
Alkaline Phosphatase: 43 U/L (ref 39–117)
BUN: 10 mg/dL (ref 6–23)
CO2: 27 mEq/L (ref 19–32)
Calcium: 9.4 mg/dL (ref 8.4–10.5)
Chloride: 101 mEq/L (ref 96–112)
Creatinine, Ser: 0.58 mg/dL (ref 0.40–1.20)
GFR: 110.99 mL/min (ref >60.00)
Glucose, Bld: 83 mg/dL (ref 70–99)
Potassium: 3.6 mEq/L (ref 3.5–5.1)
Sodium: 138 mEq/L (ref 135–145)
Total Bilirubin: 0.8 mg/dL (ref 0.2–1.2)
Total Protein: 7.3 g/dL (ref 6.0–8.3)

## 2021-05-19 LAB — VITAMIN B12: Vitamin B-12: 1189 pg/mL — ABNORMAL HIGH (ref 211–911)

## 2021-05-19 LAB — TSH: TSH: 4.02 u[IU]/mL (ref 0.35–5.50)

## 2021-05-19 LAB — VITAMIN D 25 HYDROXY (VIT D DEFICIENCY, FRACTURES): VITD: 37.99 ng/mL (ref 30.00–100.00)

## 2021-05-19 MED ORDER — SEMAGLUTIDE(0.25 OR 0.5MG/DOS) 2 MG/1.5ML ~~LOC~~ SOPN: 0.2500 mg | PEN_INJECTOR | SUBCUTANEOUS | 0 refills | Status: DC

## 2021-05-20 NOTE — Telephone Encounter (Signed)
CPE was on 05/18/21, last filled on 04/07/20 #30 tabs/ 1 refill ?

## 2021-05-23 ENCOUNTER — Telehealth: Payer: Self-pay

## 2021-05-23 NOTE — Telephone Encounter (Signed)
Received approval as below will send patient my chart to make aware.  ? ? ?

## 2021-05-25 ENCOUNTER — Encounter: Payer: Self-pay | Admitting: Family Medicine

## 2021-05-26 NOTE — Telephone Encounter (Signed)
Form in your inbox 

## 2021-05-29 NOTE — Telephone Encounter (Signed)
Dr. Glori Bickers completed form and pt advise it's ready for pick up ?

## 2021-06-15 ENCOUNTER — Other Ambulatory Visit: Payer: Self-pay | Admitting: Family Medicine

## 2021-06-15 ENCOUNTER — Other Ambulatory Visit (HOSPITAL_COMMUNITY): Payer: Self-pay

## 2021-06-15 MED ORDER — SEMAGLUTIDE(0.25 OR 0.5MG/DOS) 2 MG/3ML ~~LOC~~ SOPN
0.5000 mg | PEN_INJECTOR | SUBCUTANEOUS | 3 refills | Status: DC
  Filled 2021-06-15: qty 3, 28d supply, fill #0

## 2021-06-15 NOTE — Telephone Encounter (Signed)
I went up on the dose to 0.5 mg weekly  Hold it if any intolerable side effects  Glad she is doing ok so far  Follow up with me in about a month

## 2021-06-15 NOTE — Telephone Encounter (Signed)
See pt's message on refill:  Doing well on shot only complaint is heartburn with certain food . Down about 7.2 lbs if weighed in morning. Took 4th shot today.   Last filled on 05/19/21 #3 ml with 0 refill

## 2021-06-18 ENCOUNTER — Other Ambulatory Visit (HOSPITAL_COMMUNITY): Payer: Self-pay

## 2021-06-19 ENCOUNTER — Encounter: Payer: Self-pay | Admitting: Family Medicine

## 2021-06-21 ENCOUNTER — Ambulatory Visit
Admission: RE | Admit: 2021-06-21 | Discharge: 2021-06-21 | Disposition: A | Discharge status: Home / Self Care | Payer: Commercial Managed Care - PPO | Source: Ambulatory Visit | Attending: Emergency Medicine | Admitting: Emergency Medicine

## 2021-06-21 VITALS — BP 136/85 | HR 80 | Temp 98.0°F | Resp 18

## 2021-06-21 DIAGNOSIS — H60313 Diffuse otitis externa, bilateral: Secondary | ICD-10-CM | POA: Diagnosis not present

## 2021-06-21 MED ORDER — CIPROFLOXACIN-DEXAMETHASONE 0.3-0.1 % OT SUSP: 4.0000 [drp] | Freq: Two times a day (BID) | OTIC | 0 refills | Status: DC

## 2021-06-21 MED ORDER — LEVOFLOXACIN 500 MG PO TABS: 500.0000 mg | ORAL_TABLET | Freq: Every day | ORAL | 0 refills | Status: DC

## 2021-06-21 NOTE — Discharge Instructions (Addendum)
Ear wicks were placed in both of your ears today with your previous eardrop used to help expand the wax so they will remain in place.  I sent a prescription for a new antibiotic drop to your pharmacy called Ciprodex.  You stated that you have used this particular eardrop before and I agree that it is superior to the one that was sent in for you previously.  Please instill 4 drops twice daily until you see your ear nose and throat specialist.  Because you have redness around your left ear in the front and back and pain radiating down the left side of your neck I believe it is important that we treat you with an oral antibiotic as well.  I sent a prescription for levofloxacin to your pharmacy, please take 1 tablet daily for the next 7 days.  Reach out to your ear nose and throat specialist to see if they can work you in on the 30th or 31st, the ear wick will need to be changed out or removed by the 31st.  Thank you for visiting urgent care today.

## 2021-06-21 NOTE — ED Provider Notes (Signed)
[facility]    CSN: [id number] Arrival date & time: 06/21/21  1350    HISTORY   Chief Complaint  Patient presents with   Ear Fullness    Entered by patient   Otalgia   HPI [name] is a 41 y.o. female. Pt presents with bilateral ear fullness and pain X 3 days with no relief with neomycin polymyxin drops.  Patient reports a history of frequent otitis externa.  Patient denies known water exposure states that usually when her ears are this bad she has to have earwax replaced that the medicine can be fully administered.  Patient states right now, the drops are just running out of her ear but there is nowhere for it to go.  The history is provided by the patient.  Past Medical History:  Diagnosis Date   ADD (attention deficit disorder)    Complication of anesthesia    Endometriosis    Family history of malignant neoplasm of gastrointestinal tract    Fatigue    Fibromyalgia    GERD (gastroesophageal reflux disease)    Kidney stone    Migraine    Personal history of colonic polyps 06/01/2006   sessil serrated adenoma   PONV (postoperative nausea and vomiting)    Vitamin B12 deficiency    Patient Active Problem List   Diagnosis Date Noted   Restless legs 03/18/2021   Myalgia 03/18/2021   Stress reaction 08/18/2020   Bruising 01/30/2020   Liver lesion 01/15/2020   Screening mammogram, encounter for 10/18/2018   Vitamin D deficiency 08/22/2018   Encounter for routine gynecological examination 02/02/2017   Neck pain 11/09/2016   Eczema 01/29/2015   Health maintenance examination 05/16/2014   Anxiety disorder 03/19/2013   Left ovarian cyst 03/07/2013   ADD (attention deficit disorder) 02/29/2012   Fibromyalgia 03/26/2011   Myofascial pain 03/19/2011   Gluten intolerance 01/22/2011   Obesity (BMI 30-39.9) 11/09/2010   Routine general medical examination at a health care facility 10/28/2010   Fatigue 05/18/2010   FIBROCYSTIC BREAST DISEASE 03/27/2010    CAVERNOUS HEMANGIOMA, LIVER 12/03/2009   PERSONAL HISTORY OF COLONIC POLYPS 12/03/2009   ALLERGIC RHINITIS 03/09/2008   URINARY FREQUENCY, CHRONIC 01/31/2007   Vitamin B12 deficiency 10/28/2006   GERD 04/27/2006   RENAL CALCULUS, HX OF 04/27/2006   Migraine syndrome 04/27/2006   Past Surgical History:  Procedure Laterality Date   ABDOMINAL HYSTERECTOMY     BREAST SURGERY  2004   breast reduction   CHOLECYSTECTOMY     LAPAROSCOPY  11/2004   endometriosis   OVARIAN CYST SURGERY     REDUCTION MAMMAPLASTY     ROBOTIC ASSISTED TOTAL HYSTERECTOMY Bilateral 04/10/2019   Procedure: XI ROBOTIC ASSISTED TOTAL HYSTERECTOMY AND SALPINGECTOMY AND RIGHT OVARIAN CYSTECTOMY;  Surgeon: Lavonia Drafts, MD;  Location: [facility];  Service: Gynecology;  Laterality: Bilateral;   TUBAL LIGATION  2008   WISDOM TOOTH EXTRACTION     with four other teeth as well   OB History     Gravida  2   Para  2   Term  1   Preterm  1   AB      Living  2      SAB      IAB      Ectopic      Multiple      Live Births             Home Medications    Prior to Admission medications  Medication Sig Start Date End Date Taking? Authorizing Provider  fluticasone (FLONASE) 50 MCG/ACT nasal spray Place 2 sprays into both nostrils daily. 12/13/20   Hassell Done, Mary-Margaret, FNP  Rimegepant Sulfate (NURTEC) 75 MG TBDP DISSOLVE 1 TABLET ON TONGUE DAILY AS NEEDED. 05/20/21   Tower, Wynelle Fanny, MD  Semaglutide,0.25 or 0.'5MG'$ /DOS, 2 MG/3ML SOPN Inject 0.5 mg into the skin once a week. 06/15/21   Tower, Wynelle Fanny, MD   Family History Family History  Problem Relation Age of Onset   Cervical cancer Mother 38   Hypertension Mother    Diabetes Mother    Kidney cancer Mother 15   Cancer Mother        Melanoma   Kidney failure Brother 22       s/p transplant ?due to PCKD or renal stones   Wilson's disease Father        liver transplant   Ulcerative colitis Father    Liver disease Father     Colon polyps Sister    Breast cancer Maternal Aunt    Breast cancer Maternal Grandmother    Colon cancer Paternal Grandfather    Esophageal cancer Neg Hx    Stomach cancer Neg Hx    Rectal cancer Neg Hx    Social History Social History   Tobacco Use   Smoking status: Former    Packs/day: 0.25    Types: Cigarettes    Quit date: 01/25/2005    Years since quitting: 16.4   Smokeless tobacco: Never  Vaping Use   Vaping Use: Never used  Substance Use Topics   Alcohol use: Yes    Comment: occasionally   Drug use: No   Allergies   Strawberry extract, Clindamycin, Gabapentin, Morphine and related, Penicillins, Doxycycline, and Sulfa antibiotics  Review of Systems Review of Systems Pertinent findings noted in history of present illness.   Physical Exam Triage Vital Signs ED Triage Vitals  Enc Vitals Group     BP 11/21/20 0827 (!) 147/82     Pulse Rate 11/21/20 0827 72     Resp 11/21/20 0827 18     Temp 11/21/20 0827 98.3 F (36.8 C)     Temp Source 11/21/20 0827 Oral     SpO2 11/21/20 0827 98 %     Weight --      Height --      Head Circumference --      Peak Flow --      Pain Score 11/21/20 0826 5     Pain Loc --      Pain Edu? --      Excl. in [place]? --   No data found.  Updated Vital Signs BP 136/85 (BP Location: Left Arm)   Pulse 80   Temp 98 F (36.7 C) (Oral)   Resp 18   LMP 04/03/2019 (Exact Date)   SpO2 97%   Physical Exam Vitals and nursing note reviewed.  Constitutional:      General: She is not in acute distress.    Appearance: Normal appearance. She is not ill-appearing.  HENT:     Head: Normocephalic and atraumatic.     Salivary Glands: Right salivary gland is not diffusely enlarged or tender. Left salivary gland is not diffusely enlarged or tender.     Right Ear: External ear normal. Decreased hearing noted. Drainage, swelling and tenderness present. A middle ear effusion is present.     Left Ear: External ear normal. Decreased hearing noted.  Drainage, swelling and tenderness present. A  middle ear effusion is present.     Ears:     Comments: Visualization of TMs completely obliterated by effusion in both the ears.    Nose: Nose normal. No nasal deformity, septal deviation, mucosal edema, congestion or rhinorrhea.     Right Turbinates: Not enlarged, swollen or pale.     Left Turbinates: Not enlarged, swollen or pale.     Right Sinus: No maxillary sinus tenderness or frontal sinus tenderness.     Left Sinus: No maxillary sinus tenderness or frontal sinus tenderness.     Mouth/Throat:     Lips: Pink. No lesions.     Mouth: Mucous membranes are moist. No oral lesions.     Pharynx: Oropharynx is clear. Uvula midline. No posterior oropharyngeal erythema or uvula swelling.     Tonsils: No tonsillar exudate. 0 on the right. 0 on the left.  Eyes:     General: Lids are normal.        Right eye: No discharge.        Left eye: No discharge.     Extraocular Movements: Extraocular movements intact.     Conjunctiva/sclera: Conjunctivae normal.     Right eye: Right conjunctiva is not injected.     Left eye: Left conjunctiva is not injected.  Neck:     Trachea: Trachea and phonation normal.  Cardiovascular:     Rate and Rhythm: Normal rate and regular rhythm.     Pulses: Normal pulses.     Heart sounds: Normal heart sounds. No murmur heard.   No friction rub. No gallop.  Pulmonary:     Effort: Pulmonary effort is normal. No accessory muscle usage, prolonged expiration or respiratory distress.     Breath sounds: Normal breath sounds. No stridor, decreased air movement or transmitted upper airway sounds. No decreased breath sounds, wheezing, rhonchi or rales.  Chest:     Chest wall: No tenderness.  Musculoskeletal:        General: Normal range of motion.     Cervical back: Normal range of motion and neck supple. Normal range of motion.  Lymphadenopathy:     Cervical: No cervical adenopathy.  Skin:    General: Skin is warm and dry.      Findings: No erythema or rash.  Neurological:     General: No focal deficit present.     Mental Status: She is alert and oriented to person, place, and time.  Psychiatric:        Mood and Affect: Mood normal.        Behavior: Behavior normal.    Visual Acuity Right Eye Distance:   Left Eye Distance:   Bilateral Distance:    Right Eye Near:   Left Eye Near:    Bilateral Near:     UC Couse / Diagnostics / Procedures:    EKG  Radiology No results found.  Procedures Ear Cerumen Removal  Date/Time: 06/22/2021 7:48 AM Performed by: Lynden Oxford Scales, PA-C Authorized by: Lynden Oxford Scales, PA-C   Consent:    Consent obtained:  Verbal   Consent given by:  Patient   Risks, benefits, and alternatives were discussed: yes     Alternatives discussed:  No treatment, delayed treatment, alternative treatment, observation and referral Universal protocol:    Procedure explained and questions answered to patient or proxy's satisfaction: yes     Patient identity confirmed:  Verbally with patient and arm band Procedure details:    Location:  L ear and R ear  Procedure type comment:  Ear wick placement Post-procedure details:    Hearing quality:  Diminished   Procedure completion:  Tolerated Comments:     Ear wick successfully placed into both ears with full expansion and satisfactory placement. (including critical care time)  UC Diagnoses / Final Clinical Impressions(s)   I have reviewed the triage vital signs and the nursing notes.  Pertinent labs & imaging results that were available during my care of the patient were reviewed by me and considered in my medical decision making (see chart for details).   Final diagnoses:  Acute diffuse otitis externa of both ears   Ear wick placed into each ear canal followed by 10 drops of patient's Polytrim drops which she brought with her today.  Patient advised that given poor response to Polytrim that when she leaves the clinic she  should pick up a new prescription for Ciprodex, patient states that she is usually prescribed this and is unsure why she was given Polytrim by her provider.  Patient also advised that given redness and mild swelling around her left ear radiating down her neck, I recommend that she begin a 7-day course of Levaquin.  ED Prescriptions     Medication Sig Dispense Auth. Provider   ciprofloxacin-dexamethasone (CIPRODEX) OTIC suspension Place 4 drops into both ears 2 (two) times daily. 7.5 mL Lynden Oxford Scales, PA-C   levofloxacin (LEVAQUIN) 500 MG tablet Take 1 tablet (500 mg total) by mouth daily. 7 tablet Lynden Oxford Scales, PA-C      PDMP not reviewed this encounter.  Pending results:  Labs Reviewed - No data to display  Medications Ordered in UC: Medications - No data to display  Disposition Upon Discharge:  Condition: stable for discharge home Home: take medications as prescribed; routine discharge instructions as discussed; follow up as advised.  Patient presented with an acute illness with associated systemic symptoms and significant discomfort requiring urgent management. In my opinion, this is a condition that a prudent lay person (someone who possesses an average knowledge of health and medicine) may potentially expect to result in complications if not addressed urgently such as respiratory distress, impairment of bodily function or dysfunction of bodily organs.   Routine symptom specific, illness specific and/or disease specific instructions were discussed with the patient and/or caregiver at length.   As such, the patient has been evaluated and assessed, work-up was performed and treatment was provided in alignment with urgent care protocols and evidence based medicine.  Patient/parent/caregiver has been advised that the patient may require follow up for further testing and treatment if the symptoms continue in spite of treatment, as clinically indicated and  appropriate.  If the patient was tested for COVID-19, Influenza and/or RSV, then the patient/parent/guardian was advised to isolate at home pending the results of his/her diagnostic coronavirus test and potentially longer if they're positive. I have also advised pt that if his/her COVID-19 test returns positive, it's recommended to self-isolate for at least 10 days after symptoms first appeared AND until fever-free for 24 hours without fever reducer AND other symptoms have improved or resolved. Discussed self-isolation recommendations as well as instructions for household member/close contacts as per the [facility] and [place] DHHS, and also gave patient the [place] packet with this information.  Patient/parent/caregiver has been advised to return to the [facility] or PCP in 3-5 days if no better; to PCP or the Emergency Department if new signs and symptoms develop, or if the current signs or symptoms continue to change or worsen  for further workup, evaluation and treatment as clinically indicated and appropriate  The patient will follow up with their current PCP if and as advised. If the patient does not currently have a PCP we will assist them in obtaining one.   The patient may need specialty follow up if the symptoms continue, in spite of conservative treatment and management, for further workup, evaluation, consultation and treatment as clinically indicated and appropriate.  Patient/parent/caregiver verbalized understanding and agreement of plan as discussed.  All questions were addressed during visit.  Please see discharge instructions below for further details of plan.  Discharge Instructions:   Discharge Instructions      Ear wicks were placed in both of your ears today with your previous eardrop used to help expand the wax so they will remain in place.  I sent a prescription for a new antibiotic drop to your pharmacy called Ciprodex.  You stated that you have used this particular eardrop before and I agree  that it is superior to the one that was sent in for you previously.  Please instill 4 drops twice daily until you see your ear nose and throat specialist.  Because you have redness around your left ear in the front and back and pain radiating down the left side of your neck I believe it is important that we treat you with an oral antibiotic as well.  I sent a prescription for levofloxacin to your pharmacy, please take 1 tablet daily for the next 7 days.  Reach out to your ear nose and throat specialist to see if they can work you in on the 30th or 31st, the ear wick will need to be changed out or removed by the 31st.  Thank you for visiting urgent care today.      This office note has been dictated using Museum/gallery curator.  Unfortunately, and despite my best efforts, this method of dictation can sometimes lead to occasional typographical or grammatical errors.  I apologize in advance if this occurs.     Lynden Oxford Scales, [place] 06/22/21 [phone]

## 2021-06-21 NOTE — ED Triage Notes (Signed)
Pt presents with bilateral ear fullness and pain X 3 days with no relief with OTC drops.

## 2021-07-15 ENCOUNTER — Encounter: Payer: Self-pay | Admitting: Family Medicine

## 2021-07-16 ENCOUNTER — Telehealth: Payer: Self-pay

## 2021-07-16 NOTE — Telephone Encounter (Signed)
Prior [name] has been started for [name] (0.25 or 0.5 MG/DOSE) '2MG'$ /3ML pen-injectors. [name] (Key: [id number]) Rx #: [id number] Waiting for determination.

## 2021-07-17 ENCOUNTER — Encounter: Payer: Self-pay | Admitting: Family Medicine

## 2021-07-17 ENCOUNTER — Ambulatory Visit (INDEPENDENT_AMBULATORY_CARE_PROVIDER_SITE_OTHER): Payer: Commercial Managed Care - PPO | Admitting: Family Medicine

## 2021-07-17 VITALS — BP 118/76 | HR 81 | Ht 62.5 in | Wt 160.0 lb

## 2021-07-17 DIAGNOSIS — G43909 Migraine, unspecified, not intractable, without status migrainosus: Secondary | ICD-10-CM

## 2021-07-17 DIAGNOSIS — E669 Obesity, unspecified: Secondary | ICD-10-CM | POA: Diagnosis not present

## 2021-07-17 NOTE — Telephone Encounter (Signed)
Prior authorization for Ozempic (0.25 or 0.5 MG/DOSE) 2 MG/3 ML pen-injectors has been denied. [name] (Key: [id number]) Rx #: [id number]  You or your doctor recently requested prescription coverage for Ozempic (semaglutide).  After careful consideration and review of your prescription plan's drug list and the information sent to [place], this request was not approved.  We understand that this decision may not be what you and your doctor expected.  This letter and the enclosed information will explain your options and help you decide what to do next.  Why your request was denied: Your plan only covers this drug when it is used for certain health conditions.  Covered use is for type 2 diabetes mellitus.  Your plan does not cover the drug for your health condition that your doctor told us you have.  We reviewed the information we had.  Your request has been denied.    Sent denial letter to scanning.

## 2021-07-20 ENCOUNTER — Encounter: Payer: Self-pay | Admitting: Family Medicine

## 2021-07-21 MED ORDER — SEMAGLUTIDE-WEIGHT MANAGEMENT 0.25 MG/0.5ML ~~LOC~~ SOAJ: 0.2500 mg | SUBCUTANEOUS | 3 refills | Status: DC

## 2021-07-22 ENCOUNTER — Telehealth: Payer: Self-pay

## 2021-07-22 NOTE — Telephone Encounter (Signed)
Prior authorization started for [facility] 0.'25MG'$ /0.5ML auto-injectors. Kiannah Daoud (Key: [id number]) Rx #: [id number] Waiting for determination.

## 2021-07-23 NOTE — Telephone Encounter (Signed)
I left a message for the patient to return my call.

## 2021-07-23 NOTE — Telephone Encounter (Signed)
Prior authorization for Wegovy 0.'25MG'$ /0.5ML auto-injectors has been denied. [name] (Key: [id number])  You or your doctor recently requested prescription coverage for Wegovy (semaglutide injection).  After careful consideration and review of your prescription plan's drug list and the information sent to [place], this request was not approved.  We understand that this decision may not be what you and your doctor expected.  This letter and the enclosed information will explain your options and help you decide what to do next.  Why your request was denied: You do not meet the requirements of your plan. Your plan covers this drug when you have at least one weight-related comorbid condition (e.g., hypertension, type 2 diabetes mellitus or dyslipidemia) Your request has been denied based on the information we have.  Denial letter has been sent to scanning.

## 2021-08-03 ENCOUNTER — Encounter: Payer: Self-pay | Admitting: Family Medicine

## 2021-08-03 DIAGNOSIS — E669 Obesity, unspecified: Secondary | ICD-10-CM

## 2021-08-07 ENCOUNTER — Encounter: Payer: Self-pay | Admitting: Family Medicine

## 2021-08-07 DIAGNOSIS — G43909 Migraine, unspecified, not intractable, without status migrainosus: Secondary | ICD-10-CM

## 2021-08-10 MED ORDER — TOPIRAMATE 25 MG PO TABS: 25.0000 mg | ORAL_TABLET | Freq: Two times a day (BID) | ORAL | 1 refills | Status: DC

## 2021-08-13 ENCOUNTER — Telehealth: Payer: Commercial Managed Care - PPO | Admitting: Family Medicine

## 2021-08-13 DIAGNOSIS — R1030 Lower abdominal pain, unspecified: Secondary | ICD-10-CM | POA: Diagnosis not present

## 2021-08-13 NOTE — Patient Instructions (Signed)
  [name], thank you for joining [name], NP for today's virtual visit.  While this provider is not your primary care provider (PCP), if your PCP is located in our provider database this encounter information will be shared with them immediately following your visit.  Consent: (Patient) [name] provided verbal consent for this virtual visit at the beginning of the encounter.  Current Medications:  Current Outpatient Medications:    ciprofloxacin-dexamethasone (CIPRODEX) OTIC suspension, Place 4 drops into both ears 2 (two) times daily., Disp: 7.5 mL, Rfl: 0   fluticasone (FLONASE) 50 MCG/ACT nasal spray, Place 2 sprays into both nostrils daily. (Patient not taking: Reported on 07/17/2021), Disp: 16 g, Rfl: 6   Rimegepant Sulfate (NURTEC) 75 MG TBDP, DISSOLVE 1 TABLET ON TONGUE DAILY AS NEEDED., Disp: 8 tablet, Rfl: 11   Semaglutide-Weight Management 0.25 MG/0.5ML SOAJ, Inject 0.25 mg into the skin once a week., Disp: 3 mL, Rfl: 3   topiramate (TOPAMAX) 25 MG tablet, Take 1 tablet (25 mg total) by mouth 2 (two) times daily., Disp: 60 tablet, Rfl: 1   Medications ordered in this encounter:  No orders of the defined types were placed in this encounter.    *If you need refills on other medications prior to your next appointment, please contact your pharmacy*  Follow-Up: Call back or seek an in-person evaluation if the symptoms worsen or if the condition fails to improve as anticipated.  Other Instructions Please call your OBGYN for follow up   If you have been instructed to have an in-person evaluation today at a local Urgent Care facility, please use the link below. It will take you to a list of all of our available [facility], including address, phone number and hours of operation. Please do not delay care.  [facility]  If you or a family member do not have a primary care provider, use the link below to schedule a visit and establish  care. When you choose a [facility] primary care physician or advanced practice provider, you gain a long-term partner in health. Find a Primary Care Provider  Learn more about [facility]'s in-office and virtual care options: [facility]

## 2021-08-13 NOTE — Progress Notes (Signed)
Virtual Visit Consent   [name], you are scheduled for a virtual visit with a [facility] provider today. Just as with appointments in the office, your consent must be obtained to participate. Your consent will be active for this visit and any virtual visit you may have with one of our providers in the next 365 days. If you have a MyChart account, a copy of this consent can be sent to you electronically.  As this is a virtual visit, video technology does not allow for your provider to perform a traditional examination. This may limit your provider's ability to fully assess your condition. If your provider identifies any concerns that need to be evaluated in person or the need to arrange testing (such as labs, EKG, etc.), we will make arrangements to do so. Although advances in technology are sophisticated, we cannot ensure that it will always work on either your end or our end. If the connection with a video visit is poor, the visit may have to be switched to a telephone visit. With either a video or telephone visit, we are not always able to ensure that we have a secure connection.  By engaging in this virtual visit, you consent to the provision of healthcare and authorize for your insurance to be billed (if applicable) for the services provided during this visit. Depending on your insurance coverage, you may receive a charge related to this service.  I need to obtain your verbal consent now. Are you willing to proceed with your visit today? [name] has provided verbal consent on 08/13/2021 for a virtual visit (video or telephone). [name], NP  Date: 08/13/2021 11:34 AM  Virtual Visit via Video Note   I, [name], connected with  [name]  ([id number], 11-08-1980) on 08/13/21 at 11:30 AM EDT by a video-enabled telemedicine application and verified that I am speaking with the correct person using two identifiers.  Location: Patient: Virtual Visit Location Patient:  Home Provider: Virtual Visit Location Provider: Home Office   I discussed the limitations of evaluation and management by telemedicine and the availability of in person appointments. The patient expressed understanding and agreed to proceed.    History of Present Illness: [name] is a 41 y.o. who identifies as a female who was assigned female at birth, and is being seen today for lower abdomen pain- off and on for several months. Starting to get worse with sitting and walking. Does have constipation at times, but this feels different and no recent constipation to report. Was on ozempic has not taken it in 3-4 weeks. Denies changes in BM or bleeding. Did have hysterectomy in March of 2021- they left ovaries. Does have GYN and will reach out to them. Denies n/v, fevers or chills, no changes in diet.   Problems:  Patient Active Problem List   Diagnosis Date Noted   Restless legs 03/18/2021   Myalgia 03/18/2021   Stress reaction 08/18/2020   Bruising 01/30/2020   Liver lesion 01/15/2020   Screening mammogram, encounter for 10/18/2018   Vitamin D deficiency 08/22/2018   Encounter for routine gynecological examination 02/02/2017   Neck pain 11/09/2016   Eczema 01/29/2015   Health maintenance examination 05/16/2014   Anxiety disorder 03/19/2013   Left ovarian cyst 03/07/2013   ADD (attention deficit disorder) 02/29/2012   Fibromyalgia 03/26/2011   Myofascial pain 03/19/2011   Gluten intolerance 01/22/2011   Obesity (BMI 30-39.9) 11/09/2010   Routine general medical examination at  a health care facility 10/28/2010   Fatigue 05/18/2010   FIBROCYSTIC BREAST DISEASE 03/27/2010   CAVERNOUS HEMANGIOMA, LIVER 12/03/2009   PERSONAL HISTORY OF COLONIC POLYPS 12/03/2009   ALLERGIC RHINITIS 03/09/2008   URINARY FREQUENCY, CHRONIC 01/31/2007   Vitamin B12 deficiency 10/28/2006   GERD 04/27/2006   RENAL CALCULUS, HX OF 04/27/2006   Migraine syndrome 04/27/2006    Allergies:  Allergies   Allergen Reactions   Strawberry Extract Anaphylaxis and Hives   Clindamycin Hives    angioedema   Gabapentin     Dizzy/ worse headache   Morphine And Related     Agitated and Angry   Penicillins Hives    Angioedema Did it involve swelling of the face/tongue/throat, SOB, or low BP? No Did it involve sudden or severe rash/hives, skin peeling, or any reaction on the inside of your mouth or nose? No Did you need to seek medical attention at a hospital or doctor's office? No When did it last happen?   39 or 41 years old If all above answers are "NO", may proceed with cephalosporin use.    Doxycycline Rash   Sulfa Antibiotics Rash   Medications:  Current Outpatient Medications:    ciprofloxacin-dexamethasone (CIPRODEX) OTIC suspension, Place 4 drops into both ears 2 (two) times daily., Disp: 7.5 mL, Rfl: 0   fluticasone (FLONASE) 50 MCG/ACT nasal spray, Place 2 sprays into both nostrils daily. (Patient not taking: Reported on 07/17/2021), Disp: 16 g, Rfl: 6   Rimegepant Sulfate (NURTEC) 75 MG TBDP, DISSOLVE 1 TABLET ON TONGUE DAILY AS NEEDED., Disp: 8 tablet, Rfl: 11   Semaglutide-Weight Management 0.25 MG/0.5ML SOAJ, Inject 0.25 mg into the skin once a week., Disp: 3 mL, Rfl: 3   topiramate (TOPAMAX) 25 MG tablet, Take 1 tablet (25 mg total) by mouth 2 (two) times daily., Disp: 60 tablet, Rfl: 1  Observations/Objective: Patient is well-developed, well-nourished in no acute distress.  Resting comfortably  at home.  Head is normocephalic, atraumatic.  No labored breathing.  Speech is clear and coherent with logical content.  Patient is alert and oriented at baseline.    Assessment and Plan: 1. Lower abdominal pain  -no red flags, but given the on going nature of symptoms she is advised to follow up with GYN -does not appear to be specific in area- no BM changes. -might be adhesions from hysterectomy - ovaries were left -gluten intolerance as well is noted in history (but no GI  symptoms reported)  Past Medical, Surgical, Social History, Allergies, and Medications have been Reviewed.  Patient acknowledged agreement and understanding of the plan.    Follow Up Instructions: I discussed the assessment and treatment plan with the patient. The patient was provided an opportunity to ask questions and all were answered. The patient agreed with the plan and demonstrated an understanding of the instructions.  A copy of instructions were sent to the patient via MyChart unless otherwise noted below.     The patient was advised to call back or seek an in-person evaluation if the symptoms worsen or if the condition fails to improve as anticipated.  Time:  I spent 15 minutes with the patient via telehealth technology discussing the above problems/concerns.    [name], NP

## 2021-08-17 ENCOUNTER — Telehealth: Payer: Self-pay

## 2021-08-17 NOTE — Telephone Encounter (Signed)
Prior auth started for Ozempic (0.25 or 0.5 MG/DOSE) '2MG'$ /3ML pen-injectors. [name] (Key: [id number]) Waiting for determination.

## 2021-08-18 ENCOUNTER — Other Ambulatory Visit: Payer: Self-pay | Admitting: Family Medicine

## 2021-08-18 NOTE — Telephone Encounter (Signed)
Please let her know, it is not covered since she is not diabetic

## 2021-08-18 NOTE — Telephone Encounter (Signed)
Sent a my  chart to patient regarding this

## 2021-08-18 NOTE — Telephone Encounter (Signed)
Forwarding Dr Glori Bickers

## 2021-08-18 NOTE — Telephone Encounter (Signed)
Prior auth for Ozempic (0.25 or 0.5 MG/DOSE) '2MG'$ /3ML pen-injectors has been denied. Sarena Gott (Key: [id number])  You or your doctor recently requested prescription coverage for Ozempic (semaglutide). After careful consideration and review of your prescription plan's drug list and the information sent to [place], this request was not approved.  Why your request was denied: Your plan only covers this drug when it is used for certain health conditions. Covered use is for type 2 diabetes mellitus. Your plan does not cover the drug for your health condition that your doctor told us you have.  Denial letter sent to scanning.

## 2021-08-19 NOTE — Addendum Note (Signed)
Addended by: Loura Pardon A on: 08/19/2021 09:40 PM   Modules accepted: Orders

## 2021-08-21 ENCOUNTER — Ambulatory Visit: Payer: Commercial Managed Care - PPO | Admitting: Family Medicine

## 2021-08-24 ENCOUNTER — Other Ambulatory Visit (INDEPENDENT_AMBULATORY_CARE_PROVIDER_SITE_OTHER): Payer: Commercial Managed Care - PPO

## 2021-08-24 DIAGNOSIS — E669 Obesity, unspecified: Secondary | ICD-10-CM | POA: Diagnosis not present

## 2021-08-24 LAB — POCT GLYCOSYLATED HEMOGLOBIN (HGB A1C): Hemoglobin A1C: 4.8 % (ref 4.0–5.6)

## 2021-09-15 ENCOUNTER — Other Ambulatory Visit: Payer: Self-pay | Admitting: Family Medicine

## 2021-09-15 DIAGNOSIS — Z1231 Encounter for screening mammogram for malignant neoplasm of breast: Secondary | ICD-10-CM

## 2021-09-21 ENCOUNTER — Encounter: Payer: Self-pay | Admitting: Family Medicine

## 2021-09-21 ENCOUNTER — Telehealth: Payer: Self-pay

## 2021-09-21 NOTE — Telephone Encounter (Signed)
Prior auth started for [name] 0.'25MG'$ /0.5ML auto-injectors. [name] (Key: [id number]) Waiting for determination.

## 2021-09-22 NOTE — Telephone Encounter (Signed)
Prior auth for [name] 0.'25MG'$ /0.5ML auto-injectors has been approved. [name] (Key: [id number]) We're pleased to let you know that we've approved your or your doctor's request for coverage for [employer] 0.'25MG'$ /0.5ML [place] SOAJ. You can now fill your prescription, and it will be covered according to your plan. As long as you remain covered by your prescription drug plan and there are no changes to your plan benefits, this request is approved from 09/21/2021 to 04/19/2022.   Approval letter sent to scanning.  Notified patient via mychart.

## 2021-09-24 ENCOUNTER — Ambulatory Visit (INDEPENDENT_AMBULATORY_CARE_PROVIDER_SITE_OTHER): Payer: Commercial Managed Care - PPO | Admitting: Family Medicine

## 2021-09-24 ENCOUNTER — Encounter: Payer: Self-pay | Admitting: Family Medicine

## 2021-09-24 DIAGNOSIS — H109 Unspecified conjunctivitis: Secondary | ICD-10-CM | POA: Insufficient documentation

## 2021-09-24 DIAGNOSIS — H1013 Acute atopic conjunctivitis, bilateral: Secondary | ICD-10-CM | POA: Diagnosis not present

## 2021-09-24 DIAGNOSIS — Z1231 Encounter for screening mammogram for malignant neoplasm of breast: Secondary | ICD-10-CM

## 2021-09-24 MED ORDER — OLOPATADINE HCL 0.2 % OP SOLN: 1.0000 [drp] | Freq: Every day | OPHTHALMIC | 1 refills | Status: DC

## 2021-09-24 NOTE — Assessment & Plan Note (Signed)
Suspect allergic  No fb/ swelling or trauma  Injection is mild  bilat and symmetric Watery but no exudate  Has enc allergies   Px patanol ophy Update if worse or no imp  Disc s/s of bact conjunctivitis to watch for  Also vision change or photophobia Update if not starting to improve in a week or if worsening   Handout given

## 2021-09-24 NOTE — Progress Notes (Signed)
Subjective:    Patient ID: [name], female    DOB: [birth year], 41 y.o.   MRN: [id number]  HPI Pt presents with eye symptoms   Wt Readings from Last 3 Encounters:  09/24/21 169 lb 8 oz (76.9 kg)  07/17/21 160 lb (72.6 kg)  05/18/21 170 lb 8 oz (77.3 kg)   30.51 kg/m  Left eye burned and watered last wk  Thought it was allergies Took an antihistamine   Now both eyes  Worse  Feels like thick mucous coming out  Watering  A little red -laterally   Eye strain/computer makes is hurt more   Does not wear contacts Uses blue light glasses   Just a little blurry from drainage? Unsure   No exp to pink eye No little kids  No head cold symptoms     No new light sensitivity   No skin redness Little break out  Has not been in the sun a lot  Does wear sun screen   Allergies Occ sneezes  Has flonase  Claritin     Not using any eye makeup or products  Has to use ciprodex ear drops for recurrent ear canal problem  Is long term now from ENT for prevention  Ear infections for years  Had a severe one in May    Patient Active Problem List   Diagnosis Date Noted   Conjunctivitis 09/24/2021   Restless legs 03/18/2021   Myalgia 03/18/2021   Stress reaction 08/18/2020   Bruising 01/30/2020   Liver lesion 01/15/2020   Screening mammogram, encounter for 10/18/2018   Vitamin D deficiency 08/22/2018   Encounter for routine gynecological examination 02/02/2017   Neck pain 11/09/2016   Eczema 01/29/2015   Health maintenance examination 05/16/2014   Anxiety disorder 03/19/2013   Left ovarian cyst 03/07/2013   ADD (attention deficit disorder) 02/29/2012   Fibromyalgia 03/26/2011   Myofascial pain 03/19/2011   Gluten intolerance 01/22/2011   Obesity (BMI 30-39.9) 11/09/2010   Routine general medical examination at a health care facility 10/28/2010   Fatigue 05/18/2010   FIBROCYSTIC BREAST DISEASE 03/27/2010   CAVERNOUS HEMANGIOMA, LIVER 12/03/2009   PERSONAL  HISTORY OF COLONIC POLYPS 12/03/2009   ALLERGIC RHINITIS 03/09/2008   URINARY FREQUENCY, CHRONIC 01/31/2007   Vitamin B12 deficiency 10/28/2006   GERD 04/27/2006   RENAL CALCULUS, HX OF 04/27/2006   Migraine syndrome 04/27/2006   Past Medical History:  Diagnosis Date   ADD (attention deficit disorder)    Complication of anesthesia    Endometriosis    Family history of malignant neoplasm of gastrointestinal tract    Fatigue    Fibromyalgia    GERD (gastroesophageal reflux disease)    Kidney stone    Migraine    Personal history of colonic polyps 06/01/2006   sessil serrated adenoma   PONV (postoperative nausea and vomiting)    Vitamin B12 deficiency    Past Surgical History:  Procedure Laterality Date   ABDOMINAL HYSTERECTOMY     BREAST SURGERY  2004   breast reduction   CHOLECYSTECTOMY     LAPAROSCOPY  11/2004   endometriosis   OVARIAN CYST SURGERY     REDUCTION MAMMAPLASTY     ROBOTIC ASSISTED TOTAL HYSTERECTOMY Bilateral 04/10/2019   Procedure: XI ROBOTIC ASSISTED TOTAL HYSTERECTOMY AND SALPINGECTOMY AND RIGHT OVARIAN CYSTECTOMY;  Surgeon: Lavonia Drafts, MD;  Location: [facility];  Service: Gynecology;  Laterality: Bilateral;   TUBAL LIGATION  2008   WISDOM TOOTH EXTRACTION  with four other teeth as well   Social History   Tobacco Use   Smoking status: Former    Packs/day: 0.25    Types: Cigarettes    Quit date: 01/25/2005    Years since quitting: 16.6   Smokeless tobacco: Never  Vaping Use   Vaping Use: Never used  Substance Use Topics   Alcohol use: Yes    Comment: occasionally   Drug use: No   Family History  Problem Relation Age of Onset   Cervical cancer Mother 38   Hypertension Mother    Diabetes Mother    Kidney cancer Mother 83   Cancer Mother        Melanoma   Kidney failure Brother 2       s/p transplant ?due to PCKD or renal stones   Wilson's disease Father        liver transplant   Ulcerative colitis Father     Liver disease Father    Colon polyps Sister    Breast cancer Maternal Aunt    Breast cancer Maternal Grandmother    Colon cancer Paternal Grandfather    Esophageal cancer Neg Hx    Stomach cancer Neg Hx    Rectal cancer Neg Hx    Allergies  Allergen Reactions   Strawberry Extract Anaphylaxis and Hives   Clindamycin Hives    angioedema   Gabapentin     Dizzy/ worse headache   Morphine And Related     Agitated and Angry   Penicillins Hives    Angioedema Did it involve swelling of the face/tongue/throat, SOB, or low BP? No Did it involve sudden or severe rash/hives, skin peeling, or any reaction on the inside of your mouth or nose? No Did you need to seek medical attention at a hospital or doctor's office? No When did it last happen?   14 or 41 years old If all above answers are "NO", may proceed with cephalosporin use.    Doxycycline Rash   Sulfa Antibiotics Rash   Current Outpatient Medications on File Prior to Visit  Medication Sig Dispense Refill   ciprofloxacin-dexamethasone (CIPRODEX) OTIC suspension Place 4 drops into both ears 2 (two) times daily. 7.5 mL 0   fluticasone (FLONASE) 50 MCG/ACT nasal spray Place 2 sprays into both nostrils daily. 16 g 6   Rimegepant Sulfate (NURTEC) 75 MG TBDP DISSOLVE 1 TABLET ON TONGUE DAILY AS NEEDED. 8 tablet 11   Semaglutide-Weight Management 0.25 MG/0.5ML SOAJ Inject 0.25 mg into the skin once a week. (Patient not taking: Reported on 09/24/2021) 3 mL 3   No current facility-administered medications on file prior to visit.     Review of Systems  Constitutional:  Negative for activity change, appetite change, fatigue, fever and unexpected weight change.  HENT:  Negative for congestion, ear pain, rhinorrhea, sinus pressure and sore throat.   Eyes:  Positive for discharge, redness and itching. Negative for pain and visual disturbance.  Respiratory:  Negative for cough, shortness of breath and wheezing.   Cardiovascular:  Negative for  chest pain and palpitations.  Gastrointestinal:  Negative for abdominal pain, blood in stool, constipation and diarrhea.  Endocrine: Negative for polydipsia and polyuria.  Genitourinary:  Negative for dysuria, frequency and urgency.  Musculoskeletal:  Negative for arthralgias, back pain and myalgias.  Skin:  Negative for pallor and rash.  Allergic/Immunologic: Negative for environmental allergies.  Neurological:  Negative for dizziness, syncope and headaches.  Hematological:  Negative for adenopathy. Does not bruise/bleed easily.  Psychiatric/Behavioral:  Negative for decreased concentration and dysphoric mood. The patient is not nervous/anxious.        Objective:   Physical Exam Constitutional:      General: She is not in acute distress.    Appearance: Normal appearance. She is obese. She is not ill-appearing or diaphoretic.  HENT:     Head: Normocephalic and atraumatic.     Right Ear: Tympanic membrane, ear canal and external ear normal.     Left Ear: Tympanic membrane, ear canal and external ear normal.     Nose: No congestion or rhinorrhea.     Mouth/Throat:     Mouth: Mucous membranes are moist.  Eyes:     General: Lids are normal.        Right eye: No foreign body or hordeolum.        Left eye: No foreign body or hordeolum.     Extraocular Movements: Extraocular movements intact.     Right eye: Normal extraocular motion and no nystagmus.     Left eye: Normal extraocular motion and no nystagmus.     Conjunctiva/sclera:     Right eye: Right conjunctiva is injected. No hemorrhage.    Left eye: Left conjunctiva is injected. No hemorrhage.    Pupils: Pupils are equal, round, and reactive to light.     Comments: Mildly increased tearing both eyes  Conj injection worse laterally bilat     Skin:    General: Skin is warm and dry.     Coloration: Skin is not pale.     Findings: Erythema present. No bruising or rash.  Neurological:     Mental Status: She is alert.     Cranial  Nerves: No cranial nerve deficit.  Psychiatric:        Mood and Affect: Mood normal.           Assessment & Plan:   Problem List Items Addressed This Visit       Other   Conjunctivitis    Suspect allergic  No fb/ swelling or trauma  Injection is mild  bilat and symmetric Watery but no exudate  Has enc allergies   Px patanol ophy Update if worse or no imp  Disc s/s of bact conjunctivitis to watch for  Also vision change or photophobia Update if not starting to improve in a week or if worsening   Handout given

## 2021-09-24 NOTE — Patient Instructions (Signed)
Try the pataday drop once daily as needed  Avoid pollen /windy situations when you can  Try not to touch/rub your eyes   If not improved or worse let us know  Watch for increased redness/swelling or any change in vision or light sensitivity

## 2021-09-29 ENCOUNTER — Telehealth: Payer: Commercial Managed Care - PPO | Admitting: Physician Assistant

## 2021-09-29 ENCOUNTER — Ambulatory Visit: Payer: Commercial Managed Care - PPO

## 2021-09-29 ENCOUNTER — Encounter: Payer: Self-pay | Admitting: Family Medicine

## 2021-09-29 DIAGNOSIS — J020 Streptococcal pharyngitis: Secondary | ICD-10-CM | POA: Diagnosis not present

## 2021-09-29 MED ORDER — BENZONATATE 100 MG PO CAPS: 100.0000 mg | ORAL_CAPSULE | Freq: Three times a day (TID) | ORAL | 0 refills | Status: DC | PRN

## 2021-09-29 MED ORDER — AZITHROMYCIN 250 MG PO TABS: ORAL_TABLET | ORAL | 0 refills | Status: AC

## 2021-09-29 NOTE — Patient Instructions (Signed)
  [name], thank you for joining Leeanne Rio, PA-C for today's virtual visit.  While this provider is not your primary care provider (PCP), if your PCP is located in our provider database this encounter information will be shared with them immediately following your visit.  Consent: (Patient) [name] provided verbal consent for this virtual visit at the beginning of the encounter.  Current Medications:  Current Outpatient Medications:    azithromycin (ZITHROMAX) 250 MG tablet, Take 2 tablets on day 1, then 1 tablet daily on days 2 through 5, Disp: 6 tablet, Rfl: 0   benzonatate (TESSALON) 100 MG capsule, Take 1 capsule (100 mg total) by mouth 3 (three) times daily as needed for cough., Disp: 30 capsule, Rfl: 0   ciprofloxacin-dexamethasone (CIPRODEX) OTIC suspension, Place 4 drops into both ears 2 (two) times daily., Disp: 7.5 mL, Rfl: 0   fluticasone (FLONASE) 50 MCG/ACT nasal spray, Place 2 sprays into both nostrils daily., Disp: 16 g, Rfl: 6   Olopatadine HCl 0.2 % SOLN, Apply 1 drop to eye daily., Disp: 2.5 mL, Rfl: 1   Rimegepant Sulfate (NURTEC) 75 MG TBDP, DISSOLVE 1 TABLET ON TONGUE DAILY AS NEEDED., Disp: 8 tablet, Rfl: 11   Semaglutide-Weight Management 0.25 MG/0.5ML SOAJ, Inject 0.25 mg into the skin once a week. (Patient not taking: Reported on 09/24/2021), Disp: 3 mL, Rfl: 3   Medications ordered in this encounter:  Meds ordered this encounter  Medications   azithromycin (ZITHROMAX) 250 MG tablet    Sig: Take 2 tablets on day 1, then 1 tablet daily on days 2 through 5    Dispense:  6 tablet    Refill:  0    Order Specific Question:   Supervising Provider    Answer:   Sabra Heck, BRIAN [[id number]]   benzonatate (TESSALON) 100 MG capsule    Sig: Take 1 capsule (100 mg total) by mouth 3 (three) times daily as needed for cough.    Dispense:  30 capsule    Refill:  0    Order Specific Question:   Supervising Provider    Answer:   Sabra Heck, [place]     *If  you need refills on other medications prior to your next appointment, please contact your pharmacy*  Follow-Up: Call back or seek an in-person evaluation if the symptoms worsen or if the condition fails to improve as anticipated.  Other Instructions Please keep well-hydrated and get plenty of rest. Take the antibiotic as directed. Use the Tessalon as directed, if needed, for dry cough. Ok to continue Tylenol and Ibuprofen. Salt-water gargles and running a humidifier in the bedroom at night can also be beneficial.  If not resolving or you note new/worsening symptoms, please seek an in-person evaluation ASAP.   If you have been instructed to have an in-person evaluation today at a local Urgent Care facility, please use the link below. It will take you to a list of all of our available [facility], including address, phone number and hours of operation. Please do not delay care.  [facility]  If you or a family member do not have a primary care provider, use the link below to schedule a visit and establish care. When you choose a [facility] primary care physician or advanced practice provider, you gain a long-term partner in health. Find a Primary Care Provider  Learn more about [facility]'s in-office and virtual care options: [facility]

## 2021-09-29 NOTE — Progress Notes (Signed)
Virtual Visit Consent   [name], you are scheduled for a virtual visit with a [facility] provider today. Just as with appointments in the office, your consent must be obtained to participate. Your consent will be active for this visit and any virtual visit you may have with one of our providers in the next 365 days. If you have a MyChart account, a copy of this consent can be sent to you electronically.  As this is a virtual visit, video technology does not allow for your provider to perform a traditional examination. This may limit your provider's ability to fully assess your condition. If your provider identifies any concerns that need to be evaluated in person or the need to arrange testing (such as labs, EKG, etc.), we will make arrangements to do so. Although advances in technology are sophisticated, we cannot ensure that it will always work on either your end or our end. If the connection with a video visit is poor, the visit may have to be switched to a telephone visit. With either a video or telephone visit, we are not always able to ensure that we have a secure connection.  By engaging in this virtual visit, you consent to the provision of healthcare and authorize for your insurance to be billed (if applicable) for the services provided during this visit. Depending on your insurance coverage, you may receive a charge related to this service.  I need to obtain your verbal consent now. Are you willing to proceed with your visit today? [name] has provided verbal consent on 09/29/2021 for a virtual visit (video or telephone). Leeanne Rio, [place]  Date: 09/29/2021 3:12 PM  Virtual Visit via Video Note   I, Leeanne Rio, connected with  [name]  ([id number], 12-10-1980) on 09/29/21 at  2:30 PM EDT by a video-enabled telemedicine application and verified that I am speaking with the correct person using two identifiers.  Location: Patient: Virtual Visit Location  Patient: Home Provider: Virtual Visit Location Provider: Home Office   I discussed the limitations of evaluation and management by telemedicine and the availability of in person appointments. The patient expressed understanding and agreed to proceed.    History of Present Illness: [name] is a 41 y.o. who identifies as a female who was assigned female at birth, and is being seen today for several days of worsening sore throat with swollen glands, fatigue, anorexia and low-grade fever. Has noted a slight dry cough today but denies chest or head congestion. Denies recent travel. Took home COVID tests which were negative. Marland Kitchen  HPI: HPI  Problems:  Patient Active Problem List   Diagnosis Date Noted   Conjunctivitis 09/24/2021   Restless legs 03/18/2021   Myalgia 03/18/2021   Stress reaction 08/18/2020   Bruising 01/30/2020   Liver lesion 01/15/2020   Screening mammogram, encounter for 10/18/2018   Vitamin D deficiency 08/22/2018   Encounter for routine gynecological examination 02/02/2017   Neck pain 11/09/2016   Eczema 01/29/2015   Health maintenance examination 05/16/2014   Anxiety disorder 03/19/2013   Left ovarian cyst 03/07/2013   ADD (attention deficit disorder) 02/29/2012   Fibromyalgia 03/26/2011   Myofascial pain 03/19/2011   Gluten intolerance 01/22/2011   Obesity (BMI 30-39.9) 11/09/2010   Routine general medical examination at a health care facility 10/28/2010   Fatigue 05/18/2010   FIBROCYSTIC BREAST DISEASE 03/27/2010   CAVERNOUS HEMANGIOMA, LIVER 12/03/2009   PERSONAL HISTORY OF COLONIC POLYPS 12/03/2009  ALLERGIC RHINITIS 03/09/2008   URINARY FREQUENCY, CHRONIC 01/31/2007   Vitamin B12 deficiency 10/28/2006   GERD 04/27/2006   RENAL CALCULUS, HX OF 04/27/2006   Migraine syndrome 04/27/2006    Allergies:  Allergies  Allergen Reactions   Strawberry Extract Anaphylaxis and Hives   Clindamycin Hives    angioedema   Gabapentin     Dizzy/ worse headache    Morphine And Related     Agitated and Angry   Penicillins Hives    Angioedema Did it involve swelling of the face/tongue/throat, SOB, or low BP? No Did it involve sudden or severe rash/hives, skin peeling, or any reaction on the inside of your mouth or nose? No Did you need to seek medical attention at a hospital or doctor's office? No When did it last happen?   61 or 41 years old If all above answers are "NO", may proceed with cephalosporin use.    Doxycycline Rash   Sulfa Antibiotics Rash   Medications:  Current Outpatient Medications:    azithromycin (ZITHROMAX) 250 MG tablet, Take 2 tablets on day 1, then 1 tablet daily on days 2 through 5, Disp: 6 tablet, Rfl: 0   benzonatate (TESSALON) 100 MG capsule, Take 1 capsule (100 mg total) by mouth 3 (three) times daily as needed for cough., Disp: 30 capsule, Rfl: 0   ciprofloxacin-dexamethasone (CIPRODEX) OTIC suspension, Place 4 drops into both ears 2 (two) times daily., Disp: 7.5 mL, Rfl: 0   fluticasone (FLONASE) 50 MCG/ACT nasal spray, Place 2 sprays into both nostrils daily., Disp: 16 g, Rfl: 6   Olopatadine HCl 0.2 % SOLN, Apply 1 drop to eye daily., Disp: 2.5 mL, Rfl: 1   Rimegepant Sulfate (NURTEC) 75 MG TBDP, DISSOLVE 1 TABLET ON TONGUE DAILY AS NEEDED., Disp: 8 tablet, Rfl: 11   Semaglutide-Weight Management 0.25 MG/0.5ML SOAJ, Inject 0.25 mg into the skin once a week. (Patient not taking: Reported on 09/24/2021), Disp: 3 mL, Rfl: 3  Observations/Objective: Patient is well-developed, well-nourished in no acute distress.  Resting comfortably at home.  Head is normocephalic, atraumatic.  No labored breathing. Speech is clear and coherent with logical content.  Patient is alert and oriented at baseline.  Posterior oropharyngeal erythema and edema noted. Some bilateral tonsillar enlargement noted but cannot appreciate exudate presently.   Assessment and Plan: 1. Strep pharyngitis - azithromycin (ZITHROMAX) 250 MG tablet; Take  2 tablets on day 1, then 1 tablet daily on days 2 through 5  Dispense: 6 tablet; Refill: 0 - benzonatate (TESSALON) 100 MG capsule; Take 1 capsule (100 mg total) by mouth 3 (three) times daily as needed for cough.  Dispense: 30 capsule; Refill: 0  Giving negative COVID. Worsening sore throat in absence of significant URI symptoms and history of strep, will start Azithromycin to cover for this (allergic to penicillins). Tessalon if needed for cough. OTC medications and supportive measures reviewed with patient.   Follow Up Instructions: I discussed the assessment and treatment plan with the patient. The patient was provided an opportunity to ask questions and all were answered. The patient agreed with the plan and demonstrated an understanding of the instructions.  A copy of instructions were sent to the patient via MyChart unless otherwise noted below.   The patient was advised to call back or seek an in-person evaluation if the symptoms worsen or if the condition fails to improve as anticipated.  Time:  I spent 10 minutes with the patient via telehealth technology discussing the above problems/concerns.  Leeanne Rio, PA-C

## 2021-09-30 ENCOUNTER — Encounter: Payer: Self-pay | Admitting: Physician Assistant

## 2021-09-30 NOTE — Telephone Encounter (Signed)
I have taken care of it. Thank you for messaging me.

## 2021-10-15 ENCOUNTER — Ambulatory Visit
Admission: RE | Admit: 2021-10-15 | Discharge: 2021-10-15 | Disposition: A | Discharge status: Home / Self Care | Payer: Commercial Managed Care - PPO | Source: Ambulatory Visit | Attending: Family Medicine | Admitting: Family Medicine

## 2021-10-15 DIAGNOSIS — Z1231 Encounter for screening mammogram for malignant neoplasm of breast: Secondary | ICD-10-CM

## 2021-10-27 ENCOUNTER — Ambulatory Visit (INDEPENDENT_AMBULATORY_CARE_PROVIDER_SITE_OTHER): Payer: Commercial Managed Care - PPO | Admitting: Family Medicine

## 2021-10-27 ENCOUNTER — Encounter: Payer: Self-pay | Admitting: Family Medicine

## 2021-10-27 VITALS — BP 130/78 | HR 85 | Temp 97.1°F | Ht 62.5 in | Wt 164.5 lb

## 2021-10-27 DIAGNOSIS — J301 Allergic rhinitis due to pollen: Secondary | ICD-10-CM

## 2021-10-27 DIAGNOSIS — E669 Obesity, unspecified: Secondary | ICD-10-CM | POA: Diagnosis not present

## 2021-10-27 MED ORDER — SEMAGLUTIDE-WEIGHT MANAGEMENT 0.5 MG/0.5ML ~~LOC~~ SOAJ: 0.5000 mg | SUBCUTANEOUS | 3 refills | Status: DC

## 2021-10-27 NOTE — Assessment & Plan Note (Signed)
Hayfever/rag weed allergy is bothersome in the fall Recommended she continue flonase daily through the season  Add antihistamine (claritin or other) when needed  Avoid allergens when able

## 2021-10-27 NOTE — Progress Notes (Signed)
Subjective:    Patient ID: [name], female    DOB: September 20, 1980, 41 y.o.   MRN: [id number]  HPI Pt presents for f/u of Wegovy tx for obesity   Wt Readings from Last 3 Encounters:  10/27/21 164 lb 8 oz (74.6 kg)  09/24/21 169 lb 8 oz (76.9 kg)  07/17/21 160 lb (72.6 kg)   29.61 kg/m  Doing well  Taking care of herself after she had strep then a cold Better now   Hay fever is bothering her some   Is on the semaglutide (wt man)  She is adjusting  Has lost 5 lb  Controls appetite mostly in the am   No changes in mood    Eats very well  Eats less   More stomach upset- heartburn (was worse initially but improved now)  Has to watch out for greasy foods  Some loose stool  Exercise- weights [employer] training 3 d per week At least 10,000 steps per day for cardio   Off track when she was sick - had to hold off on exercise    Lab Results  Component Value Date   HGBA1C 4.8 08/24/2021   BP Readings from Last 3 Encounters:  10/27/21 130/78  09/24/21 124/82  07/17/21 118/76   Pulse Readings from Last 3 Encounters:  10/27/21 85  09/24/21 67  07/17/21 81    Patient Active Problem List   Diagnosis Date Noted   Conjunctivitis 09/24/2021   Restless legs 03/18/2021   Myalgia 03/18/2021   Stress reaction 08/18/2020   Bruising 01/30/2020   Liver lesion 01/15/2020   Screening mammogram, encounter for 10/18/2018   Vitamin D deficiency 08/22/2018   Encounter for routine gynecological examination 02/02/2017   Neck pain 11/09/2016   Eczema 01/29/2015   Health maintenance examination 05/16/2014   Anxiety disorder 03/19/2013   Left ovarian cyst 03/07/2013   ADD (attention deficit disorder) 02/29/2012   Fibromyalgia 03/26/2011   Myofascial pain 03/19/2011   Gluten intolerance 01/22/2011   Obesity (BMI 30-39.9) 11/09/2010   Routine general medical examination at a health care facility 10/28/2010   Fatigue 05/18/2010   FIBROCYSTIC BREAST DISEASE 03/27/2010    CAVERNOUS HEMANGIOMA, LIVER 12/03/2009   PERSONAL HISTORY OF COLONIC POLYPS 12/03/2009   Allergic rhinitis 03/09/2008   URINARY FREQUENCY, CHRONIC 01/31/2007   Vitamin B12 deficiency 10/28/2006   GERD 04/27/2006   RENAL CALCULUS, HX OF 04/27/2006   Migraine syndrome 04/27/2006   Past Medical History:  Diagnosis Date   ADD (attention deficit disorder)    Complication of anesthesia    Endometriosis    Family history of malignant neoplasm of gastrointestinal tract    Fatigue    Fibromyalgia    GERD (gastroesophageal reflux disease)    Kidney stone    Migraine    Personal history of colonic polyps 06/01/2006   sessil serrated adenoma   PONV (postoperative nausea and vomiting)    Vitamin B12 deficiency    Past Surgical History:  Procedure Laterality Date   ABDOMINAL HYSTERECTOMY     BREAST SURGERY  2004   breast reduction   CHOLECYSTECTOMY     LAPAROSCOPY  11/2004   endometriosis   OVARIAN CYST SURGERY     REDUCTION MAMMAPLASTY     ROBOTIC ASSISTED TOTAL HYSTERECTOMY Bilateral 04/10/2019   Procedure: XI ROBOTIC ASSISTED TOTAL HYSTERECTOMY AND SALPINGECTOMY AND RIGHT OVARIAN CYSTECTOMY;  Surgeon: Lavonia Drafts, MD;  Location: [facility];  Service: Gynecology;  Laterality: Bilateral;   TUBAL LIGATION  2008   WISDOM TOOTH EXTRACTION     with four other teeth as well   Social History   Tobacco Use   Smoking status: Former    Packs/day: 0.25    Types: Cigarettes    Quit date: 01/25/2005    Years since quitting: 16.7   Smokeless tobacco: Never  Vaping Use   Vaping Use: Never used  Substance Use Topics   Alcohol use: Yes    Comment: occasionally   Drug use: No   Family History  Problem Relation Age of Onset   Cervical cancer Mother 78   Hypertension Mother    Diabetes Mother    Kidney cancer Mother 69   Cancer Mother        Melanoma   Kidney failure Brother 16       s/p transplant ?due to PCKD or renal stones   Wilson's disease Father         liver transplant   Ulcerative colitis Father    Liver disease Father    Colon polyps Sister    Breast cancer Maternal Aunt    Breast cancer Maternal Grandmother    Colon cancer Paternal Grandfather    Esophageal cancer Neg Hx    Stomach cancer Neg Hx    Rectal cancer Neg Hx    Allergies  Allergen Reactions   Strawberry Extract Anaphylaxis and Hives   Clindamycin Hives    angioedema   Gabapentin     Dizzy/ worse headache   Morphine And Related     Agitated and Angry   Penicillins Hives    Angioedema Did it involve swelling of the face/tongue/throat, SOB, or low BP? No Did it involve sudden or severe rash/hives, skin peeling, or any reaction on the inside of your mouth or nose? No Did you need to seek medical attention at a hospital or doctor's office? No When did it last happen?   8 or 41 years old If all above answers are "NO", may proceed with cephalosporin use.    Doxycycline Rash   Sulfa Antibiotics Rash   Current Outpatient Medications on File Prior to Visit  Medication Sig Dispense Refill   fluticasone (FLONASE) 50 MCG/ACT nasal spray Place 2 sprays into both nostrils daily. 16 g 6   Olopatadine HCl 0.2 % SOLN Apply 1 drop to eye daily. 2.5 mL 1   Rimegepant Sulfate (NURTEC) 75 MG TBDP DISSOLVE 1 TABLET ON TONGUE DAILY AS NEEDED. 8 tablet 11   No current facility-administered medications on file prior to visit.     Review of Systems  Constitutional:  Negative for activity change, appetite change, fatigue, fever and unexpected weight change.  HENT:  Positive for congestion, rhinorrhea and sneezing. Negative for ear pain, sinus pressure and sore throat.   Eyes:  Negative for pain, redness and visual disturbance.  Respiratory:  Negative for cough, shortness of breath and wheezing.   Cardiovascular:  Negative for chest pain and palpitations.  Gastrointestinal:  Negative for abdominal pain, blood in stool, constipation and diarrhea.       Heartburn intermittently    Endocrine: Negative for polydipsia and polyuria.  Genitourinary:  Negative for dysuria, frequency and urgency.  Musculoskeletal:  Negative for arthralgias, back pain and myalgias.  Skin:  Negative for pallor and rash.  Allergic/Immunologic: Negative for environmental allergies.  Neurological:  Negative for dizziness, syncope and headaches.  Hematological:  Negative for adenopathy. Does not bruise/bleed easily.  Psychiatric/Behavioral:  Negative for decreased concentration and dysphoric mood. The patient  is not nervous/anxious.        Objective:   Physical Exam Constitutional:      General: She is not in acute distress.    Appearance: Normal appearance. She is well-developed. She is obese. She is not ill-appearing.  HENT:     Head: Normocephalic and atraumatic.     Nose: Rhinorrhea present.     Mouth/Throat:     Mouth: Mucous membranes are moist.     Pharynx: Oropharynx is clear. No oropharyngeal exudate or posterior oropharyngeal erythema.  Eyes:     General: No scleral icterus.    Conjunctiva/sclera: Conjunctivae normal.     Pupils: Pupils are equal, round, and reactive to light.  Neck:     Thyroid: No thyromegaly.     Vascular: No carotid bruit or JVD.  Cardiovascular:     Rate and Rhythm: Normal rate and regular rhythm.     Heart sounds: Normal heart sounds.     No gallop.  Pulmonary:     Effort: Pulmonary effort is normal. No respiratory distress.     Breath sounds: Normal breath sounds. No wheezing or rales.  Abdominal:     General: There is no distension or abdominal bruit.     Palpations: Abdomen is soft.  Musculoskeletal:     Cervical back: Normal range of motion and neck supple.     Right lower leg: No edema.     Left lower leg: No edema.  Lymphadenopathy:     Cervical: No cervical adenopathy.  Skin:    General: Skin is warm and dry.     Coloration: Skin is not pale.     Findings: No rash.  Neurological:     Mental Status: She is alert.     Coordination:  Coordination normal.     Deep Tendon Reflexes: Reflexes are normal and symmetric. Reflexes normal.  Psychiatric:        Mood and Affect: Mood normal.           Assessment & Plan:   Problem List Items Addressed This Visit       Respiratory   Allergic rhinitis    Hayfever/rag weed allergy is bothersome in the fall Recommended she continue flonase daily through the season  Add antihistamine (claritin or other) when needed  Avoid allergens when able         Other   Obesity (BMI 30-39.9) - Primary    Doing well overall now back on semaglutide Now brand name wegovy- is tolerating fairly and able to afford with [place]  Some heartburn-that has improved  Willing to try inc dose to 0.5 mg with close obs  Doing well with exercise (cardiol and resistance)  Diet continues to improve  Mood is good/pleased  Will check in at 1 mo on 0.5 mg dose and consider going up again at that time . Discussed how this problem influences overall health and the risks it imposes  Reviewed plan for weight loss with lower calorie diet (via better food choices and also portion control or program like weight watchers) and exercise building up to or more than 30 minutes 5 days per week including some aerobic activity         Relevant Medications   Semaglutide-Weight Management 0.5 MG/0.5ML SOAJ

## 2021-10-27 NOTE — Patient Instructions (Addendum)
Stick with both antihistamine and flonase every day until ragweed is done   Take care of yourself  Keep working on healthy diet and exercise   Go up on the semaglutide to 0.5 mg weekly next week If not tolerated please let us know   Let us know how you do with this after a month

## 2021-10-27 NOTE — Assessment & Plan Note (Signed)
Doing well overall now back on semaglutide Now brand name wegovy- is tolerating fairly and able to afford with [place]  Some heartburn-that has improved  Willing to try inc dose to 0.5 mg with close obs  Doing well with exercise (cardiol and resistance)  Diet continues to improve  Mood is good/pleased  Will check in at 1 mo on 0.5 mg dose and consider going up again at that time . Discussed how this problem influences overall health and the risks it imposes  Reviewed plan for weight loss with lower calorie diet (via better food choices and also portion control or program like weight watchers) and exercise building up to or more than 30 minutes 5 days per week including some aerobic activity

## 2021-10-28 ENCOUNTER — Encounter: Payer: Self-pay | Admitting: Obstetrics & Gynecology

## 2021-10-28 ENCOUNTER — Ambulatory Visit (INDEPENDENT_AMBULATORY_CARE_PROVIDER_SITE_OTHER): Payer: Commercial Managed Care - PPO | Admitting: Obstetrics & Gynecology

## 2021-10-28 VITALS — BP 128/78 | HR 71 | Ht 62.5 in | Wt 166.0 lb

## 2021-10-28 DIAGNOSIS — Z01419 Encounter for gynecological examination (general) (routine) without abnormal findings: Secondary | ICD-10-CM

## 2021-10-28 NOTE — Progress Notes (Signed)
Subjective:     [name] is a 41 y.o. female here for a routine exam.  Current complaints: Pt reports some weight gain and fatigue. She was recently started on Wegovy by her primary care provider. Her HgbA1c was WNL.   Pt iw s/p RATH in 03/2019. Feels so much better than prior to the hyst.      Gynecologic History Patient's last menstrual period was 04/03/2019 (exact date). Contraception: status post hysterectomy Last Pap: 2021. Results were: normal Last mammogram: 09/2019. Results were: normal  Obstetric History OB History  Gravida Para Term Preterm AB Living  '2 2 1 1   2  '$ SAB IAB Ectopic Multiple Live Births          2    # Outcome Date GA Lbr Len/2nd Weight Sex Delivery Anes PTL Lv  2 Term  [redacted]w[redacted]d   Vag-Spont   LIV  1 Preterm  344w0d  Vag-Spont  Y LIV     The following portions of the patient's history were reviewed and updated as appropriate: allergies, current medications, past family history, past medical history, past social history, past surgical history, and problem list.  Review of Systems Pertinent items are noted in HPI.    Objective:  BP 128/78   Pulse 71   Ht 5' 2.5" (1.588 m)   Wt 166 lb (75.3 kg)   LMP 04/03/2019 (Exact Date)   BMI 29.88 kg/m   General Appearance:    Alert, cooperative, no distress, appears stated age  Head:    Normocephalic, without obvious abnormality, atraumatic  Eyes:    conjunctiva/corneas clear, EOM's intact, both eyes  Ears:    Normal external ear canals, both ears  Nose:   Nares normal, septum midline, mucosa normal, no drainage    or sinus tenderness  Throat:   Lips, mucosa, and tongue normal; teeth and gums normal  Neck:   Supple, symmetrical, trachea midline, no adenopathy;    thyroid:  no enlargement/tenderness/nodules  Back:     Symmetric, no curvature, ROM normal, no CVA tenderness  Lungs:     respirations unlabored  Chest Wall:    No tenderness or deformity   Heart:    Regular rate and rhythm  Breast Exam:    No  tenderness, masses, or nipple abnormality  Abdomen:     Soft, non-tender, bowel sounds active all four quadrants,    no masses, no organomegaly  Genitalia:    Normal female without lesion, discharge or tenderness     Extremities:   Extremities normal, atraumatic, no cyanosis or edema  Pulses:   2+ and symmetric all extremities  Skin:   Skin color, texture, turgor normal, no rashes or lesions     Assessment:    Healthy female exam.  Fatigue- reviewed possible causes. Pt will observe to see if fatigue resolves with weight loss. She is taking 10k steps per day.   Plan:   F/u in 1 year or sooner prn  Annual mammogram  Colonoscopy at age [age]years  [name] L. HaIhor DowM.D., FAJacksonville

## 2021-11-09 ENCOUNTER — Encounter (HOSPITAL_BASED_OUTPATIENT_CLINIC_OR_DEPARTMENT_OTHER): Payer: Self-pay | Admitting: Emergency Medicine

## 2021-11-09 ENCOUNTER — Emergency Department (HOSPITAL_BASED_OUTPATIENT_CLINIC_OR_DEPARTMENT_OTHER): Payer: Commercial Managed Care - PPO

## 2021-11-09 ENCOUNTER — Other Ambulatory Visit: Payer: Self-pay

## 2021-11-09 ENCOUNTER — Emergency Department (HOSPITAL_BASED_OUTPATIENT_CLINIC_OR_DEPARTMENT_OTHER)
Admission: EM | Admit: 2021-11-09 | Discharge: 2021-11-09 | Disposition: A | Discharge status: Home / Self Care | Payer: Commercial Managed Care - PPO | Attending: Emergency Medicine | Admitting: Emergency Medicine

## 2021-11-09 ENCOUNTER — Encounter: Payer: Self-pay | Admitting: *Deleted

## 2021-11-09 ENCOUNTER — Ambulatory Visit
Admission: RE | Admit: 2021-11-09 | Discharge: 2021-11-09 | Disposition: A | Discharge status: Home / Self Care | Payer: Commercial Managed Care - PPO | Source: Ambulatory Visit | Attending: Family Medicine | Admitting: Family Medicine

## 2021-11-09 VITALS — BP 134/85 | HR 86 | Resp 18

## 2021-11-09 DIAGNOSIS — Z794 Long term (current) use of insulin: Secondary | ICD-10-CM | POA: Insufficient documentation

## 2021-11-09 DIAGNOSIS — S39012A Strain of muscle, fascia and tendon of lower back, initial encounter: Secondary | ICD-10-CM

## 2021-11-09 DIAGNOSIS — S0990XA Unspecified injury of head, initial encounter: Secondary | ICD-10-CM

## 2021-11-09 DIAGNOSIS — W19XXXA Unspecified fall, initial encounter: Secondary | ICD-10-CM | POA: Diagnosis not present

## 2021-11-09 DIAGNOSIS — S060X9A Concussion with loss of consciousness of unspecified duration, initial encounter: Secondary | ICD-10-CM | POA: Diagnosis not present

## 2021-11-09 DIAGNOSIS — W109XXA Fall (on) (from) unspecified stairs and steps, initial encounter: Secondary | ICD-10-CM | POA: Insufficient documentation

## 2021-11-09 DIAGNOSIS — S161XXA Strain of muscle, fascia and tendon at neck level, initial encounter: Secondary | ICD-10-CM

## 2021-11-09 NOTE — Discharge Instructions (Signed)
Please go to the emergency department as soon as you leave urgent care for further evaluation and management. ?

## 2021-11-09 NOTE — ED Notes (Signed)
Patient is being discharged from the Urgent Care and sent to the Emergency Department via private vehicle . Per [place], PA, patient is in need of higher level of care due to fall/[facility] with +LOC 2 days ago and vomiting. Patient is aware and verbalizes understanding of plan of care.  Vitals:   11/09/21 1628  BP: 134/85  Pulse: 86  Resp: 18  SpO2: 98%

## 2021-11-09 NOTE — ED Provider Notes (Signed)
[facility] DEPARTMENT Provider Note   CSN: [id number] Arrival date & time: 11/09/21  1742     History  Chief Complaint  Patient presents with   Fall    [name] is a 41 y.o. female.   Fall   Patient was at a concert and she ended up falling down some stairs hitting her head and back on cement steps.  This occurred 2 days ago.  Patient did have brief loss of consciousness.  Since then she has had trouble with headache and feeling lightheaded.  She feels like she has to strain with her eyes.  She has some pain in her neck and lower back.  She denies any vomiting.  No lacerations.  She went to urgent care who suggested she come to the ED.    Home Medications Prior to Admission medications   Medication Sig Start Date End Date Taking? Authorizing Provider  fluticasone (FLONASE) 50 MCG/ACT nasal spray Place 2 sprays into both nostrils daily. 12/13/20   Hassell Done, Mary-Margaret, FNP  Olopatadine HCl 0.2 % SOLN Apply 1 drop to eye daily. 09/24/21   Tower, Wynelle Fanny, MD  Rimegepant Sulfate (NURTEC) 75 MG TBDP DISSOLVE 1 TABLET ON TONGUE DAILY AS NEEDED. 05/20/21   Tower, Wynelle Fanny, MD  Semaglutide-Weight Management 0.5 MG/0.5ML SOAJ Inject 0.5 mg into the skin once a week. 10/27/21   Tower, Wynelle Fanny, MD      Allergies    Strawberry extract, Clindamycin, Gabapentin, Morphine and related, Penicillins, Doxycycline, and Sulfa antibiotics    Review of Systems   Review of Systems  Physical Exam Updated Vital Signs BP (!) 139/91 (BP Location: Left Arm)   Pulse 77   Temp 98.5 F (36.9 C) (Oral)   Resp 16   Ht 1.588 m (5' 2.5")   Wt 72.6 kg   LMP 04/03/2019 (Exact Date)   SpO2 99%   BMI 28.80 kg/m  Physical Exam Vitals and nursing note reviewed.  Constitutional:      General: She is not in acute distress.    Appearance: She is well-developed.  HENT:     Head: Normocephalic and atraumatic.     Right Ear: External ear normal.     Left Ear: External ear  normal.  Eyes:     General: No scleral icterus.       Right eye: No discharge.        Left eye: No discharge.     Conjunctiva/sclera: Conjunctivae normal.  Neck:     Trachea: No tracheal deviation.  Cardiovascular:     Rate and Rhythm: Normal rate and regular rhythm.  Pulmonary:     Effort: Pulmonary effort is normal. No respiratory distress.     Breath sounds: Normal breath sounds. No stridor. No wheezing or rales.  Abdominal:     General: Bowel sounds are normal. There is no distension.     Palpations: Abdomen is soft.     Tenderness: There is no abdominal tenderness. There is no guarding or rebound.  Musculoskeletal:        General: No deformity.     Cervical back: Neck supple. Tenderness present.     Thoracic back: No tenderness.     Lumbar back: Tenderness present.  Skin:    General: Skin is warm and dry.     Findings: No rash.  Neurological:     General: No focal deficit present.     Mental Status: She is alert.     Cranial Nerves:  No cranial nerve deficit (no facial droop, extraocular movements intact, no slurred speech).     Sensory: No sensory deficit.     Motor: No abnormal muscle tone or seizure activity.     Coordination: Coordination normal.  Psychiatric:        Mood and Affect: Mood normal.     ED Results / Procedures / Treatments   Labs (all labs ordered are listed, but only abnormal results are displayed) Labs Reviewed - No data to display  EKG None  Radiology CT Head Wo Contrast  Result Date: 11/09/2021 CLINICAL DATA:  Mechanical fall onto cement steps 2 days ago, lost consciousness, headache, dizziness, neck pain EXAM: CT HEAD WITHOUT CONTRAST CT CERVICAL SPINE WITHOUT CONTRAST TECHNIQUE: Multidetector CT imaging of the head and cervical spine was performed following the standard protocol without intravenous contrast. Multiplanar CT image reconstructions of the cervical spine were also generated. RADIATION DOSE REDUCTION: This exam was performed  according to the departmental dose-optimization program which includes automated exposure control, adjustment of the mA and/or kV according to patient size and/or use of iterative reconstruction technique. COMPARISON:  No prior CT head, correlation is made with MRI head 05/20/2008; no prior CT cervical spine, correlation is made with cervical spine radiographs 11/10/2016 FINDINGS: CT HEAD FINDINGS Brain: No evidence of acute infarct, hemorrhage, mass, mass effect, or midline shift. No hydrocephalus or extra-axial fluid collection. Vascular: No hyperdense vessel. Skull: Normal. Negative for fracture or focal lesion. Sinuses/Orbits: No acute finding. Other: The mastoid air cells are well aerated. CT CERVICAL SPINE FINDINGS Alignment: No listhesis. Skull base and vertebrae: No acute fracture. No primary bone lesion or focal pathologic process. Soft tissues and spinal canal: No prevertebral fluid or swelling. No visible canal hematoma. Disc levels:  Disc heights are preserved.  No spinal canal stenosis. Upper chest: Negative. Other: None. IMPRESSION: 1. No acute intracranial process. 2. No acute fracture or traumatic listhesis in the cervical spine. Electronically Signed   By: Merilyn Baba M.D.   On: 11/09/2021 19:48   CT Cervical Spine Wo Contrast  Result Date: 11/09/2021 CLINICAL DATA:  Mechanical fall onto cement steps 2 days ago, lost consciousness, headache, dizziness, neck pain EXAM: CT HEAD WITHOUT CONTRAST CT CERVICAL SPINE WITHOUT CONTRAST TECHNIQUE: Multidetector CT imaging of the head and cervical spine was performed following the standard protocol without intravenous contrast. Multiplanar CT image reconstructions of the cervical spine were also generated. RADIATION DOSE REDUCTION: This exam was performed according to the departmental dose-optimization program which includes automated exposure control, adjustment of the mA and/or kV according to patient size and/or use of iterative reconstruction  technique. COMPARISON:  No prior CT head, correlation is made with MRI head 05/20/2008; no prior CT cervical spine, correlation is made with cervical spine radiographs 11/10/2016 FINDINGS: CT HEAD FINDINGS Brain: No evidence of acute infarct, hemorrhage, mass, mass effect, or midline shift. No hydrocephalus or extra-axial fluid collection. Vascular: No hyperdense vessel. Skull: Normal. Negative for fracture or focal lesion. Sinuses/Orbits: No acute finding. Other: The mastoid air cells are well aerated. CT CERVICAL SPINE FINDINGS Alignment: No listhesis. Skull base and vertebrae: No acute fracture. No primary bone lesion or focal pathologic process. Soft tissues and spinal canal: No prevertebral fluid or swelling. No visible canal hematoma. Disc levels:  Disc heights are preserved.  No spinal canal stenosis. Upper chest: Negative. Other: None. IMPRESSION: 1. No acute intracranial process. 2. No acute fracture or traumatic listhesis in the cervical spine. Electronically Signed   By: Bryson Ha  Vasan M.D.   On: 11/09/2021 19:48   DG Lumbar Spine Complete  Result Date: 11/09/2021 CLINICAL DATA:  Fall with pain EXAM: LUMBAR SPINE - COMPLETE 4+ VIEW COMPARISON:  None Available. FINDINGS: Suspected transitional anatomy. Six non rib-bearing lumbar type vertebra. Lumbar alignment within normal limits. Vertebral body heights and disc spaces appear normal IMPRESSION: Negative. Electronically Signed   By: Donavan Foil M.D.   On: 11/09/2021 19:45    Procedures Procedures    Medications Ordered in ED Medications - No data to display  ED Course/ Medical Decision Making/ A&P                           Medical Decision Making Problems Addressed: Acute strain of neck muscle, initial encounter: acute illness or injury Concussion with loss of consciousness, initial encounter: acute illness or injury Strain of lumbar region, initial encounter: acute illness or injury  Amount and/or Complexity of Data  Reviewed Radiology: ordered and independent interpretation performed.   Patient presented with persistent headache neck pain low back pain after recent fall.  Concerned about the possibility of cerebral hemorrhage with her symptoms.  Fortunately CT scan does not show any signs of serious injury.  Presentation is consistent with concussion.  Neck and back pain consistent with strain.  Patient states she does not need any medications for pain.  She is fine with over-the-counter medications.  Evaluation and diagnostic testing in the emergency department does not suggest an emergent condition requiring admission or immediate intervention beyond what has been performed at this time.  The patient is safe for discharge and has been instructed to return immediately for worsening symptoms, change in symptoms or any other concerns.        Final Clinical Impression(s) / ED Diagnoses Final diagnoses:  Concussion with loss of consciousness, initial encounter  Acute strain of neck muscle, initial encounter  Strain of lumbar region, initial encounter    Rx / DC Orders ED Discharge Orders     None         Dorie Rank, MD 11/09/21 2016

## 2021-11-09 NOTE — Discharge Instructions (Addendum)
The x-rays did not show any signs of internal bleeding or fractures.  Take medications as needed for pain and discomfort.  Follow-up with a primary doctor if your symptoms have not resolved in the next week.

## 2021-11-09 NOTE — ED Triage Notes (Addendum)
Pt arrives pov, steady gait, c/o mechanical fall onto cement steps x 2 days pta. +LOC,  Pt c/o HA, dizziness, "eye strain, posterior neck pain with tenderness. AO x 4, GCS 15

## 2021-11-09 NOTE — ED Provider Notes (Signed)
[facility]    CSN: [id number] Arrival date & time: 11/09/21  1606      History   Chief Complaint Chief Complaint  Patient presents with   Concussion    Had a hard fall at concert Saturday down steps hit head hard. Slight headache, right eye pain bruised fromArms to feet back is the worse feel dizzy and light on feet. No energy. - Entered by patient    HPI [name] is a 41 y.o. female.   Patient presents for further evaluation after a fall with associated head injury that occurred about 3 days ago.  Patient reports that she was going down cement stairs at a concert when she fell backwards landing on her head.  She reports that she lost consciousness and was out for approximately 2 minutes.  It was a witnessed fall.  She states that she had 2 episodes of nonbloody emesis yesterday with associated nausea.  She has also been having a generalized headache ever since head injury.  Denies dizziness but does report that she has been having to "strain with her vision". Does not take any blood thinning medications.      Past Medical History:  Diagnosis Date   ADD (attention deficit disorder)    Complication of anesthesia    Endometriosis    Family history of malignant neoplasm of gastrointestinal tract    Fatigue    Fibromyalgia    GERD (gastroesophageal reflux disease)    Kidney stone    Migraine    Personal history of colonic polyps 06/01/2006   sessil serrated adenoma   PONV (postoperative nausea and vomiting)    Vitamin B12 deficiency     Patient Active Problem List   Diagnosis Date Noted   Conjunctivitis 09/24/2021   Restless legs 03/18/2021   Myalgia 03/18/2021   Stress reaction 08/18/2020   Bruising 01/30/2020   Liver lesion 01/15/2020   Screening mammogram, encounter for 10/18/2018   Vitamin D deficiency 08/22/2018   Encounter for routine gynecological examination 02/02/2017   Neck pain 11/09/2016   Eczema 01/29/2015   Health maintenance  examination 05/16/2014   Anxiety disorder 03/19/2013   Left ovarian cyst 03/07/2013   ADD (attention deficit disorder) 02/29/2012   Fibromyalgia 03/26/2011   Myofascial pain 03/19/2011   Gluten intolerance 01/22/2011   Obesity (BMI 30-39.9) 11/09/2010   Routine general medical examination at a health care facility 10/28/2010   Fatigue 05/18/2010   FIBROCYSTIC BREAST DISEASE 03/27/2010   CAVERNOUS HEMANGIOMA, LIVER 12/03/2009   PERSONAL HISTORY OF COLONIC POLYPS 12/03/2009   Allergic rhinitis 03/09/2008   URINARY FREQUENCY, CHRONIC 01/31/2007   Vitamin B12 deficiency 10/28/2006   GERD 04/27/2006   RENAL CALCULUS, HX OF 04/27/2006   Migraine syndrome 04/27/2006    Past Surgical History:  Procedure Laterality Date   ABDOMINAL HYSTERECTOMY     BREAST SURGERY  2004   breast reduction   CHOLECYSTECTOMY     LAPAROSCOPY  11/2004   endometriosis   OVARIAN CYST SURGERY     REDUCTION MAMMAPLASTY     ROBOTIC ASSISTED TOTAL HYSTERECTOMY Bilateral 04/10/2019   Procedure: XI ROBOTIC ASSISTED TOTAL HYSTERECTOMY AND SALPINGECTOMY AND RIGHT OVARIAN CYSTECTOMY;  Surgeon: Lavonia Drafts, MD;  Location: [facility];  Service: Gynecology;  Laterality: Bilateral;   TUBAL LIGATION  2008   WISDOM TOOTH EXTRACTION     with four other teeth as well    OB History     Gravida  2   Para  2  Term  1   Preterm  1   AB      Living  2      SAB      IAB      Ectopic      Multiple      Live Births  2            Home Medications    Prior to Admission medications   Medication Sig Start Date End Date Taking? Authorizing Provider  fluticasone (FLONASE) 50 MCG/ACT nasal spray Place 2 sprays into both nostrils daily. 12/13/20   Hassell Done, Mary-Margaret, FNP  Olopatadine HCl 0.2 % SOLN Apply 1 drop to eye daily. 09/24/21   Tower, Wynelle Fanny, MD  Rimegepant Sulfate (NURTEC) 75 MG TBDP DISSOLVE 1 TABLET ON TONGUE DAILY AS NEEDED. 05/20/21   Tower, Wynelle Fanny, MD   Semaglutide-Weight Management 0.5 MG/0.5ML SOAJ Inject 0.5 mg into the skin once a week. 10/27/21   Tower, Wynelle Fanny, MD    Family History Family History  Problem Relation Age of Onset   Cervical cancer Mother 32   Hypertension Mother    Diabetes Mother    Kidney cancer Mother 47   Cancer Mother        Melanoma   Kidney failure Brother 65       s/p transplant ?due to PCKD or renal stones   Wilson's disease Father        liver transplant   Ulcerative colitis Father    Liver disease Father    Colon polyps Sister    Breast cancer Maternal Aunt    Breast cancer Maternal Grandmother    Colon cancer Paternal Grandfather    Esophageal cancer Neg Hx    Stomach cancer Neg Hx    Rectal cancer Neg Hx     Social History Social History   Tobacco Use   Smoking status: Former    Packs/day: 0.25    Types: Cigarettes    Quit date: 01/25/2005    Years since quitting: 16.8   Smokeless tobacco: Never  Vaping Use   Vaping Use: Never used  Substance Use Topics   Alcohol use: Yes    Comment: occasionally   Drug use: No     Allergies   Strawberry extract, Clindamycin, Gabapentin, Morphine and related, Penicillins, Doxycycline, and Sulfa antibiotics   Review of Systems Review of Systems Per HPI  Physical Exam Triage Vital Signs ED Triage Vitals [11/09/21 1628]  Enc Vitals Group     BP 134/85     Pulse Rate 86     Resp 18     Temp      Temp src      SpO2 98 %     Weight      Height      Head Circumference      Peak Flow      Pain Score      Pain Loc      Pain Edu?      Excl. in [place]?    No data found.  Updated Vital Signs BP 134/85   Pulse 86   Resp 18   LMP 04/03/2019 (Exact Date)   SpO2 98%   Visual Acuity Right Eye Distance:   Left Eye Distance:   Bilateral Distance:    Right Eye Near:   Left Eye Near:    Bilateral Near:     Physical Exam Constitutional:      General: She is not in acute distress.  Appearance: Normal appearance. She is not  toxic-appearing or diaphoretic.  HENT:     Head: Normocephalic and atraumatic.     Comments: No obvious swelling, lacerations, abrasions noted to head.  Eyes:     Extraocular Movements: Extraocular movements intact.     Conjunctiva/sclera: Conjunctivae normal.  Pulmonary:     Effort: Pulmonary effort is normal.  Neurological:     General: No focal deficit present.     Mental Status: She is alert and oriented to person, place, and time. Mental status is at baseline.     Cranial Nerves: Cranial nerves 2-12 are intact.     Sensory: Sensation is intact.     Motor: Motor function is intact.     Coordination: Coordination is intact.     Gait: Gait is intact.  Psychiatric:        Mood and Affect: Mood normal.        Behavior: Behavior normal.        Thought Content: Thought content normal.        Judgment: Judgment normal.      UC Treatments / Results  Labs (all labs ordered are listed, but only abnormal results are displayed) Labs Reviewed - No data to display  EKG   Radiology No results found.  Procedures Procedures (including critical care time)  Medications Ordered in UC Medications - No data to display  Initial Impression / Assessment and Plan / UC Course  I have reviewed the triage vital signs and the nursing notes.  Pertinent labs & imaging results that were available during my care of the patient were reviewed by me and considered in my medical decision making (see chart for details).     Given mechanism of head injury, associated vomiting, and associated loss of consciousness I do think that patient needs to go to the emergency department for further evaluation and management as imaging of the head may be warranted which cannot be provided here in urgent care.  Patient was agreeable with plan.  Patient had family here to transport her and left via the family member transporting her. Final Clinical Impressions(s) / UC Diagnoses   Final diagnoses:  Injury of  head, initial encounter  Fall, initial encounter     Discharge Instructions      Please go to the emergency department as soon as you leave urgent care for further evaluation and management.    ED Prescriptions   None    PDMP not reviewed this encounter.   Teodora Medici, [place] 11/09/21 [phone]

## 2021-11-09 NOTE — ED Triage Notes (Signed)
Pt reports she tripped and fell down an entire set of cement stairs at a concert 2 days ago with reported witnessed +LOC and hitting posterior head on stairs. Had 2 episodes vomiting yesterday, and initially did not have HA. Today c/o generalized HA, slight dizziness, neck pain, "straining with vision" and photosensitivity.

## 2021-11-16 ENCOUNTER — Encounter: Payer: Self-pay | Admitting: Family Medicine

## 2021-11-18 NOTE — Telephone Encounter (Signed)
Received fax refill request, will placed in your inbox so you can view what strength they are requesting

## 2021-11-19 MED ORDER — SEMAGLUTIDE-WEIGHT MANAGEMENT 0.5 MG/0.5ML ~~LOC~~ SOAJ: 0.5000 mg | SUBCUTANEOUS | 3 refills | Status: DC

## 2021-12-14 ENCOUNTER — Other Ambulatory Visit: Payer: Self-pay | Admitting: Family Medicine

## 2021-12-15 NOTE — Telephone Encounter (Signed)
Last filled on 05/20/21 #8 tabs w/ 11 refills, last f/u was on 10/27/21  Last Rx was sent to [facility], this is a new request sent to [facility]

## 2022-01-09 ENCOUNTER — Encounter: Payer: Self-pay | Admitting: Family Medicine

## 2022-01-11 MED ORDER — SEMAGLUTIDE-WEIGHT MANAGEMENT 1 MG/0.5ML ~~LOC~~ SOAJ: 1.0000 mg | SUBCUTANEOUS | 3 refills | Status: DC

## 2022-01-15 ENCOUNTER — Ambulatory Visit: Payer: Commercial Managed Care - PPO | Admitting: Family Medicine

## 2022-03-20 ENCOUNTER — Other Ambulatory Visit: Payer: Self-pay | Admitting: Family Medicine

## 2022-03-22 ENCOUNTER — Other Ambulatory Visit: Payer: Self-pay | Admitting: Family Medicine

## 2022-03-23 NOTE — Telephone Encounter (Signed)
Last filled 01/11/22 # 2 mL with 3 refills, CPE 05/21/22

## 2022-04-23 ENCOUNTER — Telehealth: Payer: Self-pay

## 2022-04-23 NOTE — Telephone Encounter (Signed)
PA request received via CMM for Nurtec 75MG  dispersible tablets  PA submitted to [employer] and is pending determination.  No notation in chart of previous trials of triptans.  Patient has been on Topanax, Gabapentin, Motrin  Key: [id number]

## 2022-04-29 ENCOUNTER — Other Ambulatory Visit (HOSPITAL_COMMUNITY): Payer: Self-pay

## 2022-04-29 NOTE — Telephone Encounter (Signed)
If she has tried to triptan medicines (like imitrex, maxalt, zomig) let me know  [place]

## 2022-04-29 NOTE — Telephone Encounter (Signed)
Pharmacy Patient Advocate Encounter  Received notification from [facility] that the request for prior authorization for Nurtec 75MG  dispersible tablets has been denied due to A) You have not tried two triptan 5-HT1 receptor agonists, and B) You do not have a medical reason not to take them.    Please be advised we currently do not have a Pharmacist to review denials, therefore you will need to process appeals accordingly as needed. Thanks for your support at this time.   You may fax [phone] to appeal.

## 2022-05-03 ENCOUNTER — Ambulatory Visit: Payer: Commercial Managed Care - PPO

## 2022-05-03 NOTE — Telephone Encounter (Signed)
Patient called back states she has taken both imitrex and maxalt in the past. Did not have any issues/symptoms that she can remember but was not effective for treatment.

## 2022-05-03 NOTE — Telephone Encounter (Signed)
Can the prior auth be re done noting she found imitrex and maxalt ineffective ?  Thanks

## 2022-05-03 NOTE — Telephone Encounter (Signed)
Left message to return call to our office.  

## 2022-05-04 ENCOUNTER — Ambulatory Visit: Payer: Commercial Managed Care - PPO

## 2022-05-04 ENCOUNTER — Telehealth: Payer: Commercial Managed Care - PPO | Admitting: Family Medicine

## 2022-05-04 DIAGNOSIS — J029 Acute pharyngitis, unspecified: Secondary | ICD-10-CM

## 2022-05-04 MED ORDER — AZITHROMYCIN 250 MG PO TABS: ORAL_TABLET | ORAL | 0 refills | Status: AC

## 2022-05-04 NOTE — Progress Notes (Signed)
Virtual Visit Consent   [name], you are scheduled for a virtual visit with a [place] [facility] provider today. Just as with appointments in the office, your consent must be obtained to participate. Your consent will be active for this visit and any virtual visit you may have with one of our providers in the next 365 days. If you have a MyChart account, a copy of this consent can be sent to you electronically.  As this is a virtual visit, video technology does not allow for your provider to perform a traditional examination. This may limit your provider's ability to fully assess your condition. If your provider identifies any concerns that need to be evaluated in person or the need to arrange testing (such as labs, EKG, etc.), we will make arrangements to do so. Although advances in technology are sophisticated, we cannot ensure that it will always work on either your end or our end. If the connection with a video visit is poor, the visit may have to be switched to a telephone visit. With either a video or telephone visit, we are not always able to ensure that we have a secure connection.  By engaging in this virtual visit, you consent to the provision of healthcare and authorize for your insurance to be billed (if applicable) for the services provided during this visit. Depending on your insurance coverage, you may receive a charge related to this service.  I need to obtain your verbal consent now. Are you willing to proceed with your visit today? [name] has provided verbal consent on 05/04/2022 for a virtual visit (video or telephone). Freddy Finner, NP  Date: 05/04/2022 11:16 AM  Virtual Visit via Video Note   I, Freddy Finner, connected with  [name]  ([id number], 1980-09-02) on 05/04/22 at 11:15 AM EDT by a video-enabled telemedicine application and verified that I am speaking with the correct person using two identifiers.  Location: Patient: Virtual Visit Location Patient:  Home Provider: Virtual Visit Location Provider: Home Office   I discussed the limitations of evaluation and management by telemedicine and the availability of in person appointments. The patient expressed understanding and agreed to proceed.    History of Present Illness: [name] is a 42 y.o. who identifies as a female who was assigned female at birth, and is being seen today for sore throat.  Onset was Sunday 2 days ago with sore throat started and has progressed Associated symptoms are cough yesterday afternoon some green mucus, swollen glands, tired, fever 100.3 t max, body aches Modifying factors are ibuprofen and allergy- zytrec and flonase Denies chest pain, shortness of breath  Exposure to sick contacts- unknown COVID test: none Vaccines: no vaccines this 2023 season    Problems:  Patient Active Problem List   Diagnosis Date Noted   Conjunctivitis 09/24/2021   Restless legs 03/18/2021   Myalgia 03/18/2021   Stress reaction 08/18/2020   Bruising 01/30/2020   Liver lesion 01/15/2020   Screening mammogram, encounter for 10/18/2018   Vitamin D deficiency 08/22/2018   Encounter for routine gynecological examination 02/02/2017   Neck pain 11/09/2016   Eczema 01/29/2015   Health maintenance examination 05/16/2014   Anxiety disorder 03/19/2013   Left ovarian cyst 03/07/2013   ADD (attention deficit disorder) 02/29/2012   Fibromyalgia 03/26/2011   Myofascial pain 03/19/2011   Gluten intolerance 01/22/2011   Obesity (BMI 30-39.9) 11/09/2010   Routine general medical examination at a health care facility 10/28/2010  Fatigue 05/18/2010   FIBROCYSTIC BREAST DISEASE 03/27/2010   CAVERNOUS HEMANGIOMA, LIVER 12/03/2009   PERSONAL HISTORY OF COLONIC POLYPS 12/03/2009   Allergic rhinitis 03/09/2008   URINARY FREQUENCY, CHRONIC 01/31/2007   Vitamin B12 deficiency 10/28/2006   GERD 04/27/2006   RENAL CALCULUS, HX OF 04/27/2006   Migraine syndrome 04/27/2006     Allergies:  Allergies  Allergen Reactions   Strawberry Extract Anaphylaxis and Hives   Clindamycin Hives    angioedema   Gabapentin     Dizzy/ worse headache   Morphine And Related     Agitated and Angry   Penicillins Hives    Angioedema Did it involve swelling of the face/tongue/throat, SOB, or low BP? No Did it involve sudden or severe rash/hives, skin peeling, or any reaction on the inside of your mouth or nose? No Did you need to seek medical attention at a hospital or doctor's office? No When did it last happen?   51 or 42 years old If all above answers are "NO", may proceed with cephalosporin use.    Doxycycline Rash   Sulfa Antibiotics Rash   Medications:  Current Outpatient Medications:    fluticasone (FLONASE) 50 MCG/ACT nasal spray, Place 2 sprays into both nostrils daily., Disp: 16 g, Rfl: 6   Olopatadine HCl 0.2 % SOLN, Apply 1 drop to eye daily., Disp: 2.5 mL, Rfl: 1   Rimegepant Sulfate (NURTEC) 75 MG TBDP, Dissolve 1 tablet on tongue daily as needed., Disp: 8 tablet, Rfl: 11   Semaglutide-Weight Management (WEGOVY) 1 MG/0.5ML SOAJ, Inject 1 mg under the skin once weekly., Disp: 2 mL, Rfl: 2  Observations/Objective: Patient is well-developed, well-nourished in no acute distress.  Resting comfortably  at home.  Head is normocephalic, atraumatic.  No labored breathing.  Speech is clear and coherent with logical content.  Patient is alert and oriented at baseline.    Assessment and Plan:   1. Pharyngitis, unspecified etiology  - azithromycin (ZITHROMAX) 250 MG tablet; Take 2 tablets on day 1, then 1 tablet daily on days 2 through 5  Dispense: 6 tablet; Refill: 0   -in the setting of frequent strep in last several years, worsening sore throat over all other S&S -will treat with zpack to be on safe side, given the fever starting -info on AVS -follow up in person if not improving -flonase and zytrec for allergy overlapping symptoms -work note  provided  Reviewed side effects, risks and benefits of medication.    Patient acknowledged agreement and understanding of the plan.   Past Medical, Surgical, Social History, Allergies, and Medications have been Reviewed.   Follow Up Instructions: I discussed the assessment and treatment plan with the patient. The patient was provided an opportunity to ask questions and all were answered. The patient agreed with the plan and demonstrated an understanding of the instructions.  A copy of instructions were sent to the patient via MyChart unless otherwise noted below.    The patient was advised to call back or seek an in-person evaluation if the symptoms worsen or if the condition fails to improve as anticipated.  Time:  I spent 10 minutes with the patient via telehealth technology discussing the above problems/concerns.    Freddy Finner, NP

## 2022-05-04 NOTE — Patient Instructions (Addendum)
Particia Jasper, thank you for joining Freddy Finner, NP for today's virtual visit.  While this provider is not your primary care provider (PCP), if your PCP is located in our provider database this encounter information will be shared with them immediately following your visit.   A [facility] MyChart account gives you access to today's visit and all your visits, tests, and labs performed at [facility] " click here if you don't have a [facility] MyChart account or go to [email or web address]  Consent: (Patient) [name] provided verbal consent for this virtual visit at the beginning of the encounter.  Current Medications:  Current Outpatient Medications:    azithromycin (ZITHROMAX) 250 MG tablet, Take 2 tablets on day 1, then 1 tablet daily on days 2 through 5, Disp: 6 tablet, Rfl: 0   fluticasone (FLONASE) 50 MCG/ACT nasal spray, Place 2 sprays into both nostrils daily., Disp: 16 g, Rfl: 6   Olopatadine HCl 0.2 % SOLN, Apply 1 drop to eye daily., Disp: 2.5 mL, Rfl: 1   Rimegepant Sulfate (NURTEC) 75 MG TBDP, Dissolve 1 tablet on tongue daily as needed., Disp: 8 tablet, Rfl: 11   Semaglutide-Weight Management (WEGOVY) 1 MG/0.5ML SOAJ, Inject 1 mg under the skin once weekly., Disp: 2 mL, Rfl: 2   Medications ordered in this encounter:  Meds ordered this encounter  Medications   azithromycin (ZITHROMAX) 250 MG tablet    Sig: Take 2 tablets on day 1, then 1 tablet daily on days 2 through 5    Dispense:  6 tablet    Refill:  0    Order Specific Question:   Supervising Provider    Answer:   Merrilee Jansky [[id number]]     *If you need refills on other medications prior to your next appointment, please contact your pharmacy*  Follow-Up: Call back or seek an in-person evaluation if the symptoms worsen or if the condition fails to improve as anticipated.  [facility] [phone]  Other Instructions  Pharyngitis  Pharyngitis is a sore throat  (pharynx). This is when there is redness, pain, and swelling in your throat. Most of the time, this condition gets better on its own. In some cases, you may need medicine. What are the causes? An infection from a virus. An infection from bacteria. Allergies. What increases the risk? Being 53-16 years old. Being in crowded environments. These include: Daycares. Schools. Dormitories. Living in a place with cold temperatures outside. Having a weakened disease-fighting (immune) system. What are the signs or symptoms? Symptoms may vary depending on the cause. Common symptoms include: Sore throat. Tiredness (fatigue). Low-grade fever. Stuffy nose. Cough. Headache. Other symptoms may include: Glands in the neck (lymph nodes) that are swollen. Skin rashes. Film on the throat or tonsils. This can be caused by an infection from bacteria. Vomiting. Red, itchy eyes. Loss of appetite. Joint pain and muscle aches. Tonsils that are temporarily bigger than usual (enlarged). How is this treated? Many times, treatment is not needed. This condition usually gets better in 3-4 days without treatment. If the infection is caused by a bacteria, you may be need to take antibiotics. Follow these instructions at home: Medicines Take over-the-counter and prescription medicines only as told by your doctor. If you were prescribed an antibiotic medicine, take it as told by your doctor. Do not stop taking the antibiotic even if you start to feel better. Use throat lozenges or sprays to soothe your throat as told by your doctor.  Children can get pharyngitis. Do not give your child aspirin. Managing pain To help with pain, try: Sipping warm liquids, such as: Broth. Herbal tea. Warm water. Eating or drinking cold or frozen liquids, such as frozen ice pops. Rinsing your mouth (gargle) with a salt water mixture 3-4 times a day or as needed. To make salt water, dissolve -1 tsp (3-6 g) of salt in 1 cup (237  mL) of warm water. Do not swallow this mixture. Sucking on hard candy or throat lozenges. Putting a cool-mist humidifier in your bedroom at night to moisten the air. Sitting in the bathroom with the door closed for 5-10 minutes while you run hot water in the shower.  General instructions  Do not smoke or use any products that contain nicotine or tobacco. If you need help quitting, ask your doctor. Rest as told by your doctor. Drink enough fluid to keep your pee (urine) pale yellow. How is this prevented? Wash your hands often for at least 20 seconds with soap and water. If soap and water are not available, use hand sanitizer. Do not touch your eyes, nose, or mouth with unwashed hands. Wash hands after touching these areas. Do not share cups or eating utensils. Avoid close contact with people who are sick. Contact a doctor if: You have large, tender lumps in your neck. You have a rash. You cough up green, yellow-brown, or bloody spit. Get help right away if: You have a stiff neck. You drool or cannot swallow liquids. You cannot drink or take medicines without vomiting. You have very bad pain that does not go away with medicine. You have problems breathing, and it is not from a stuffy nose. You have new pain and swelling in your knees, ankles, wrists, or elbows. These symptoms may be an emergency. Get help right away. Call your local emergency services (911 in the [place] Do not wait to see if the symptoms will go away. Do not drive yourself to the hospital. Summary Pharyngitis is a sore throat (pharynx). This is when there is redness, pain, and swelling in your throat. Most of the time, pharyngitis gets better on its own. Sometimes, you may need medicine. If you were prescribed an antibiotic medicine, take it as told by your doctor. Do not stop taking the antibiotic even if you start to feel better. This information is not intended to replace advice given to you by your health care  provider. Make sure you discuss any questions you have with your health care provider. Document Revised: 04/09/2020 Document Reviewed: 04/09/2020 Elsevier Patient Education  2023 Elsevier Inc.    If you have been instructed to have an in-person evaluation today at a local Urgent Care facility, please use the link below. It will take you to a list of all of our available [facility], including address, phone number and hours of operation. Please do not delay care.  [facility]  If you or a family member do not have a primary care provider, use the link below to schedule a visit and establish care. When you choose a [facility] primary care physician or advanced practice provider, you gain a long-term partner in health. Find a Primary Care Provider  Learn more about [facility] in-office and virtual care options: [facility] - Get Care Now

## 2022-05-05 ENCOUNTER — Encounter: Payer: Self-pay | Admitting: Family Medicine

## 2022-05-06 ENCOUNTER — Ambulatory Visit (INDEPENDENT_AMBULATORY_CARE_PROVIDER_SITE_OTHER): Payer: Commercial Managed Care - PPO | Admitting: Nurse Practitioner

## 2022-05-06 ENCOUNTER — Encounter: Payer: Self-pay | Admitting: Nurse Practitioner

## 2022-05-06 VITALS — BP 136/84 | HR 81 | Temp 97.4°F | Ht 62.5 in | Wt 152.0 lb

## 2022-05-06 DIAGNOSIS — R0602 Shortness of breath: Secondary | ICD-10-CM | POA: Insufficient documentation

## 2022-05-06 DIAGNOSIS — J029 Acute pharyngitis, unspecified: Secondary | ICD-10-CM | POA: Diagnosis not present

## 2022-05-06 DIAGNOSIS — R059 Cough, unspecified: Secondary | ICD-10-CM | POA: Diagnosis not present

## 2022-05-06 DIAGNOSIS — R509 Fever, unspecified: Secondary | ICD-10-CM | POA: Insufficient documentation

## 2022-05-06 DIAGNOSIS — R52 Pain, unspecified: Secondary | ICD-10-CM | POA: Insufficient documentation

## 2022-05-06 LAB — POC COVID19 BINAXNOW: SARS Coronavirus 2 Ag: NEGATIVE

## 2022-05-06 LAB — POCT INFLUENZA A/B
Influenza A, POC: NEGATIVE
Influenza B, POC: NEGATIVE

## 2022-05-06 LAB — MONONUCLEOSIS SCREEN: Mono Screen: NEGATIVE

## 2022-05-06 LAB — POCT RAPID STREP A (OFFICE): Rapid Strep A Screen: NEGATIVE

## 2022-05-06 MED ORDER — GUAIFENESIN-CODEINE 100-10 MG/5ML PO SYRP: 5.0000 mL | ORAL_SOLUTION | Freq: Three times a day (TID) | ORAL | 0 refills | Status: AC | PRN

## 2022-05-06 MED ORDER — ALBUTEROL SULFATE HFA 108 (90 BASE) MCG/ACT IN AERS: 2.0000 | INHALATION_SPRAY | Freq: Four times a day (QID) | RESPIRATORY_TRACT | 0 refills | Status: DC | PRN

## 2022-05-06 NOTE — Patient Instructions (Signed)
Nice to see you today Strep, covid, and flu test was negative I do think this is viral in nature and should continue to improve Generally take 7 days to start improving  Continue using tylenol/ibuprofen as needed for body aches and sore throat You can use warm salt water gargles to aid in the sore throat. Also use throat lozenges or throat spray to help.

## 2022-05-06 NOTE — Assessment & Plan Note (Signed)
Lungs clear to auscultation.  Will send in albuterol inhaler for patient to use on a as needed basis

## 2022-05-06 NOTE — Progress Notes (Signed)
Acute Office Visit  Subjective:     Patient ID: [name], female    DOB: 01-19-1981, 42 y.o.   MRN: [id number]  Chief Complaint  Patient presents with   Sore Throat    C/o ST, cough and fatigue, fever and body aches. Sxs started 05/02/22. Pt recently seen/tx for sxs with abx, no improvement.      Patient is in today for sick symptoms with a history of migriane, eczema, RLS, allergic rhinnitis   Symptoms started on 05/02/2022 She was evaluated on 05/04/2022 by virtual visit and was written a z pak to cover strep as she has a history  Covid vaccine not up to date Flu vaccine: not up to date Sick contacts: states that she works at [employer] and some coworkers  Treamtnet: z-pak, flonase and antihistamine. States that she has been taken tylenol/advil in one. States that it helps for short time   Hot tea also    Review of Systems  Constitutional:  Positive for chills, fever and malaise/fatigue.       Appetite has been decreased  Fluid intake is good   HENT:  Positive for ear pain (full) and sore throat. Negative for sinus pain.   Respiratory:  Positive for cough, sputum production (dark grren) and shortness of breath.   Musculoskeletal:  Positive for joint pain and myalgias.  Neurological:  Negative for headaches.        Objective:    BP 136/84   Pulse 81   Temp (!) 97.4 F (36.3 C) (Temporal)   Ht 5' 2.5" (1.588 m)   Wt 152 lb (68.9 kg)   LMP 04/03/2019 (Exact Date)   SpO2 99%   BMI 27.36 kg/m    Physical Exam Vitals and nursing note reviewed.  Constitutional:      Appearance: Normal appearance.  HENT:     Right Ear: Tympanic membrane and ear canal normal.     Left Ear: Tympanic membrane and ear canal normal.     Nose:     Right Sinus: Maxillary sinus tenderness present. No frontal sinus tenderness.     Left Sinus: Maxillary sinus tenderness present. No frontal sinus tenderness.     Mouth/Throat:     Mouth: Mucous membranes are moist.      Pharynx: Posterior oropharyngeal erythema present.  Cardiovascular:     Rate and Rhythm: Normal rate and regular rhythm.     Heart sounds: Normal heart sounds.  Pulmonary:     Effort: Pulmonary effort is normal.     Breath sounds: Normal breath sounds.  Lymphadenopathy:     Cervical: Cervical adenopathy present.  Neurological:     Mental Status: She is alert.     Results for orders placed or performed in visit on 05/06/22  POCT Influenza A/B  Result Value Ref Range   Influenza A, POC Negative Negative   Influenza B, POC Negative Negative  POCT rapid strep A  Result Value Ref Range   Rapid Strep A Screen Negative Negative  POC COVID-19 BinaxNow  Result Value Ref Range   SARS Coronavirus 2 Ag Negative Negative        Assessment & Plan:   Problem List Items Addressed This Visit       Other   Sore throat    Strep test in office negative.  Patient has history of recurrent strep but she is being currently treated with azithromycin antibiotic without good relief.  Does have some pharyngeal erythema along with fevers will  rule out mononucleosis pending labs patient can do over-the-counter treatment such as oral analgesics, warm salt water gargles and throat lozenges and sprays.      Relevant Orders   POCT rapid strep A (Completed)   Mononucleosis screen   Epstein-Barr virus early D antigen antibody, IgG   CMV abs, IgG+IgM (cytomegalovirus)   Cough    Patient has Tessalon Perles at home.  She would like something to take at night to help her rest will send in codeine guaifenesin cough medication.  Patient has a morphine allergy but states she is tolerating the codeine cough medication in the past.  Sedation precautions reviewed      Relevant Medications   guaiFENesin-codeine (ROBITUSSIN AC) 100-10 MG/5ML syrup   Other Relevant Orders   POC COVID-19 BinaxNow (Completed)   Body aches - Primary    Flu and COVID test in office.  Over-the-counter analgesics as needed for body  aches rest drink plenty of fluid.      Relevant Orders   POCT Influenza A/B (Completed)   Shortness of breath    Lungs clear to auscultation.  Will send in albuterol inhaler for patient to use on a as needed basis      Relevant Medications   albuterol (VENTOLIN HFA) 108 (90 Base) MCG/ACT inhaler   Fever and chills    Flu, COVID, strep test.      Relevant Orders   Mononucleosis screen   Epstein-Barr virus early D antigen antibody, IgG   CMV abs, IgG+IgM (cytomegalovirus)    Meds ordered this encounter  Medications   albuterol (VENTOLIN HFA) 108 (90 Base) MCG/ACT inhaler    Sig: Inhale 2 puffs into the lungs every 6 (six) hours as needed for wheezing or shortness of breath.    Dispense:  8 g    Refill:  0    Order Specific Question:   Supervising Provider    Answer:   Roxy Manns A [[id number]]   guaiFENesin-codeine (ROBITUSSIN AC) 100-10 MG/5ML syrup    Sig: Take 5 mLs by mouth 3 (three) times daily as needed for up to 5 days for cough.    Dispense:  75 mL    Refill:  0    Order Specific Question:   Supervising Provider    Answer:   Roxy Manns A [[id number]]    Return if symptoms worsen or fail to improve.  Audria Nine, NP

## 2022-05-06 NOTE — Assessment & Plan Note (Signed)
Patient has Lawyer at home.  She would like something to take at night to help her rest will send in codeine guaifenesin cough medication.  Patient has a morphine allergy but states she is tolerating the codeine cough medication in the past.  Sedation precautions reviewed

## 2022-05-06 NOTE — Assessment & Plan Note (Signed)
Strep test in office negative.  Patient has history of recurrent strep but she is being currently treated with azithromycin antibiotic without good relief.  Does have some pharyngeal erythema along with fevers will rule out mononucleosis pending labs patient can do over-the-counter treatment such as oral analgesics, warm salt water gargles and throat lozenges and sprays.

## 2022-05-06 NOTE — Assessment & Plan Note (Signed)
Flu and COVID test in office.  Over-the-counter analgesics as needed for body aches rest drink plenty of fluid.

## 2022-05-06 NOTE — Assessment & Plan Note (Signed)
Flu, COVID, strep test.

## 2022-05-10 ENCOUNTER — Encounter: Payer: Self-pay | Admitting: Nurse Practitioner

## 2022-05-11 LAB — EPSTEIN-BARR VIRUS EARLY D ANTIGEN ANTIBODY, IGG: EBV EA IgG: 13.4 U/mL — ABNORMAL HIGH (ref <9.00)

## 2022-05-11 LAB — CMV ABS, IGG+IGM (CYTOMEGALOVIRUS)
CMV IgM: <30 AU/mL
Cytomegalovirus Ab-IgG: <0.6 U/mL

## 2022-05-12 ENCOUNTER — Encounter: Payer: Self-pay | Admitting: Nurse Practitioner

## 2022-05-13 ENCOUNTER — Encounter: Payer: Self-pay | Admitting: Family Medicine

## 2022-05-13 MED ORDER — SEMAGLUTIDE-WEIGHT MANAGEMENT 1.7 MG/0.75ML ~~LOC~~ SOAJ: 1.7000 mg | SUBCUTANEOUS | 0 refills | Status: DC

## 2022-05-14 ENCOUNTER — Telehealth (INDEPENDENT_AMBULATORY_CARE_PROVIDER_SITE_OTHER): Payer: Commercial Managed Care - PPO | Admitting: Family Medicine

## 2022-05-14 ENCOUNTER — Other Ambulatory Visit: Payer: Commercial Managed Care - PPO

## 2022-05-14 DIAGNOSIS — Z0000 Encounter for general adult medical examination without abnormal findings: Secondary | ICD-10-CM

## 2022-05-14 DIAGNOSIS — E538 Deficiency of other specified B group vitamins: Secondary | ICD-10-CM

## 2022-05-14 DIAGNOSIS — E559 Vitamin D deficiency, unspecified: Secondary | ICD-10-CM

## 2022-05-14 NOTE — Telephone Encounter (Signed)
Lab orders

## 2022-05-14 NOTE — Addendum Note (Signed)
Addended by: Eual Fines on: 05/14/2022 04:11 PM   Modules accepted: Orders

## 2022-05-15 LAB — CBC WITH DIFFERENTIAL/PLATELET
Absolute Monocytes: 855 cells/uL (ref 200–950)
Basophils Absolute: 58 cells/uL (ref 0–200)
Basophils Relative: 0.7 %
Eosinophils Absolute: 133 cells/uL (ref 15–500)
Eosinophils Relative: 1.6 %
HCT: 36.2 % (ref 35.0–45.0)
Hemoglobin: 12.2 g/dL (ref 11.7–15.5)
Lymphs Abs: 2133 cells/uL (ref 850–3900)
MCH: 30 pg (ref 27.0–33.0)
MCHC: 33.7 g/dL (ref 32.0–36.0)
MCV: 89.2 fL (ref 80.0–100.0)
MPV: 10.7 fL (ref 7.5–12.5)
Monocytes Relative: 10.3 %
Neutro Abs: 5121 cells/uL (ref 1500–7800)
Neutrophils Relative %: 61.7 %
Platelets: 260 10*3/uL (ref 140–400)
RBC: 4.06 10*6/uL (ref 3.80–5.10)
RDW: 12.3 % (ref 11.0–15.0)
Total Lymphocyte: 25.7 %
WBC: 8.3 10*3/uL (ref 3.8–10.8)

## 2022-05-15 LAB — LIPID PANEL
Cholesterol: 190 mg/dL (ref <200)
HDL: 73 mg/dL (ref >=50)
LDL Cholesterol (Calc): 101 mg/dL (calc) — ABNORMAL HIGH
Non-HDL Cholesterol (Calc): 117 mg/dL (calc) (ref <130)
Total CHOL/HDL Ratio: 2.6 (calc) (ref <5.0)
Triglycerides: 69 mg/dL (ref <150)

## 2022-05-15 LAB — COMPREHENSIVE METABOLIC PANEL
AG Ratio: 1.7 (calc) (ref 1.0–2.5)
ALT: 8 U/L (ref 6–29)
AST: 13 U/L (ref 10–30)
Albumin: 4.3 g/dL (ref 3.6–5.1)
Alkaline phosphatase (APISO): 48 U/L (ref 31–125)
BUN: 10 mg/dL (ref 7–25)
CO2: 23 mmol/L (ref 20–32)
Calcium: 9.5 mg/dL (ref 8.6–10.2)
Chloride: 102 mmol/L (ref 98–110)
Creat: 0.7 mg/dL (ref 0.50–0.99)
Globulin: 2.6 g/dL (calc) (ref 1.9–3.7)
Glucose, Bld: 91 mg/dL (ref 65–99)
Potassium: 3.8 mmol/L (ref 3.5–5.3)
Sodium: 139 mmol/L (ref 135–146)
Total Bilirubin: 0.5 mg/dL (ref 0.2–1.2)
Total Protein: 6.9 g/dL (ref 6.1–8.1)

## 2022-05-15 LAB — TSH: TSH: 2.09 mIU/L

## 2022-05-15 LAB — VITAMIN D 25 HYDROXY (VIT D DEFICIENCY, FRACTURES): Vit D, 25-Hydroxy: 29 ng/mL — ABNORMAL LOW (ref 30–100)

## 2022-05-15 LAB — VITAMIN B12: Vitamin B-12: 562 pg/mL (ref 200–1100)

## 2022-05-21 ENCOUNTER — Ambulatory Visit (INDEPENDENT_AMBULATORY_CARE_PROVIDER_SITE_OTHER): Payer: Commercial Managed Care - PPO | Admitting: Family Medicine

## 2022-05-21 ENCOUNTER — Encounter: Payer: Self-pay | Admitting: Family Medicine

## 2022-05-21 VITALS — BP 106/62 | HR 75 | Temp 97.8°F | Ht 62.75 in | Wt 153.2 lb

## 2022-05-21 DIAGNOSIS — R5382 Chronic fatigue, unspecified: Secondary | ICD-10-CM

## 2022-05-21 DIAGNOSIS — E538 Deficiency of other specified B group vitamins: Secondary | ICD-10-CM

## 2022-05-21 DIAGNOSIS — Z86010 Personal history of colonic polyps: Secondary | ICD-10-CM | POA: Diagnosis not present

## 2022-05-21 DIAGNOSIS — E663 Overweight: Secondary | ICD-10-CM

## 2022-05-21 DIAGNOSIS — G43909 Migraine, unspecified, not intractable, without status migrainosus: Secondary | ICD-10-CM

## 2022-05-21 DIAGNOSIS — Z0000 Encounter for general adult medical examination without abnormal findings: Secondary | ICD-10-CM

## 2022-05-21 DIAGNOSIS — E559 Vitamin D deficiency, unspecified: Secondary | ICD-10-CM

## 2022-05-21 NOTE — Patient Instructions (Addendum)
Get back to to exercise gradually as you get better   I want you to take 4000 iu of vitamin D3 every day    Please call [facility] to ask when your next colonoscopy is due   [facility]  [phone]  If you are due and you need a referral - let us know    For cholesterol Avoid red meat/ fried foods/ egg yolks/ fatty breakfast meats/ butter, cheese and high fat dairy/ and shellfish

## 2022-05-21 NOTE — Progress Notes (Signed)
Subjective:    Patient ID: [name], female    DOB: 1980-06-16, 42 y.o.   MRN: [id number]  HPI Here for health maintenance exam and to review chronic medical problems     Wt Readings from Last 3 Encounters:  05/21/22 153 lb 4 oz (69.5 kg)  05/06/22 152 lb (68.9 kg)  11/09/21 160 lb (72.6 kg)   27.36 kg/m  Vitals:   05/21/22 1506  BP: 106/62  Pulse: 75  Temp: 97.8 F (36.6 C)  SpO2: 99%   Feels good  Major remodeling =doing themselves Working   Recently got mono  Throat is still sore  Some fatigue   Continues to loose weight  We went up to 17- waiting on auth for that    Migraines- nurtec is the only thing that works  Ins stopped covering it  We did auth- did fail 2 triptans so hopes it will be covered   Started eating a little meat for the first time in years and years Feels like she needed  Lots of produce as well   Exercise (before she got mono) 10,000 steps per day  3 d per week- weights and /or pilates     Immunization History  Administered Date(s) Administered   Influenza Split 11/09/2010   Influenza-Unspecified 10/26/1999, 10/25/2012   MMR 10/26/2010   Td 01/25/1997, 02/06/2008, 02/18/2020   Tdap 04/26/2010   Health Maintenance Due  Topic Date Due   Hepatitis C Screening  Never done   PAP SMEAR-Modifier  02/02/2022   Pap 01/2017 nl with neg HPV Has had a hysterectomy/partial  Fam h/o cervical cancer and kidney cancer in her mother  Kidney cancer in brother   Micah Flesher to gyn in oct  Check was fine    Personal h/o colon polyps  ? Next due  2013- unsure of recall   Mammogram 09/2021 Self breast exam : no lumps  Breast cancer in GM and aunt   Bone health - needs to start vit D   Mood    05/21/2022    3:16 PM 05/06/2022    9:02 AM 07/17/2021    2:32 PM 05/18/2021    4:02 PM 02/18/2020    4:59 PM  Depression screen PHQ 2/9  Decreased Interest 0 0 0 0 0  Down, Depressed, Hopeless 0 0 0 0 0  PHQ - 2 Score 0 0 0 0 0  Altered  sleeping 0 1  0 2  Tired, decreased energy 1 1  1 2   Change in appetite 1 0  1 0  Feeling bad or failure about yourself  0 0  0 0  Trouble concentrating 0 0  0 0  Moving slowly or fidgety/restless 0 0  0 0  Suicidal thoughts 0 0  0 0  PHQ-9 Score 2 2  2 4   Difficult doing work/chores Not difficult at all Not difficult at all  Somewhat difficult      B12 def Lab Results  Component Value Date   VITAMINB12 562 05/14/2022   D def Last vitamin D Lab Results  Component Value Date   VD25OH 29 (L) 05/14/2022   Cholesterol Lab Results  Component Value Date   CHOL 190 05/14/2022   CHOL 204 (H) 05/18/2021   CHOL 174 02/04/2020   Lab Results  Component Value Date   HDL 73 05/14/2022   HDL 96.10 05/18/2021   HDL 80.50 02/04/2020   Lab Results  Component Value Date   LDLCALC 101 (H) 05/14/2022  LDLCALC 95 05/18/2021   LDLCALC 81 02/04/2020   Lab Results  Component Value Date   TRIG 69 05/14/2022   TRIG 67.0 05/18/2021   TRIG 63.0 02/04/2020   Lab Results  Component Value Date   CHOLHDL 2.6 05/14/2022   CHOLHDL 2 05/18/2021   CHOLHDL 2 02/04/2020   No results found for: "LDLDIRECT" Overall LDL up slt  HDL down- out of exercise due to mono   Last metabolic panel Lab Results  Component Value Date   GLUCOSE 91 05/14/2022   NA 139 05/14/2022   K 3.8 05/14/2022   CL 102 05/14/2022   CO2 23 05/14/2022   BUN 10 05/14/2022   CREATININE 0.70 05/14/2022   GFRNONAA 113 01/14/2020   CALCIUM 9.5 05/14/2022   PHOS 3.2 04/20/2007   PROT 6.9 05/14/2022   ALBUMIN 4.6 05/18/2021   BILITOT 0.5 05/14/2022   ALKPHOS 43 05/18/2021   AST 13 05/14/2022   ALT 8 05/14/2022   ANIONGAP 6 04/03/2019   Lab Results  Component Value Date   WBC 8.3 05/14/2022   HGB 12.2 05/14/2022   HCT 36.2 05/14/2022   MCV 89.2 05/14/2022   PLT 260 05/14/2022   Lab Results  Component Value Date   TSH 2.09 05/14/2022    Patient Active Problem List   Diagnosis Date Noted   Body aches  05/06/2022   Shortness of breath 05/06/2022   Fever and chills 05/06/2022   Conjunctivitis 09/24/2021   Restless legs 03/18/2021   Myalgia 03/18/2021   Stress reaction 08/18/2020   Bruising 01/30/2020   Liver lesion 01/15/2020   Screening mammogram, encounter for 10/18/2018   Vitamin D deficiency 08/22/2018   Encounter for routine gynecological examination 02/02/2017   Neck pain 11/09/2016   Eczema 01/29/2015   Health maintenance examination 05/16/2014   Anxiety disorder 03/19/2013   Left ovarian cyst 03/07/2013   ADD (attention deficit disorder) 02/29/2012   Cough 05/13/2011   Sore throat 05/10/2011   Fibromyalgia 03/26/2011   Myofascial pain 03/19/2011   Gluten intolerance 01/22/2011   Overweight (BMI 25.0-29.9) 11/09/2010   Routine general medical examination at a health care facility 10/28/2010   Fatigue 05/18/2010   FIBROCYSTIC BREAST DISEASE 03/27/2010   CAVERNOUS HEMANGIOMA, LIVER 12/03/2009   Personal history of colonic polyps 12/03/2009   Allergic rhinitis 03/09/2008   URINARY FREQUENCY, CHRONIC 01/31/2007   Vitamin B12 deficiency 10/28/2006   GERD 04/27/2006   RENAL CALCULUS, HX OF 04/27/2006   Migraine syndrome 04/27/2006   Past Medical History:  Diagnosis Date   ADD (attention deficit disorder)    Complication of anesthesia    Endometriosis    Family history of malignant neoplasm of gastrointestinal tract    Fatigue    Fibromyalgia    GERD (gastroesophageal reflux disease)    Kidney stone    Migraine    Personal history of colonic polyps 06/01/2006   sessil serrated adenoma   PONV (postoperative nausea and vomiting)    Vitamin B12 deficiency    Past Surgical History:  Procedure Laterality Date   ABDOMINAL HYSTERECTOMY     BREAST SURGERY  2004   breast reduction   CHOLECYSTECTOMY     LAPAROSCOPY  11/2004   endometriosis   OVARIAN CYST SURGERY     REDUCTION MAMMAPLASTY     ROBOTIC ASSISTED TOTAL HYSTERECTOMY Bilateral 04/10/2019   Procedure: XI  ROBOTIC ASSISTED TOTAL HYSTERECTOMY AND SALPINGECTOMY AND RIGHT OVARIAN CYSTECTOMY;  Surgeon: Willodean Rosenthal, MD;  Location: [facility];  Service: Gynecology;  Laterality: Bilateral;   TUBAL LIGATION  2008   WISDOM TOOTH EXTRACTION     with four other teeth as well   Social History   Tobacco Use   Smoking status: Former    Packs/day: .25    Types: Cigarettes    Quit date: 01/25/2005    Years since quitting: 17.3   Smokeless tobacco: Never  Vaping Use   Vaping Use: Never used  Substance Use Topics   Alcohol use: Yes    Comment: occasionally   Drug use: No   Family History  Problem Relation Age of Onset   Cervical cancer Mother 55   Hypertension Mother    Diabetes Mother    Kidney cancer Mother 67   Cancer Mother        Melanoma   Kidney failure Brother 60       s/p transplant ?due to PCKD or renal stones   Wilson's disease Father        liver transplant   Ulcerative colitis Father    Liver disease Father    Colon polyps Sister    Breast cancer Maternal Aunt    Breast cancer Maternal Grandmother    Colon cancer Paternal Grandfather    Esophageal cancer Neg Hx    Stomach cancer Neg Hx    Rectal cancer Neg Hx    Allergies  Allergen Reactions   Strawberry Extract Anaphylaxis and Hives   Clindamycin Hives    angioedema   Gabapentin     Dizzy/ worse headache   Morphine And Related     Agitated and Angry   Penicillins Hives    Angioedema Did it involve swelling of the face/tongue/throat, SOB, or low BP? No Did it involve sudden or severe rash/hives, skin peeling, or any reaction on the inside of your mouth or nose? No Did you need to seek medical attention at a hospital or doctor's office? No When did it last happen?   5 or 42 years old If all above answers are "NO", may proceed with cephalosporin use.    Doxycycline Rash   Sulfa Antibiotics Rash   Current Outpatient Medications on File Prior to Visit  Medication Sig Dispense Refill    albuterol (VENTOLIN HFA) 108 (90 Base) MCG/ACT inhaler Inhale 2 puffs into the lungs every 6 (six) hours as needed for wheezing or shortness of breath. 8 g 0   Cholecalciferol (VITAMIN D3) 50 MCG (2000 UT) capsule Take 4,000 Units by mouth daily.     fluticasone (FLONASE) 50 MCG/ACT nasal spray Place 2 sprays into both nostrils daily. 16 g 6   Rimegepant Sulfate (NURTEC) 75 MG TBDP Dissolve 1 tablet on tongue daily as needed. 8 tablet 11   Semaglutide-Weight Management 1.7 MG/0.75ML SOAJ Inject 1.7 mg into the skin once a week. (Patient not taking: Reported on 05/21/2022) 3 mL 0   No current facility-administered medications on file prior to visit.    Review of Systems  Constitutional:  Positive for fatigue. Negative for activity change, appetite change, fever and unexpected weight change.  HENT:  Positive for sore throat. Negative for congestion, ear pain, rhinorrhea and sinus pressure.   Eyes:  Negative for pain, redness and visual disturbance.  Respiratory:  Negative for cough, shortness of breath and wheezing.   Cardiovascular:  Negative for chest pain and palpitations.  Gastrointestinal:  Negative for abdominal pain, blood in stool, constipation and diarrhea.  Endocrine: Negative for polydipsia and polyuria.  Genitourinary:  Negative for dysuria, frequency and urgency.  Musculoskeletal:  Negative for arthralgias, back pain and myalgias.  Skin:  Negative for pallor and rash.  Allergic/Immunologic: Negative for environmental allergies.  Neurological:  Positive for headaches. Negative for dizziness and syncope.  Hematological:  Negative for adenopathy. Does not bruise/bleed easily.  Psychiatric/Behavioral:  Negative for decreased concentration and dysphoric mood. The patient is not nervous/anxious.        Objective:   Physical Exam Constitutional:      General: She is not in acute distress.    Appearance: Normal appearance. She is well-developed. She is not ill-appearing or  diaphoretic.  HENT:     Head: Normocephalic and atraumatic.     Right Ear: Tympanic membrane, ear canal and external ear normal.     Left Ear: Tympanic membrane, ear canal and external ear normal.     Nose: Nose normal. No congestion.     Mouth/Throat:     Mouth: Mucous membranes are moist.     Pharynx: Oropharynx is clear. No posterior oropharyngeal erythema.  Eyes:     General: No scleral icterus.    Extraocular Movements: Extraocular movements intact.     Conjunctiva/sclera: Conjunctivae normal.     Pupils: Pupils are equal, round, and reactive to light.  Neck:     Thyroid: No thyromegaly.     Vascular: No carotid bruit or JVD.  Cardiovascular:     Rate and Rhythm: Normal rate and regular rhythm.     Pulses: Normal pulses.     Heart sounds: Normal heart sounds.     No gallop.  Pulmonary:     Effort: Pulmonary effort is normal. No respiratory distress.     Breath sounds: Normal breath sounds. No wheezing.     Comments: Good air exch Chest:     Chest wall: No tenderness.  Abdominal:     General: Bowel sounds are normal. There is no distension or abdominal bruit.     Palpations: Abdomen is soft. There is no mass.     Tenderness: There is no abdominal tenderness.     Hernia: No hernia is present.  Genitourinary:    Comments: Breast and pelvic exams are done by gyn provider Musculoskeletal:        General: No tenderness. Normal range of motion.     Cervical back: Normal range of motion and neck supple. No rigidity. No muscular tenderness.     Right lower leg: No edema.     Left lower leg: No edema.     Comments: No kyphosis   Lymphadenopathy:     Cervical: No cervical adenopathy.  Skin:    General: Skin is warm and dry.     Coloration: Skin is not pale.     Findings: No erythema or rash.     Comments: Solar lentigines diffusely   Neurological:     Mental Status: She is alert. Mental status is at baseline.     Cranial Nerves: No cranial nerve deficit.     Motor: No  abnormal muscle tone.     Coordination: Coordination normal.     Gait: Gait normal.     Deep Tendon Reflexes: Reflexes are normal and symmetric.  Psychiatric:        Mood and Affect: Mood normal.        Cognition and Memory: Cognition and memory normal.           Assessment & Plan:   Problem List Items Addressed This Visit       Cardiovascular and Mediastinum  Migraine syndrome    Nurtec works well  Has failed triptans  Pending prior auth since her insurance suddenly stopped covering it         Other   Fatigue    Recent dx of mono Dong better Making slow progress      Overweight (BMI 25.0-29.9)    Has lost wt with semaglutide Recenlty tried to go up to 1.7-pending prior auth for coverage She is close to her wt loss goal      Personal history of colonic polyps    Unclear when she is sue for colonoscopy last 2013- and she also turns 43 in feb  She plans to call GI office to see if due now or then      Routine general medical examination at a health care facility - Primary    Reviewed health habits including diet and exercise and skin cancer prevention Reviewed appropriate screening tests for age  Also reviewed health mt list, fam hx and immunization status , as well as social and family history   See HPI Labs reviewed and ordered Pap neg 2019 /no hpv Also has a hysterectomy and saw gyn for reoutine f/u in oct  Likely due for colonoscopy-she plans to call GI ans ee about this Mammogram 09/2021 up to date  Encourage to start vit D for bone health PHQ score of 2 due to fatigue (but that may be from mono)       Vitamin B12 deficiency    Lab Results  Component Value Date   VITAMINB12 562 05/14/2022  This is therapetutic with oral supplementation       Vitamin D deficiency    Vitamin D level is therapeutic with current supplementation Disc importance of this to bone and overall health Last vitamin D Lab Results  Component Value Date   VD25OH 29 (L)  05/14/2022  This is too low  Encourage to start back on vit D3 4000 iu daily since she has not been taking regularly

## 2022-05-23 NOTE — Assessment & Plan Note (Signed)
Nurtec works well  Has failed triptans  Pending prior auth since her insurance suddenly stopped covering it

## 2022-05-23 NOTE — Assessment & Plan Note (Signed)
Unclear when she is sue for colonoscopy last 2013- and she also turns 43 in feb  She plans to call GI office to see if due now or then

## 2022-05-23 NOTE — Assessment & Plan Note (Signed)
Has lost wt with semaglutide Recenlty tried to go up to 1.7-pending prior auth for coverage She is close to her wt loss goal

## 2022-05-23 NOTE — Assessment & Plan Note (Signed)
Recent dx of mono Dong better Making slow progress

## 2022-05-23 NOTE — Assessment & Plan Note (Signed)
Vitamin D level is therapeutic with current supplementation Disc importance of this to bone and overall health Last vitamin D Lab Results  Component Value Date   VD25OH 29 (L) 05/14/2022   This is too low  Encourage to start back on vit D3 4000 iu daily since she has not been taking regularly

## 2022-05-23 NOTE — Assessment & Plan Note (Signed)
Reviewed health habits including diet and exercise and skin cancer prevention Reviewed appropriate screening tests for age  Also reviewed health mt list, fam hx and immunization status , as well as social and family history   See HPI Labs reviewed and ordered Pap neg 2019 /no hpv Also has a hysterectomy and saw gyn for reoutine f/u in oct  Likely due for colonoscopy-she plans to call GI ans ee about this Mammogram 09/2021 up to date  Encourage to start vit D for bone health PHQ score of 2 due to fatigue (but that may be from mono)

## 2022-05-23 NOTE — Assessment & Plan Note (Signed)
Lab Results  Component Value Date   VITAMINB12 562 05/14/2022   This is therapetutic with oral supplementation

## 2022-05-28 ENCOUNTER — Other Ambulatory Visit (HOSPITAL_COMMUNITY): Payer: Self-pay

## 2022-05-28 ENCOUNTER — Telehealth: Payer: Self-pay

## 2022-05-28 NOTE — Telephone Encounter (Signed)
Pharmacy Patient Advocate Encounter  Prior Authorization for NURTEC has been approved by [employer] (ins).     Effective dates: 05/21/2022 through 05/21/2023   Copay is $0.00

## 2022-05-28 NOTE — Telephone Encounter (Signed)
PA has been submitted and is pending determination, will be updated in additional encounter created. 

## 2022-05-28 NOTE — Telephone Encounter (Signed)
PA request received via provider/CMM for [facility] 1.7MG /0.75ML auto-injectors  PA has been submitted to [facility] and is pending additional questions/determination  Key: [id number]

## 2022-06-01 NOTE — Telephone Encounter (Signed)
Pharmacy Patient Advocate Encounter  Prior Authorization for [name] has been approved by [employer] (ins).    PA # [id number] Effective dates: 05/28/2022 through 05/28/2023

## 2022-06-02 NOTE — Telephone Encounter (Signed)
Patient notified by telephone that PA has been approved. Patient stated that she has already gotten the medication.

## 2022-06-07 ENCOUNTER — Telehealth: Payer: Self-pay

## 2022-06-24 ENCOUNTER — Telehealth: Payer: Commercial Managed Care - PPO | Admitting: Emergency Medicine

## 2022-06-24 DIAGNOSIS — B001 Herpesviral vesicular dermatitis: Secondary | ICD-10-CM

## 2022-06-24 MED ORDER — VALACYCLOVIR HCL 1 G PO TABS: 2000.0000 mg | ORAL_TABLET | Freq: Two times a day (BID) | ORAL | 1 refills | Status: AC

## 2022-06-24 NOTE — Patient Instructions (Signed)
  [name], thank you for joining Susan Parsons, NP for today's virtual visit.  While this provider is not your primary care provider (PCP), if your PCP is located in our provider database this encounter information will be shared with them immediately following your visit.   A [facility] MyChart account gives you access to today's visit and all your visits, tests, and labs performed at [facility] " click here if you don't have a [facility] MyChart account or go to [email or web address]  Consent: (Patient) [name] provided verbal consent for this virtual visit at the beginning of the encounter.  Current Medications:  Current Outpatient Medications:    valACYclovir (VALTREX) 1000 MG tablet, Take 2 tablets (2,000 mg total) by mouth 2 (two) times daily for 1 day., Disp: 4 tablet, Rfl: 1   albuterol (VENTOLIN HFA) 108 (90 Base) MCG/ACT inhaler, Inhale 2 puffs into the lungs every 6 (six) hours as needed for wheezing or shortness of breath., Disp: 8 g, Rfl: 0   Cholecalciferol (VITAMIN D3) 50 MCG (2000 UT) capsule, Take 4,000 Units by mouth daily., Disp: , Rfl:    fluticasone (FLONASE) 50 MCG/ACT nasal spray, Place 2 sprays into both nostrils daily., Disp: 16 g, Rfl: 6   Rimegepant Sulfate (NURTEC) 75 MG TBDP, Dissolve 1 tablet on tongue daily as needed., Disp: 8 tablet, Rfl: 11   Semaglutide-Weight Management 1.7 MG/0.75ML SOAJ, Inject 1.7 mg into the skin once a week. (Patient not taking: Reported on 05/21/2022), Disp: 3 mL, Rfl: 0   Medications ordered in this encounter:  Meds ordered this encounter  Medications   valACYclovir (VALTREX) 1000 MG tablet    Sig: Take 2 tablets (2,000 mg total) by mouth 2 (two) times daily for 1 day.    Dispense:  4 tablet    Refill:  1     *If you need refills on other medications prior to your next appointment, please contact your pharmacy*  Follow-Up: Call back or seek an in-person evaluation if the symptoms worsen or if  the condition fails to improve as anticipated.  [facility] [phone]   If you have been instructed to have an in-person evaluation today at a local Urgent Care facility, please use the link below. It will take you to a list of all of our available [facility], including address, phone number and hours of operation. Please do not delay care.  [facility]  If you or a family member do not have a primary care provider, use the link below to schedule a visit and establish care. When you choose a [facility] primary care physician or advanced practice provider, you gain a long-term partner in health. Find a Primary Care Provider  Learn more about [place]'s in-office and virtual care options: [facility] - Get Care Now

## 2022-06-24 NOTE — Progress Notes (Signed)
Virtual Visit Consent   [name], you are scheduled for a virtual visit with a [facility] provider today. Just as with appointments in the office, your consent must be obtained to participate. Your consent will be active for this visit and any virtual visit you may have with one of our providers in the next 365 days. If you have a MyChart account, a copy of this consent can be sent to you electronically.  As this is a virtual visit, video technology does not allow for your provider to perform a traditional examination. This may limit your provider's ability to fully assess your condition. If your provider identifies any concerns that need to be evaluated in person or the need to arrange testing (such as labs, EKG, etc.), we will make arrangements to do so. Although advances in technology are sophisticated, we cannot ensure that it will always work on either your end or our end. If the connection with a video visit is poor, the visit may have to be switched to a telephone visit. With either a video or telephone visit, we are not always able to ensure that we have a secure connection.  By engaging in this virtual visit, you consent to the provision of healthcare and authorize for your insurance to be billed (if applicable) for the services provided during this visit. Depending on your insurance coverage, you may receive a charge related to this service.  I need to obtain your verbal consent now. Are you willing to proceed with your visit today? [name] has provided verbal consent on 06/24/2022 for a virtual visit (video or telephone). Cathlyn Parsons, NP  Date: 06/24/2022 4:08 PM  Virtual Visit via Video Note   I, Cathlyn Parsons, connected with  [name]  ([id number], 1981-01-07) on 06/24/22 at  4:00 PM EDT by a video-enabled telemedicine application and verified that I am speaking with the correct person using two identifiers.  Location: Patient: Virtual Visit Location Patient:  Other: parked car in [place] Provider: Virtual Visit Location Provider: Home Office   I discussed the limitations of evaluation and management by telemedicine and the availability of in person appointments. The patient expressed understanding and agreed to proceed.    History of Present Illness: [name] is a 42 y.o. who identifies as a female who was assigned female at birth, and is being seen today for cold sores/fever blisters. Has a history of getting them but it has been a long time since she's had one. Was in the sun a lot in the last week and 3-4 popped up on her lower lip. Denies any other sx or concerns. Has taken valacyclovir for them in the past with success.   HPI: HPI  Problems:  Patient Active Problem List   Diagnosis Date Noted   Body aches 05/06/2022   Shortness of breath 05/06/2022   Fever and chills 05/06/2022   Conjunctivitis 09/24/2021   Restless legs 03/18/2021   Myalgia 03/18/2021   Stress reaction 08/18/2020   Bruising 01/30/2020   Liver lesion 01/15/2020   Screening mammogram, encounter for 10/18/2018   Vitamin D deficiency 08/22/2018   Encounter for routine gynecological examination 02/02/2017   Neck pain 11/09/2016   Eczema 01/29/2015   Health maintenance examination 05/16/2014   Anxiety disorder 03/19/2013   Left ovarian cyst 03/07/2013   ADD (attention deficit disorder) 02/29/2012   Cough 05/13/2011   Sore throat 05/10/2011   Fibromyalgia 03/26/2011   Myofascial pain 03/19/2011  Gluten intolerance 01/22/2011   Overweight (BMI 25.0-29.9) 11/09/2010   Routine general medical examination at a health care facility 10/28/2010   Fatigue 05/18/2010   FIBROCYSTIC BREAST DISEASE 03/27/2010   CAVERNOUS HEMANGIOMA, LIVER 12/03/2009   Personal history of colonic polyps 12/03/2009   Allergic rhinitis 03/09/2008   URINARY FREQUENCY, CHRONIC 01/31/2007   Vitamin B12 deficiency 10/28/2006   GERD 04/27/2006   RENAL CALCULUS, HX OF 04/27/2006   Migraine  syndrome 04/27/2006    Allergies:  Allergies  Allergen Reactions   Strawberry Extract Anaphylaxis and Hives   Clindamycin Hives    angioedema   Gabapentin     Dizzy/ worse headache   Morphine And Codeine     Agitated and Angry   Penicillins Hives    Angioedema Did it involve swelling of the face/tongue/throat, SOB, or low BP? No Did it involve sudden or severe rash/hives, skin peeling, or any reaction on the inside of your mouth or nose? No Did you need to seek medical attention at a hospital or doctor's office? No When did it last happen?   23 or 42 years old If all above answers are "NO", may proceed with cephalosporin use.    Doxycycline Rash   Sulfa Antibiotics Rash   Medications:  Current Outpatient Medications:    valACYclovir (VALTREX) 1000 MG tablet, Take 2 tablets (2,000 mg total) by mouth 2 (two) times daily for 1 day., Disp: 4 tablet, Rfl: 1   albuterol (VENTOLIN HFA) 108 (90 Base) MCG/ACT inhaler, Inhale 2 puffs into the lungs every 6 (six) hours as needed for wheezing or shortness of breath., Disp: 8 g, Rfl: 0   Cholecalciferol (VITAMIN D3) 50 MCG (2000 UT) capsule, Take 4,000 Units by mouth daily., Disp: , Rfl:    fluticasone (FLONASE) 50 MCG/ACT nasal spray, Place 2 sprays into both nostrils daily., Disp: 16 g, Rfl: 6   Rimegepant Sulfate (NURTEC) 75 MG TBDP, Dissolve 1 tablet on tongue daily as needed., Disp: 8 tablet, Rfl: 11   Semaglutide-Weight Management 1.7 MG/0.75ML SOAJ, Inject 1.7 mg into the skin once a week. (Patient not taking: Reported on 05/21/2022), Disp: 3 mL, Rfl: 0  Observations/Objective: Patient is well-developed, well-nourished in no acute distress.  Resting comfortably in her parked car.  Head is normocephalic, atraumatic.  No labored breathing.  Speech is clear and coherent with logical content.  Patient is alert and oriented at baseline.  Cold sores are not readily visible on video  Assessment and Plan: 1. Herpes labialis  Rx  valacyclovir with 1 refill for a future outbreak.   Follow Up Instructions: I discussed the assessment and treatment plan with the patient. The patient was provided an opportunity to ask questions and all were answered. The patient agreed with the plan and demonstrated an understanding of the instructions.  A copy of instructions were sent to the patient via MyChart unless otherwise noted below.    The patient was advised to call back or seek an in-person evaluation if the symptoms worsen or if the condition fails to improve as anticipated.  Time:  I spent 8 minutes with the patient via telehealth technology discussing the above problems/concerns.    Cathlyn Parsons, NP

## 2022-07-07 ENCOUNTER — Encounter: Payer: Self-pay | Admitting: Family Medicine

## 2022-07-07 MED ORDER — SEMAGLUTIDE-WEIGHT MANAGEMENT 1.7 MG/0.75ML ~~LOC~~ SOAJ: 1.7000 mg | SUBCUTANEOUS | 5 refills | Status: DC

## 2022-10-18 ENCOUNTER — Encounter: Payer: Self-pay | Admitting: Family Medicine

## 2022-10-20 MED ORDER — SEMAGLUTIDE-WEIGHT MANAGEMENT 1 MG/0.5ML ~~LOC~~ SOAJ: 1.0000 mg | SUBCUTANEOUS | 0 refills | Status: DC

## 2022-10-20 NOTE — Addendum Note (Signed)
Addended by: Roxy Manns A on: 10/20/2022 09:01 PM   Modules accepted: Orders

## 2022-10-21 ENCOUNTER — Other Ambulatory Visit: Payer: Self-pay | Admitting: Family Medicine

## 2022-10-22 MED ORDER — SEMAGLUTIDE-WEIGHT MANAGEMENT 1 MG/0.5ML ~~LOC~~ SOAJ: 1.0000 mg | SUBCUTANEOUS | 0 refills | Status: DC

## 2022-10-22 NOTE — Addendum Note (Signed)
Addended by: Lonia Blood on: 10/22/2022 03:42 PM   Modules accepted: Orders

## 2022-11-11 ENCOUNTER — Encounter: Payer: Self-pay | Admitting: Family Medicine

## 2022-11-11 NOTE — Telephone Encounter (Signed)
Form in your inbox

## 2022-11-15 NOTE — Telephone Encounter (Signed)
Done, putting in IN box

## 2022-11-15 NOTE — Telephone Encounter (Signed)
See mychart messages, new forms placed back in your inbox

## 2022-12-06 ENCOUNTER — Ambulatory Visit: Payer: Commercial Managed Care - PPO

## 2022-12-15 ENCOUNTER — Other Ambulatory Visit: Payer: Self-pay | Admitting: Medical Genetics

## 2022-12-15 DIAGNOSIS — Z006 Encounter for examination for normal comparison and control in clinical research program: Secondary | ICD-10-CM

## 2023-01-07 ENCOUNTER — Encounter: Payer: Self-pay | Admitting: Family Medicine

## 2023-01-10 MED ORDER — SEMAGLUTIDE-WEIGHT MANAGEMENT 0.5 MG/0.5ML ~~LOC~~ SOAJ: 0.5000 mg | SUBCUTANEOUS | 3 refills | Status: DC

## 2023-03-09 ENCOUNTER — Other Ambulatory Visit: Payer: Self-pay | Admitting: Family Medicine

## 2023-03-09 DIAGNOSIS — Z1231 Encounter for screening mammogram for malignant neoplasm of breast: Secondary | ICD-10-CM

## 2023-03-14 ENCOUNTER — Encounter: Payer: Self-pay | Admitting: Family Medicine

## 2023-03-15 NOTE — Telephone Encounter (Signed)
Who was her doctor? How old is she? Name/ dob? thanks

## 2023-03-16 DIAGNOSIS — Z1231 Encounter for screening mammogram for malignant neoplasm of breast: Secondary | ICD-10-CM

## 2023-03-17 NOTE — Telephone Encounter (Signed)
Prev message copied to pt's chart and sent to Dr. Milinda Antis

## 2023-03-17 NOTE — Telephone Encounter (Signed)
Patient mother in law has been scheduled. She was wanting to know if Dr. Milinda Antis would refill her heart medication in April, she stated that she will be out by then and will need it. She was wanting to know if she can schedule a visit just for that when the time comes for the refill. Please advise. Thank you!

## 2023-03-17 NOTE — Telephone Encounter (Signed)
I have no problem changing to me but it may be longer until my next new patient appointment  Please let he know

## 2023-03-18 NOTE — Telephone Encounter (Signed)
Looks like the mother in law was scheduled with a provider at [facility] not here; matt cables appt was cancelled. Not sure why. Does she need to be moved to Dr. Milinda Antis?

## 2023-03-18 NOTE — Telephone Encounter (Signed)
[name] sent to Amy to handle

## 2023-03-23 ENCOUNTER — Ambulatory Visit: Payer: Commercial Managed Care - PPO

## 2023-04-25 ENCOUNTER — Encounter: Payer: Self-pay | Admitting: Family Medicine

## 2023-04-26 MED ORDER — NURTEC 75 MG PO TBDP: 1.0000 | ORAL_TABLET | Freq: Every day | ORAL | 11 refills | Status: DC | PRN

## 2023-04-26 NOTE — Telephone Encounter (Signed)
 Last filled on 12/15/21 #8 tabs/ 11 refills.  CPE is scheduled on 06/01/23

## 2023-05-03 ENCOUNTER — Other Ambulatory Visit: Payer: Self-pay | Admitting: *Deleted

## 2023-05-03 MED ORDER — NURTEC 75 MG PO TBDP: 1.0000 | ORAL_TABLET | Freq: Every day | ORAL | 2 refills | Status: AC | PRN

## 2023-05-03 NOTE — Telephone Encounter (Signed)
 Received fax from [facility] pharmacy asking PCP to fill med with them per pt request.  Last filled on 04/26/23 #8 tabs/ 11 refills but [facility] [address]  CPE scheduled on 05/23/23

## 2023-05-19 ENCOUNTER — Ambulatory Visit (INDEPENDENT_AMBULATORY_CARE_PROVIDER_SITE_OTHER): Admitting: Radiology

## 2023-05-19 ENCOUNTER — Telehealth

## 2023-05-19 ENCOUNTER — Ambulatory Visit
Admission: RE | Admit: 2023-05-19 | Discharge: 2023-05-19 | Disposition: A | Discharge status: Home / Self Care | Source: Ambulatory Visit | Attending: Physician Assistant

## 2023-05-19 ENCOUNTER — Other Ambulatory Visit: Payer: Self-pay

## 2023-05-19 VITALS — BP 148/76 | HR 71 | Temp 97.7°F | Resp 16 | Ht 63.0 in | Wt 160.0 lb

## 2023-05-19 DIAGNOSIS — M545 Low back pain, unspecified: Secondary | ICD-10-CM

## 2023-05-19 MED ORDER — METHYLPREDNISOLONE SODIUM SUCC 125 MG IJ SOLR
60.0000 mg | Freq: Once | INTRAMUSCULAR | Status: AC
  Administered 2023-05-19: 60 mg via INTRAMUSCULAR

## 2023-05-19 NOTE — ED Provider Notes (Signed)
 [name]    CSN: [id number] Arrival date & time: 05/19/23  1427      History   Chief Complaint Chief Complaint  Patient presents with   Back Pain    Extreme back pain. - Entered by patient    HPI [name] is a 43 y.o. female.   HPI  She reports having left sided low back pain that started yesterday afternoon She states she this started yesterday as she was walking to her car and she developed severe, shooting pain She states this has continued today and pain is 8/10  She reports difficulty getting comfortable and laying down seemed to make it worse last night She denies radiation of pain anywhere, numbness, tingling, incontinence She reports some sensation of weakness in legs especially with position changes or when going up steps  She denies recent injuries, falls, trauma to the area  Interventions: She has taken Advil /Tylenol  combo, heating pad,  She reports improvement with heating pad but none with medications   She denies previous pain or symptoms similar to this in the past   Past Medical History:  Diagnosis Date   ADD (attention deficit disorder)    Complication of anesthesia    Endometriosis    Family history of malignant neoplasm of gastrointestinal tract    Fatigue    Fibromyalgia    GERD (gastroesophageal reflux disease)    Kidney stone    Migraine    Personal history of colonic polyps 06/01/2006   sessil serrated adenoma   PONV (postoperative nausea and vomiting)    Vitamin B12 deficiency     Patient Active Problem List   Diagnosis Date Noted   Body aches 05/06/2022   Shortness of breath 05/06/2022   Fever and chills 05/06/2022   Conjunctivitis 09/24/2021   Restless legs 03/18/2021   Myalgia 03/18/2021   Stress reaction 08/18/2020   Bruising 01/30/2020   Liver lesion 01/15/2020   Screening mammogram, encounter for 10/18/2018   Vitamin D  deficiency 08/22/2018   Encounter for routine gynecological examination 02/02/2017    Neck pain 11/09/2016   Eczema 01/29/2015   Health maintenance examination 05/16/2014   Anxiety disorder 03/19/2013   Left ovarian cyst 03/07/2013   ADD (attention deficit disorder) 02/29/2012   Cough 05/13/2011   Sore throat 05/10/2011   Fibromyalgia 03/26/2011   Myofascial pain 03/19/2011   Gluten intolerance 01/22/2011   Overweight (BMI 25.0-29.9) 11/09/2010   Routine general medical examination at a health care facility 10/28/2010   Fatigue 05/18/2010   FIBROCYSTIC BREAST DISEASE 03/27/2010   CAVERNOUS HEMANGIOMA, LIVER 12/03/2009   History of colonic polyps 12/03/2009   Allergic rhinitis 03/09/2008   URINARY FREQUENCY, CHRONIC 01/31/2007   Vitamin B12 deficiency 10/28/2006   GERD 04/27/2006   RENAL CALCULUS, HX OF 04/27/2006   Migraine syndrome 04/27/2006    Past Surgical History:  Procedure Laterality Date   ABDOMINAL HYSTERECTOMY     BREAST SURGERY  2004   breast reduction   CHOLECYSTECTOMY     LAPAROSCOPY  11/2004   endometriosis   OVARIAN CYST SURGERY     REDUCTION MAMMAPLASTY     ROBOTIC ASSISTED TOTAL HYSTERECTOMY Bilateral 04/10/2019   Procedure: XI ROBOTIC ASSISTED TOTAL HYSTERECTOMY AND SALPINGECTOMY AND RIGHT OVARIAN CYSTECTOMY;  Surgeon: Lenord Radon, MD;  Location: [facility];  Service: Gynecology;  Laterality: Bilateral;   TUBAL LIGATION  2008   WISDOM TOOTH EXTRACTION     with four other teeth as well    OB History  Gravida  2   Para  2   Term  1   Preterm  1   AB      Living  2      SAB      IAB      Ectopic      Multiple      Live Births  2            Home Medications    Prior to Admission medications   Medication Sig Start Date End Date Taking? Authorizing Provider  albuterol  (VENTOLIN  HFA) 108 (90 Base) MCG/ACT inhaler Inhale 2 puffs into the lungs every 6 (six) hours as needed for wheezing or shortness of breath. 05/06/22   Dorothe Gaster, NP  Cholecalciferol (VITAMIN D3) 50 MCG (2000  UT) capsule Take 4,000 Units by mouth daily.    [provider]  fluticasone  (FLONASE ) 50 MCG/ACT nasal spray Place 2 sprays into both nostrils daily. 12/13/20   Gaylyn Keas, Mary-Margaret, FNP  Rimegepant Sulfate (NURTEC) 75 MG TBDP Take 1 tablet (75 mg total) by mouth daily as needed. Dissolve 1 tablet on tongue daily as needed. 05/03/23   Tower, Manley Seeds, MD  Semaglutide -Weight Management 0.5 MG/0.5ML SOAJ Inject 0.5 mg into the skin once a week. 01/10/23   Tower, Manley Seeds, MD    Family History Family History  Problem Relation Age of Onset   Cervical cancer Mother 4   Hypertension Mother    Diabetes Mother    Kidney cancer Mother 70   Cancer Mother        Melanoma   Kidney failure Brother 75       s/p transplant ?due to PCKD or renal stones   Wilson's disease Father        liver transplant   Ulcerative colitis Father    Liver disease Father    Colon polyps Sister    Breast cancer Maternal Aunt    Breast cancer Maternal Grandmother    Colon cancer Paternal Grandfather    Esophageal cancer Neg Hx    Stomach cancer Neg Hx    Rectal cancer Neg Hx     Social History Social History   Tobacco Use   Smoking status: Former    Current packs/day: 0.00    Types: Cigarettes    Quit date: 01/25/2005    Years since quitting: 18.3   Smokeless tobacco: Never  Vaping Use   Vaping status: Never Used  Substance Use Topics   Alcohol use: Yes    Comment: occasionally   Drug use: No     Allergies   Strawberry extract, Clindamycin, Gabapentin , Morphine  and codeine , Penicillins, Doxycycline , and Sulfa  antibiotics   Review of Systems Review of Systems  Musculoskeletal:  Positive for back pain.     Physical Exam Triage Vital Signs ED Triage Vitals  Encounter Vitals Group     BP 05/19/23 1436 (!) 148/76     Systolic BP Percentile --      Diastolic BP Percentile --      Pulse Rate 05/19/23 1436 71     Resp 05/19/23 1436 16     Temp 05/19/23 1436 97.7 F (36.5 C)     Temp  Source 05/19/23 1436 Oral     SpO2 05/19/23 1436 98 %     Weight 05/19/23 1436 160 lb (72.6 kg)     Height 05/19/23 1436 5\' 3"  (1.6 m)     Head Circumference --      Peak Flow --  Pain Score 05/19/23 1502 8     Pain Loc --      Pain Education --      Exclude from Growth Chart --    No data found.  Updated Vital Signs BP (!) 148/76 (BP Location: Right Arm)   Pulse 71   Temp 97.7 F (36.5 C) (Oral)   Resp 16   Ht 5\' 3"  (1.6 m)   Wt 160 lb (72.6 kg)   LMP 04/03/2019 (Exact Date)   SpO2 98%   BMI 28.34 kg/m   Visual Acuity Right Eye Distance:   Left Eye Distance:   Bilateral Distance:    Right Eye Near:   Left Eye Near:    Bilateral Near:     Physical Exam Vitals reviewed.  Constitutional:      General: She is awake.     Appearance: Normal appearance. She is well-developed and well-groomed.  HENT:     Head: Normocephalic and atraumatic.  Pulmonary:     Effort: Pulmonary effort is normal.  Musculoskeletal:     Cervical back: Normal. Normal range of motion.     Thoracic back: No swelling, deformity, spasms or tenderness. Decreased range of motion.     Lumbar back: Spasms and tenderness present. No swelling or deformity. Decreased range of motion. Positive left straight leg raise test. Negative right straight leg raise test.     Right hip: Normal.     Left hip: Tenderness present. Decreased range of motion. Decreased strength.     Comments: Thoracic: lateral flexion limited with left flexion. Right side intact  Lateral rotation intact Lumbar: decreased ROM with regards to flexion and extension   Hips: decreased ROM on the left with regards to flexion, extension, abduction and adduction. Right ROM is intact She has 5/5 strength on the right with regard to hip flexion, abduction and adduction. 5/5 dorsiflexion and plantar flexion on the right as well Left side strength is 4-5/5 with regards to hip flexion, abduction and adduction. Plantar flexion and dorsiflexion on  the left is 4/5   Skin:    General: Skin is warm and dry.     Findings: No rash.  Neurological:     Mental Status: She is alert.  Psychiatric:        Behavior: Behavior is cooperative.      UC Treatments / Results  Labs (all labs ordered are listed, but only abnormal results are displayed) Labs Reviewed - No data to display  EKG   Radiology No results found.   Procedures Procedures (including critical care time)  Medications Ordered in UC Medications  methylPREDNISolone  sodium succinate (SOLU-MEDROL ) 125 mg/2 mL injection 60 mg (60 mg Intramuscular Given 05/19/23 1648)    Initial Impression / Assessment and Plan / UC Course  I have reviewed the triage vital signs and the nursing notes.  Pertinent labs & imaging results that were available during my care of the patient were reviewed by me and considered in my medical decision making (see chart for details).      Final Clinical Impressions(s) / UC Diagnoses   Final diagnoses:  Acute left-sided low back pain without sciatica   Patient presents today with concerns of left lower back pain that started yesterday.  She denies recent injury or trauma to the area.  She reports that there has been minimal relief with home measures and she is having difficulty with getting comfortable or sitting.  Negative lumbar spine imaging per radiology review.  Physical exam appears consistent  with likely muscular etiology.  Will provide Solu-Medrol  60 mg injection today to assist with discomfort.  Recommend alternating Tylenol  and ibuprofen  as needed for further pain management.  Recommend warm compresses, gentle stretches, massage as tolerated to further assist with symptoms.  ED return precautions reviewed and provided after visit summary.  Follow-up as needed     Discharge Instructions      Based on your symptoms and physical exam I believe the following is the cause of your concern today Back pain likely secondary to a strain of  your back muscles  I recommend the following at this time to help relieve that discomfort:  Rest Warm compresses to the area (20 minutes on, minimum of 30 minutes off) You can alternate Tylenol  and Ibuprofen  for pain management but Ibuprofen  is typically preferred to reduce inflammation.   Gentle stretches and exercises that I have included in your paperwork Try to reduce excess strain to the area and rest as much as possible  Wear supportive shoes and, if you must lift anything, use proper lifting techniques that spare your back.   If these measures do not lead to improvement in your symptoms over the next 2-4 weeks please follow up with your PCP for ongoing management. If you start to have increased symptoms you can always return to urgent care but for more severe symptoms such as severe pain, numbness or weakness in one-sided body, difficulty walking, incontinence please go to the emergency room as these could be signs of a medical emergency.      ED Prescriptions   None    PDMP not reviewed this encounter.   Jerona Mooring, PA-C 05/23/23 1141

## 2023-05-19 NOTE — Discharge Instructions (Signed)
 Based on your symptoms and physical exam I believe the following is the cause of your concern today Back pain likely secondary to a strain of your back muscles  I recommend the following at this time to help relieve that discomfort:  Rest Warm compresses to the area (20 minutes on, minimum of 30 minutes off) You can alternate Tylenol  and Ibuprofen  for pain management but Ibuprofen  is typically preferred to reduce inflammation.   Gentle stretches and exercises that I have included in your paperwork Try to reduce excess strain to the area and rest as much as possible  Wear supportive shoes and, if you must lift anything, use proper lifting techniques that spare your back.   If these measures do not lead to improvement in your symptoms over the next 2-4 weeks please follow up with your PCP for ongoing management. If you start to have increased symptoms you can always return to urgent care but for more severe symptoms such as severe pain, numbness or weakness in one-sided body, difficulty walking, incontinence please go to the emergency room as these could be signs of a medical emergency.

## 2023-05-19 NOTE — ED Triage Notes (Signed)
 Pt presents with complaints of left lower back pain x 1 day. Pt denies recent injury to area. Pt currently rates her overall pain an 8/10. OTC Ibuprofen  with Tylenol  taken, heating pad + ice pack applied with no relief.

## 2023-05-23 ENCOUNTER — Telehealth: Payer: Self-pay | Admitting: Pharmacy Technician

## 2023-05-23 ENCOUNTER — Other Ambulatory Visit (HOSPITAL_COMMUNITY): Payer: Self-pay

## 2023-05-23 ENCOUNTER — Encounter: Admitting: Family Medicine

## 2023-05-23 NOTE — Telephone Encounter (Signed)
 Pharmacy Patient Advocate Encounter  Received notification from [facility] that Prior Authorization for Nurtec 75MG  dispersible tablets has been APPROVED from 05/23/2023 to 05/22/2024. Unable to obtain price due to refill too soon rejection, last fill date 05/23/2023 next available fill date05/08/2023   PA #/Case ID/Reference #: [id number]

## 2023-05-23 NOTE — Telephone Encounter (Signed)
 Pharmacy Patient Advocate Encounter   Received notification from CoverMyMeds that prior authorization for Nurtec 75MG  dispersible tablets is required/requested.   Insurance verification completed.   The patient is insured through [facility] .   Per test claim: PA required; PA submitted to above mentioned insurance via CoverMyMeds Key/confirmation #/EOC [id number] Status is pending

## 2023-05-24 ENCOUNTER — Other Ambulatory Visit (HOSPITAL_COMMUNITY): Payer: Self-pay

## 2023-05-25 ENCOUNTER — Encounter: Payer: Self-pay | Admitting: Family Medicine

## 2023-05-26 MED ORDER — SEMAGLUTIDE-WEIGHT MANAGEMENT 0.5 MG/0.5ML ~~LOC~~ SOAJ: 0.5000 mg | SUBCUTANEOUS | 3 refills | Status: DC

## 2023-05-26 NOTE — Telephone Encounter (Signed)
 Last filled on 01/10/23 #2 mL/ 3 refills, pt wants it sent to [facility] pharmacy   CPE scheduled 06/03/23

## 2023-05-30 ENCOUNTER — Other Ambulatory Visit (HOSPITAL_COMMUNITY): Payer: Self-pay

## 2023-05-30 ENCOUNTER — Telehealth: Payer: Self-pay

## 2023-05-30 NOTE — Telephone Encounter (Signed)
 Pharmacy Patient Advocate Encounter  Received notification from [facility] that Prior Authorization for Wegovy  0.5 has been APPROVED from 05/30/23 to 05/29/24. Unable to obtain price due to refill too soon rejection, last fill date 05/30/23 next available fill date5/26/25   PA #/Case ID/Reference #: [id number]

## 2023-05-30 NOTE — Telephone Encounter (Signed)
 Pharmacy Patient Advocate Encounter   Received notification from Patient Pharmacy that prior authorization for Wegovy  0.5 is required/requested.   Insurance verification completed.   The patient is insured through [facility] .   Per test claim: PA required; PA submitted to above mentioned insurance via CoverMyMeds Key/confirmation #/EOC [id number] Status is pending

## 2023-06-01 ENCOUNTER — Encounter: Admitting: Family Medicine

## 2023-06-03 ENCOUNTER — Encounter: Admitting: Family Medicine

## 2023-06-06 ENCOUNTER — Telehealth: Payer: Self-pay | Admitting: Family Medicine

## 2023-06-06 ENCOUNTER — Encounter: Payer: Self-pay | Admitting: Family Medicine

## 2023-06-06 DIAGNOSIS — E559 Vitamin D deficiency, unspecified: Secondary | ICD-10-CM

## 2023-06-06 DIAGNOSIS — E538 Deficiency of other specified B group vitamins: Secondary | ICD-10-CM

## 2023-06-06 DIAGNOSIS — Z0000 Encounter for general adult medical examination without abnormal findings: Secondary | ICD-10-CM

## 2023-06-06 NOTE — Telephone Encounter (Signed)
 lvm for pt to call office to schedule appt.

## 2023-06-06 NOTE — Telephone Encounter (Signed)
-----   Message from Darcella Earnest sent at 06/06/2023  4:53 PM EDT ----- Regarding: Labs for Tuesday 5.13.25 Please put physical lab orders in future. Thank you, [place]

## 2023-06-07 ENCOUNTER — Other Ambulatory Visit (INDEPENDENT_AMBULATORY_CARE_PROVIDER_SITE_OTHER)

## 2023-06-07 DIAGNOSIS — Z0000 Encounter for general adult medical examination without abnormal findings: Secondary | ICD-10-CM

## 2023-06-07 DIAGNOSIS — E538 Deficiency of other specified B group vitamins: Secondary | ICD-10-CM | POA: Diagnosis not present

## 2023-06-07 DIAGNOSIS — E559 Vitamin D deficiency, unspecified: Secondary | ICD-10-CM | POA: Diagnosis not present

## 2023-06-08 ENCOUNTER — Ambulatory Visit: Payer: Self-pay | Admitting: Family Medicine

## 2023-06-08 LAB — COMPREHENSIVE METABOLIC PANEL WITH GFR
ALT: 11 U/L (ref 0–35)
AST: 12 U/L (ref 0–37)
Albumin: 4.4 g/dL (ref 3.5–5.2)
Alkaline Phosphatase: 47 U/L (ref 39–117)
BUN: 9 mg/dL (ref 6–23)
CO2: 26 meq/L (ref 19–32)
Calcium: 9.3 mg/dL (ref 8.4–10.5)
Chloride: 100 meq/L (ref 96–112)
Creatinine, Ser: 0.75 mg/dL (ref 0.40–1.20)
GFR: 96.25 mL/min (ref >60.00)
Glucose, Bld: 82 mg/dL (ref 70–99)
Potassium: 3.8 meq/L (ref 3.5–5.1)
Sodium: 136 meq/L (ref 135–145)
Total Bilirubin: 0.6 mg/dL (ref 0.2–1.2)
Total Protein: 6.8 g/dL (ref 6.0–8.3)

## 2023-06-08 LAB — CBC WITH DIFFERENTIAL/PLATELET
Basophils Absolute: 0 10*3/uL (ref 0.0–0.1)
Basophils Relative: 0.5 % (ref 0.0–3.0)
Eosinophils Absolute: 0.1 10*3/uL (ref 0.0–0.7)
Eosinophils Relative: 1.8 % (ref 0.0–5.0)
HCT: 39.8 % (ref 36.0–46.0)
Hemoglobin: 13.3 g/dL (ref 12.0–15.0)
Lymphocytes Relative: 33 % (ref 12.0–46.0)
Lymphs Abs: 2.1 10*3/uL (ref 0.7–4.0)
MCHC: 33.4 g/dL (ref 30.0–36.0)
MCV: 93.8 fl (ref 78.0–100.0)
Monocytes Absolute: 0.9 10*3/uL (ref 0.1–1.0)
Monocytes Relative: 14 % — ABNORMAL HIGH (ref 3.0–12.0)
Neutro Abs: 3.2 10*3/uL (ref 1.4–7.7)
Neutrophils Relative %: 50.7 % (ref 43.0–77.0)
Platelets: 243 10*3/uL (ref 150.0–400.0)
RBC: 4.24 Mil/uL (ref 3.87–5.11)
RDW: 13.6 % (ref 11.5–15.5)
WBC: 6.3 10*3/uL (ref 4.0–10.5)

## 2023-06-08 LAB — TSH: TSH: 2.2 u[IU]/mL (ref 0.35–5.50)

## 2023-06-08 LAB — VITAMIN D 25 HYDROXY (VIT D DEFICIENCY, FRACTURES): VITD: 30.81 ng/mL (ref 30.00–100.00)

## 2023-06-08 LAB — VITAMIN B12: Vitamin B-12: 927 pg/mL — ABNORMAL HIGH (ref 211–911)

## 2023-06-08 LAB — LIPID PANEL
Cholesterol: 182 mg/dL (ref 0–200)
HDL: 71.6 mg/dL (ref >39.00)
LDL Cholesterol: 97 mg/dL (ref 0–99)
NonHDL: 110.16
Total CHOL/HDL Ratio: 3
Triglycerides: 68 mg/dL (ref 0.0–149.0)
VLDL: 13.6 mg/dL (ref 0.0–40.0)

## 2023-06-14 ENCOUNTER — Encounter: Admitting: Family Medicine

## 2023-06-29 ENCOUNTER — Encounter: Admitting: Family Medicine

## 2023-07-20 ENCOUNTER — Encounter: Admitting: Family Medicine

## 2023-08-19 ENCOUNTER — Encounter: Admitting: Family Medicine

## 2023-09-01 ENCOUNTER — Encounter: Admitting: Family Medicine

## 2023-09-02 ENCOUNTER — Other Ambulatory Visit: Payer: Self-pay | Admitting: Family Medicine

## 2023-09-02 DIAGNOSIS — Z1231 Encounter for screening mammogram for malignant neoplasm of breast: Secondary | ICD-10-CM

## 2023-09-11 ENCOUNTER — Other Ambulatory Visit: Payer: Self-pay | Admitting: Family Medicine

## 2023-09-12 NOTE — Telephone Encounter (Signed)
 CPE scheduled on 09/29/23  Last filled on 05/26/23 #2 mL/ 3 refills

## 2023-09-22 ENCOUNTER — Encounter

## 2023-09-22 DIAGNOSIS — Z1231 Encounter for screening mammogram for malignant neoplasm of breast: Secondary | ICD-10-CM

## 2023-09-29 ENCOUNTER — Ambulatory Visit

## 2023-09-29 ENCOUNTER — Encounter: Admitting: Family Medicine

## 2023-10-21 ENCOUNTER — Ambulatory Visit

## 2023-11-04 ENCOUNTER — Ambulatory Visit (INDEPENDENT_AMBULATORY_CARE_PROVIDER_SITE_OTHER): Admitting: Family Medicine

## 2023-11-04 VITALS — BP 126/78 | HR 74 | Temp 97.6°F | Ht 63.0 in | Wt 175.2 lb

## 2023-11-04 DIAGNOSIS — E669 Obesity, unspecified: Secondary | ICD-10-CM

## 2023-11-04 DIAGNOSIS — Z0000 Encounter for general adult medical examination without abnormal findings: Secondary | ICD-10-CM

## 2023-11-04 DIAGNOSIS — E538 Deficiency of other specified B group vitamins: Secondary | ICD-10-CM

## 2023-11-04 DIAGNOSIS — E559 Vitamin D deficiency, unspecified: Secondary | ICD-10-CM

## 2023-11-04 DIAGNOSIS — Z860100 Personal history of colon polyps, unspecified: Secondary | ICD-10-CM

## 2023-11-04 DIAGNOSIS — G43909 Migraine, unspecified, not intractable, without status migrainosus: Secondary | ICD-10-CM

## 2023-11-04 DIAGNOSIS — F419 Anxiety disorder, unspecified: Secondary | ICD-10-CM

## 2023-11-04 DIAGNOSIS — Z1211 Encounter for screening for malignant neoplasm of colon: Secondary | ICD-10-CM

## 2023-11-04 MED ORDER — UBRELVY 100 MG PO TABS: 100.0000 mg | ORAL_TABLET | Freq: Every day | ORAL | 1 refills | Status: AC | PRN

## 2023-11-04 NOTE — Patient Instructions (Addendum)
 Call to schedule your screening colonoscopy  [facility]  [phone]  Get your mammogram as planned   A probiotic over the counter and fiber supplement like benefiber or metamucil may help the loose stool    Keep walking  Add some strength training to your routine, this is important for bone and brain health and can reduce your risk of falls and help your body use insulin properly and regulate weight  Light weights, exercise bands , and internet videos are a good way to start  Yoga (chair or regular), machines , floor exercises or a gym with machines are also good options   Check with the pharmacist about the nurtec  If it is not covered ask about ubrevly - here is a prescription (? If this would be covered better)   Let us  know

## 2023-11-04 NOTE — Progress Notes (Signed)
 Subjective:    Patient ID: [name], female    DOB: 1980/03/17, 43 y.o.   MRN: [id number]  HPI  Here for health maintenance exam and to review chronic medical problems   Wt Readings from Last 3 Encounters:  11/04/23 175 lb 4 oz (79.5 kg)  05/19/23 160 lb (72.6 kg)  05/21/22 153 lb 4 oz (69.5 kg)   31.04 kg/m  Vitals:   11/04/23 1612  BP: 126/78  Pulse: 74  Temp: 97.6 F (36.4 C)  SpO2: 99%    Immunization History  Administered Date(s) Administered   Influenza Split 11/09/2010   Influenza-Unspecified 10/26/1999, 10/25/2012   MMR 10/26/2010   Td 01/25/1997, 02/06/2008, 02/18/2020   Tdap 04/26/2010    Health Maintenance Due  Topic Date Due   Hepatitis C Screening  Never done   Pneumococcal Vaccine (1 of 2 - PCV) Never done   Hepatitis B Vaccines 19-59 Average Risk (1 of 3 - 19+ 3-dose series) Never done   HPV VACCINES (1 - Risk 3-dose SCDM series) Never done   Mammogram  10/16/2022   Colonoscopy  03/01/2023   Doing ok    Flu shot -declines   Mammogram - mobile mammo at job, has next one in November  Self breast exam- no lumps   Gyn health Sees gyn provider  Hysterectomy    Colon cancer screening  Colonoscopy 01/2011  Loose stool ever since she took wegovy  Ccy in past   Was vegetarian  Went back to eating meat  Tries to eat protein with each meal  Not a lot of fruit    Bone health   Falls-none  Fractures-none  Supplements -D3 Last vitamin D  Lab Results  Component Value Date   VD25OH 30.81 06/07/2023    Exercise  Walks every day at least 2 miles (not running)  Zumba or other program  Looking into pilates  Weights     Mood    11/04/2023    4:17 PM 05/21/2022    3:16 PM 05/06/2022    9:02 AM 07/17/2021    2:32 PM 05/18/2021    4:02 PM  Depression screen PHQ 2/9  Decreased Interest 0 0 0 0 0  Down, Depressed, Hopeless 0 0 0 0 0  PHQ - 2 Score 0 0 0 0 0  Altered sleeping 0 0 1  0  Tired, decreased energy 2 1 1  1   Change  in appetite 1 1 0  1  Feeling bad or failure about yourself  0 0 0  0  Trouble concentrating 0 0 0  0  Moving slowly or fidgety/restless 0 0 0  0  Suicidal thoughts 0 0 0  0  PHQ-9 Score 3 2 2  2   Difficult doing work/chores Not difficult at all Not difficult at all Not difficult at all  Somewhat difficult   No medicines for anx or depression  Doing very well overall   Son left home and got married She is adjusting  Has learned to take care of herself and take vacations    Vit B12 def Lab Results  Component Value Date   VITAMINB12 927 (H) 06/07/2023    Vit D def Last vitamin D  Lab Results  Component Value Date   VD25OH 30.81 06/07/2023   4000 international units D3 daily (with K)   Cholesterol  Lab Results  Component Value Date   CHOL 182 06/07/2023   CHOL 190 05/14/2022   CHOL 204 (H) 05/18/2021   Lab  Results  Component Value Date   HDL 71.60 06/07/2023   HDL 73 05/14/2022   HDL 96.10 05/18/2021   Lab Results  Component Value Date   LDLCALC 97 06/07/2023   LDLCALC 101 (H) 05/14/2022   LDLCALC 95 05/18/2021   Lab Results  Component Value Date   TRIG 68.0 06/07/2023   TRIG 69 05/14/2022   TRIG 67.0 05/18/2021   Lab Results  Component Value Date   CHOLHDL 3 06/07/2023   CHOLHDL 2.6 05/14/2022   CHOLHDL 2 05/18/2021   No results found for: LDLDIRECT  Lab Results  Component Value Date   NA 136 06/07/2023   K 3.8 06/07/2023   CO2 26 06/07/2023   GLUCOSE 82 06/07/2023   BUN 9 06/07/2023   CREATININE 0.75 06/07/2023   CALCIUM 9.3 06/07/2023   GFR 96.25 06/07/2023   GFRNONAA 113 01/14/2020   Lab Results  Component Value Date   ALT 11 06/07/2023   AST 12 06/07/2023   ALKPHOS 47 06/07/2023   BILITOT 0.6 06/07/2023    Lab Results  Component Value Date   TSH 2.20 06/07/2023      Patient Active Problem List   Diagnosis Date Noted   Obesity (BMI 30-39.9) 11/06/2023   Colon cancer screening 11/04/2023   Restless legs 03/18/2021   Stress  reaction 08/18/2020   Liver lesion 01/15/2020   Screening mammogram, encounter for 10/18/2018   Vitamin D  deficiency 08/22/2018   Encounter for routine gynecological examination 02/02/2017   Neck pain 11/09/2016   Eczema 01/29/2015   Health maintenance examination 05/16/2014   Anxiety disorder 03/19/2013   Left ovarian cyst 03/07/2013   ADD (attention deficit disorder) 02/29/2012   Fibromyalgia 03/26/2011   Myofascial pain 03/19/2011   Gluten intolerance 01/22/2011   Routine general medical examination at a health care facility 10/28/2010   Fatigue 05/18/2010   FIBROCYSTIC BREAST DISEASE 03/27/2010   CAVERNOUS HEMANGIOMA, LIVER 12/03/2009   History of colonic polyps 12/03/2009   Allergic rhinitis 03/09/2008   URINARY FREQUENCY, CHRONIC 01/31/2007   Vitamin B12 deficiency 10/28/2006   GERD 04/27/2006   RENAL CALCULUS, HX OF 04/27/2006   Migraine syndrome 04/27/2006   Past Medical History:  Diagnosis Date   ADD (attention deficit disorder)    Complication of anesthesia    Endometriosis    Family history of malignant neoplasm of gastrointestinal tract    Fatigue    Fibromyalgia    GERD (gastroesophageal reflux disease)    Kidney stone    Migraine    Personal history of colonic polyps 06/01/2006   sessil serrated adenoma   PONV (postoperative nausea and vomiting)    Vitamin B12 deficiency    Past Surgical History:  Procedure Laterality Date   ABDOMINAL HYSTERECTOMY     BREAST SURGERY  2004   breast reduction   CHOLECYSTECTOMY     LAPAROSCOPY  11/2004   endometriosis   OVARIAN CYST SURGERY     REDUCTION MAMMAPLASTY     ROBOTIC ASSISTED TOTAL HYSTERECTOMY Bilateral 04/10/2019   Procedure: XI ROBOTIC ASSISTED TOTAL HYSTERECTOMY AND SALPINGECTOMY AND RIGHT OVARIAN CYSTECTOMY;  Surgeon: Corene Coy, MD;  Location: [facility];  Service: Gynecology;  Laterality: Bilateral;   TUBAL LIGATION  2008   WISDOM TOOTH EXTRACTION     with four other teeth  as well   Social History   Tobacco Use   Smoking status: Former    Current packs/day: 0.00    Types: Cigarettes    Quit date: 01/25/2005  Years since quitting: 18.7   Smokeless tobacco: Never  Vaping Use   Vaping status: Never Used  Substance Use Topics   Alcohol use: Yes    Alcohol/week: 3.0 standard drinks of alcohol    Types: 3 Standard drinks or equivalent per week    Comment: Wine occasionally   Drug use: No   Family History  Problem Relation Age of Onset   Cervical cancer Mother 34   Hypertension Mother    Diabetes Mother    Kidney cancer Mother 93   Cancer Mother        Melanoma   Kidney failure Brother 53       s/p transplant ?due to PCKD or renal stones   Early death Brother    Kidney disease Brother    Wilson's disease Father        liver transplant   Ulcerative colitis Father    Liver disease Father    Kidney disease Father    Obesity Father    Colon polyps Sister    Kidney disease Sister    Obesity Sister    Breast cancer Maternal Aunt    Breast cancer Maternal Grandmother    Obesity Maternal Grandmother    Colon cancer Paternal Grandfather    Cancer Paternal Grandfather    Varicose Veins Paternal Grandmother    Kidney disease Paternal Grandmother    Esophageal cancer Neg Hx    Stomach cancer Neg Hx    Rectal cancer Neg Hx    Allergies  Allergen Reactions   Strawberry Extract Anaphylaxis and Hives   Clindamycin Hives    angioedema   Gabapentin      Dizzy/ worse headache   Morphine  And Codeine      Agitated and Angry   Penicillins Hives    Angioedema Did it involve swelling of the face/tongue/throat, SOB, or low BP? No Did it involve sudden or severe rash/hives, skin peeling, or any reaction on the inside of your mouth or nose? No Did you need to seek medical attention at a hospital or doctor's office? No When did it last happen?   50 or 43 years old If all above answers are NO, may proceed with cephalosporin use.    Doxycycline  Rash    Sulfa  Antibiotics Rash   Current Outpatient Medications on File Prior to Visit  Medication Sig Dispense Refill   Cholecalciferol (VITAMIN D3) 50 MCG (2000 UT) capsule Take 4,000 Units by mouth daily.     fluticasone  (FLONASE ) 50 MCG/ACT nasal spray Place 2 sprays into both nostrils daily. 16 g 6   Rimegepant Sulfate (NURTEC) 75 MG TBDP Take 1 tablet (75 mg total) by mouth daily as needed. Dissolve 1 tablet on tongue daily as needed. 24 tablet 2   WEGOVY  0.5 MG/0.5ML SOAJ SQ injection Inject 0.5 mg under the skin once weekly. (Patient not taking: Reported on 11/04/2023) 2 mL 2   No current facility-administered medications on file prior to visit.    Review of Systems  Constitutional:  Negative for activity change, appetite change, fatigue, fever and unexpected weight change.  HENT:  Negative for congestion, ear pain, rhinorrhea, sinus pressure and sore throat.   Eyes:  Negative for pain, redness and visual disturbance.  Respiratory:  Negative for cough, shortness of breath and wheezing.   Cardiovascular:  Negative for chest pain and palpitations.  Gastrointestinal:  Negative for abdominal pain, blood in stool, constipation and diarrhea.  Endocrine: Negative for polydipsia and polyuria.  Genitourinary:  Negative for dysuria, frequency and  urgency.  Musculoskeletal:  Positive for myalgias. Negative for arthralgias and back pain.  Skin:  Negative for pallor and rash.  Allergic/Immunologic: Negative for environmental allergies.  Neurological:  Positive for headaches. Negative for dizziness and syncope.  Hematological:  Negative for adenopathy. Does not bruise/bleed easily.  Psychiatric/Behavioral:  Negative for decreased concentration and dysphoric mood. The patient is not nervous/anxious.        Objective:   Physical Exam Constitutional:      General: She is not in acute distress.    Appearance: Normal appearance. She is well-developed. She is obese. She is not ill-appearing or  diaphoretic.  HENT:     Head: Normocephalic and atraumatic.     Right Ear: Tympanic membrane, ear canal and external ear normal.     Left Ear: Tympanic membrane, ear canal and external ear normal.     Nose: Nose normal. No congestion.     Mouth/Throat:     Mouth: Mucous membranes are moist.     Pharynx: Oropharynx is clear. No posterior oropharyngeal erythema.  Eyes:     General: No scleral icterus.    Extraocular Movements: Extraocular movements intact.     Conjunctiva/sclera: Conjunctivae normal.     Pupils: Pupils are equal, round, and reactive to light.  Neck:     Thyroid : No thyromegaly.     Vascular: No carotid bruit or JVD.  Cardiovascular:     Rate and Rhythm: Normal rate and regular rhythm.     Pulses: Normal pulses.     Heart sounds: Normal heart sounds.     No gallop.  Pulmonary:     Effort: Pulmonary effort is normal. No respiratory distress.     Breath sounds: Normal breath sounds. No wheezing.     Comments: Good air exch Chest:     Chest wall: No tenderness.  Abdominal:     General: Bowel sounds are normal. There is no distension or abdominal bruit.     Palpations: Abdomen is soft. There is no mass.     Tenderness: There is no abdominal tenderness.     Hernia: No hernia is present.  Genitourinary:    Comments: Breast and pelvic exam are done by gyn provider   Musculoskeletal:        General: No tenderness. Normal range of motion.     Cervical back: Normal range of motion and neck supple. No rigidity. No muscular tenderness.     Right lower leg: No edema.     Left lower leg: No edema.     Comments: No kyphosis   Lymphadenopathy:     Cervical: No cervical adenopathy.  Skin:    General: Skin is warm and dry.     Coloration: Skin is not pale.     Findings: No erythema or rash.  Neurological:     Mental Status: She is alert. Mental status is at baseline.     Cranial Nerves: No cranial nerve deficit.     Motor: No abnormal muscle tone.     Coordination:  Coordination normal.     Gait: Gait normal.     Deep Tendon Reflexes: Reflexes are normal and symmetric.  Psychiatric:        Mood and Affect: Mood normal.        Cognition and Memory: Cognition and memory normal.           Assessment & Plan:   Problem List Items Addressed This Visit       Cardiovascular and Mediastinum  Migraine syndrome   Copay went up with nurtec  This was working very well for her   Given printed prescription for ubrevly to try (chance it is covered?)  Pt will check with insurance and let us  know       Relevant Medications   Ubrogepant (UBRELVY) 100 MG TABS     Other   Vitamin D  deficiency   Vitamin D  level is therapeutic with current supplementation Disc importance of this to bone and overall health Last vitamin D  Lab Results  Component Value Date   VD25OH 30.81 06/07/2023   Continue 4000 international units D3 daily      Vitamin B12 deficiency   Lab Results  Component Value Date   VITAMINB12 927 (H) 06/07/2023   Continues current oral supplementation       Routine general medical examination at a health care facility - Primary   Reviewed health habits including diet and exercise and skin cancer prevention Reviewed appropriate screening tests for age  Also reviewed health mt list, fam hx and immunization status , as well as social and family history   See HPI Labs reviewed and ordered Health Maintenance  Topic Date Due   Hepatitis C Screening  Never done   Pneumococcal Vaccine (1 of 2 - PCV) Never done   Hepatitis B Vaccine (1 of 3 - 19+ 3-dose series) Never done   HPV Vaccine (1 - Risk 3-dose SCDM series) Never done   Breast Cancer Screening  10/16/2022   Colon Cancer Screening  03/01/2023   Flu Shot  04/24/2024*   COVID-19 Vaccine (1) 09/03/2025*   HIV Screening  10/17/2028*   DTaP/Tdap/Td vaccine (5 - Td or Tdap) 02/17/2030   Meningitis B Vaccine  Aged Out  *Topic was postponed. The date shown is not the original due date.     Declines flu shot  Sent for last mammo (has one planned next month)  Utd gyn care  GI referral done for colonoscopy  Discussed fall prevention, supplements and exercise for bone density  PHQ 3 -doing well overall without medication/some fatigue      Obesity (BMI 30-39.9)   Bmi 31.0 Not currently taking wegovy   Discussed how this problem influences overall health and the risks it imposes  Reviewed plan for weight loss with lower calorie diet (via better food choices (lower glycemic and portion control) along with exercise building up to or more than 30 minutes 5 days per week including some aerobic activity and strength training   Encouraged protein and strength training exercise       History of colonic polyps   GI/ colonoscopy referral done       Colon cancer screening   Due for screening colonoscopy   GI referral done       Relevant Orders   Ambulatory referral to Gastroenterology   Anxiety disorder   No current medicines Pt thinks she is doing well

## 2023-11-06 DIAGNOSIS — E669 Obesity, unspecified: Secondary | ICD-10-CM | POA: Insufficient documentation

## 2023-11-06 NOTE — Assessment & Plan Note (Addendum)
 Reviewed health habits including diet and exercise and skin cancer prevention Reviewed appropriate screening tests for age  Also reviewed health mt list, fam hx and immunization status , as well as social and family history   See HPI Labs reviewed and ordered Health Maintenance  Topic Date Due   Hepatitis C Screening  Never done   Pneumococcal Vaccine (1 of 2 - PCV) Never done   Hepatitis B Vaccine (1 of 3 - 19+ 3-dose series) Never done   HPV Vaccine (1 - Risk 3-dose SCDM series) Never done   Breast Cancer Screening  10/16/2022   Colon Cancer Screening  03/01/2023   Flu Shot  04/24/2024*   COVID-19 Vaccine (1) 09/03/2025*   HIV Screening  10/17/2028*   DTaP/Tdap/Td vaccine (5 - Td or Tdap) 02/17/2030   Meningitis B Vaccine  Aged Out  *Topic was postponed. The date shown is not the original due date.    Declines flu shot  Sent for last mammo (has one planned next month)  Utd gyn care  GI referral done for colonoscopy  Discussed fall prevention, supplements and exercise for bone density  PHQ 3 -doing well overall without medication/some fatigue

## 2023-11-06 NOTE — Assessment & Plan Note (Signed)
 Due for screening colonoscopy   GI referral done

## 2023-11-06 NOTE — Assessment & Plan Note (Signed)
 Bmi 31.0 Not currently taking wegovy   Discussed how this problem influences overall health and the risks it imposes  Reviewed plan for weight loss with lower calorie diet (via better food choices (lower glycemic and portion control) along with exercise building up to or more than 30 minutes 5 days per week including some aerobic activity and strength training   Encouraged protein and strength training exercise

## 2023-11-06 NOTE — Assessment & Plan Note (Signed)
 GI/ colonoscopy referral done

## 2023-11-06 NOTE — Assessment & Plan Note (Signed)
 No current medicines Pt thinks she is doing well

## 2023-11-06 NOTE — Assessment & Plan Note (Signed)
 Copay went up with nurtec  This was working very well for her   Given printed prescription for ubrevly to try (chance it is covered?)  Pt will check with insurance and let us  know

## 2023-11-06 NOTE — Assessment & Plan Note (Signed)
 Lab Results  Component Value Date   VITAMINB12 927 (H) 06/07/2023   Continues current oral supplementation

## 2023-11-06 NOTE — Assessment & Plan Note (Signed)
 Vitamin D  level is therapeutic with current supplementation Disc importance of this to bone and overall health Last vitamin D  Lab Results  Component Value Date   VD25OH 30.81 06/07/2023   Continue 4000 international units D3 daily

## 2023-11-07 ENCOUNTER — Encounter: Payer: Self-pay | Admitting: Family Medicine

## 2023-11-07 NOTE — Telephone Encounter (Signed)
 Form completed and placed in your inbox, pt will need to pick up forms because she has to sign it and also we have to get a waist cir.

## 2023-11-08 NOTE — Telephone Encounter (Signed)
Signed and in IN box 

## 2023-11-29 ENCOUNTER — Other Ambulatory Visit: Payer: Self-pay | Admitting: Medical Genetics

## 2023-11-29 DIAGNOSIS — Z006 Encounter for examination for normal comparison and control in clinical research program: Secondary | ICD-10-CM

## 2023-12-31 LAB — GENECONNECT MOLECULAR SCREEN: Genetic Analysis Overall Interpretation: NEGATIVE

## 2024-02-21 ENCOUNTER — Encounter: Payer: Self-pay | Admitting: Gastroenterology
